# Patient Record
Sex: Female | Born: 1955
Health system: Southern US, Community
[De-identification: ages and names within clinical notes are randomized; demographics above are authoritative.]

## PROBLEM LIST (undated history)

## (undated) DIAGNOSIS — R200 Anesthesia of skin: Secondary | ICD-10-CM

## (undated) DIAGNOSIS — H269 Unspecified cataract: Secondary | ICD-10-CM

## (undated) DIAGNOSIS — I350 Nonrheumatic aortic (valve) stenosis: Secondary | ICD-10-CM

## (undated) DIAGNOSIS — I10 Essential (primary) hypertension: Secondary | ICD-10-CM

## (undated) DIAGNOSIS — E119 Type 2 diabetes mellitus without complications: Secondary | ICD-10-CM

## (undated) DIAGNOSIS — G2581 Restless legs syndrome: Secondary | ICD-10-CM

## (undated) DIAGNOSIS — R351 Nocturia: Secondary | ICD-10-CM

## (undated) DIAGNOSIS — G4700 Insomnia, unspecified: Secondary | ICD-10-CM

## (undated) DIAGNOSIS — R011 Cardiac murmur, unspecified: Secondary | ICD-10-CM

## (undated) DIAGNOSIS — E782 Mixed hyperlipidemia: Secondary | ICD-10-CM

## (undated) DIAGNOSIS — M519 Unspecified thoracic, thoracolumbar and lumbosacral intervertebral disc disorder: Secondary | ICD-10-CM

## (undated) DIAGNOSIS — R0600 Dyspnea, unspecified: Secondary | ICD-10-CM

## (undated) HISTORY — DX: Unspecified thoracic, thoracolumbar and lumbosacral intervertebral disc disorder: M51.9

## (undated) HISTORY — DX: Type 2 diabetes mellitus without complications: E11.9

## (undated) HISTORY — DX: Essential (primary) hypertension: I10

## (undated) HISTORY — DX: Unspecified cataract: H26.9

## (undated) HISTORY — DX: Restless legs syndrome: G25.81

## (undated) HISTORY — DX: Cardiac murmur, unspecified: R01.1

## (undated) HISTORY — DX: Nonrheumatic aortic (valve) stenosis: I35.0

## (undated) HISTORY — DX: Mixed hyperlipidemia: E78.2

## (undated) HISTORY — PX: CARDIAC VALVE REPLACEMENT: SHX585

## (undated) HISTORY — PX: TOE SURGERY: SHX1073

---

## 2001-04-04 ENCOUNTER — Encounter: Payer: Self-pay | Admitting: Obstetrics and Gynecology

## 2001-04-04 ENCOUNTER — Ambulatory Visit (HOSPITAL_COMMUNITY): Admission: RE | Admit: 2001-04-04 | Discharge: 2001-04-04 | Payer: Self-pay | Admitting: Obstetrics and Gynecology

## 2004-08-28 ENCOUNTER — Ambulatory Visit (HOSPITAL_COMMUNITY): Admission: RE | Admit: 2004-08-28 | Discharge: 2004-08-28 | Payer: Self-pay | Admitting: Obstetrics and Gynecology

## 2004-09-07 ENCOUNTER — Encounter: Admission: RE | Admit: 2004-09-07 | Discharge: 2004-09-07 | Payer: Self-pay | Admitting: Obstetrics and Gynecology

## 2005-03-05 HISTORY — PX: COLONOSCOPY: SHX174

## 2005-10-16 ENCOUNTER — Ambulatory Visit: Payer: Self-pay | Admitting: Internal Medicine

## 2005-10-16 ENCOUNTER — Ambulatory Visit (HOSPITAL_COMMUNITY): Admission: RE | Admit: 2005-10-16 | Discharge: 2005-10-16 | Payer: Self-pay | Admitting: Internal Medicine

## 2005-10-17 ENCOUNTER — Ambulatory Visit (HOSPITAL_COMMUNITY): Admission: RE | Admit: 2005-10-17 | Discharge: 2005-10-17 | Payer: Self-pay | Admitting: Obstetrics and Gynecology

## 2006-06-24 ENCOUNTER — Emergency Department (HOSPITAL_COMMUNITY): Admission: EM | Admit: 2006-06-24 | Discharge: 2006-06-24 | Payer: Self-pay | Admitting: Emergency Medicine

## 2006-10-21 ENCOUNTER — Encounter: Admission: RE | Admit: 2006-10-21 | Discharge: 2006-10-21 | Payer: Self-pay | Admitting: Obstetrics and Gynecology

## 2007-10-22 ENCOUNTER — Ambulatory Visit (HOSPITAL_COMMUNITY): Admission: RE | Admit: 2007-10-22 | Discharge: 2007-10-22 | Payer: Self-pay | Admitting: Family Medicine

## 2008-07-14 ENCOUNTER — Ambulatory Visit (HOSPITAL_COMMUNITY): Admission: RE | Admit: 2008-07-14 | Discharge: 2008-07-14 | Payer: Self-pay | Admitting: Family Medicine

## 2008-10-27 ENCOUNTER — Ambulatory Visit (HOSPITAL_COMMUNITY): Admission: RE | Admit: 2008-10-27 | Discharge: 2008-10-27 | Payer: Self-pay | Admitting: Family Medicine

## 2009-11-03 ENCOUNTER — Other Ambulatory Visit: Admission: RE | Admit: 2009-11-03 | Discharge: 2009-11-03 | Payer: Self-pay | Admitting: Obstetrics and Gynecology

## 2009-11-03 ENCOUNTER — Ambulatory Visit (HOSPITAL_COMMUNITY): Admission: RE | Admit: 2009-11-03 | Discharge: 2009-11-03 | Payer: Self-pay | Admitting: Obstetrics and Gynecology

## 2010-07-18 ENCOUNTER — Ambulatory Visit (HOSPITAL_COMMUNITY)
Admission: RE | Admit: 2010-07-18 | Discharge: 2010-07-18 | Disposition: A | Discharge status: Home / Self Care | Payer: BC Managed Care – PPO | Source: Ambulatory Visit | Attending: Family Medicine | Admitting: Family Medicine

## 2010-07-18 DIAGNOSIS — E119 Type 2 diabetes mellitus without complications: Secondary | ICD-10-CM | POA: Insufficient documentation

## 2010-07-18 DIAGNOSIS — I10 Essential (primary) hypertension: Secondary | ICD-10-CM | POA: Insufficient documentation

## 2010-07-18 DIAGNOSIS — R011 Cardiac murmur, unspecified: Secondary | ICD-10-CM | POA: Insufficient documentation

## 2010-07-18 DIAGNOSIS — I359 Nonrheumatic aortic valve disorder, unspecified: Secondary | ICD-10-CM

## 2010-07-21 NOTE — Op Note (Signed)
[name], [name]                ACCOUNT NO.:  [id number]   MEDICAL RECORD NO.:  [id number]          PATIENT TYPE:  AMB   LOCATION:  DAY                           FACILITY:  [facility]   PHYSICIAN:  R. Roetta Sessions, M.D. DATE OF BIRTH:  [birth year]   DATE OF PROCEDURE:  10/16/2005  DATE OF DISCHARGE:                                 OPERATIVE REPORT   PROCEDURE:  Screening colonoscopy.   INDICATIONS FOR PROCEDURE:  The patient is a 55 year old Caucasian female  with no lower GI tract symptoms, sent over courtesy of Dr. Lilyan Punt for  colorectal cancer screening.  She is devoid of any, as stated above, lower  GI tract symptoms.  There is no family history of colorectal neoplasia.  She  has never had her lower GI tract imaged.  Colonoscopy is now being done as a  screening maneuver.  This procedure has been discussed with the patient at  length.  The potential risks, benefits, and alternatives have been reviewed.  Questions answered.  She is agreeable.  Please see documentation in the  medical record.   PROCEDURE NOTE:  O2 saturation, blood pressure, pulse and respiratory rate  monitored throughout the entire procedure.   CONSCIOUS SEDATION:  1. Versed 4 mg IV.  2. Demerol 75 mg IV in divided doses.   INSTRUMENT:  Olympus video chip system.   FINDINGS:  Digital exam revealed no abnormalities.  Endoscopic findings:  Prep was good.  Rectum:  Examination of the rectum mucosa including  retroflexed view of the anal verge revealed only a single anal papilla.  Rectal mucosa appeared normal.   Colonic mucosa was surveyed from the rectosigmoid junction, through the  left, transverse, and right colon, to the appendiceal orifice, ileocecal  valve, and cecum.  These structures were well seen and photographed for the  record.  From this level, the scope was slowly withdrawn.  All previously  mentioned mucosal surfaces were again seen.  The colonic mucosa was well  seen and appeared normal.   The patient tolerated the procedure well and was  reactivated.   ENDOSCOPIC IMPRESSION:  1. Single anal papilla.  Otherwise normal rectum.  2. Normal colon.   RECOMMENDATIONS:  Repeat colonoscopy in 10 years.      Jonathon Bellows, M.D.  Electronically Signed     RMR/MEDQ  D:  10/16/2005  T:  10/16/2005  Job:  [id number]   cc:   Lorin Picket A. Gerda Diss, MD  Fax: [phone]

## 2010-08-31 ENCOUNTER — Ambulatory Visit: Payer: BC Managed Care – PPO | Admitting: Cardiology

## 2010-09-05 DIAGNOSIS — E119 Type 2 diabetes mellitus without complications: Secondary | ICD-10-CM | POA: Insufficient documentation

## 2010-09-05 DIAGNOSIS — M519 Unspecified thoracic, thoracolumbar and lumbosacral intervertebral disc disorder: Secondary | ICD-10-CM

## 2010-09-05 DIAGNOSIS — E785 Hyperlipidemia, unspecified: Secondary | ICD-10-CM | POA: Insufficient documentation

## 2010-09-05 DIAGNOSIS — I350 Nonrheumatic aortic (valve) stenosis: Secondary | ICD-10-CM | POA: Insufficient documentation

## 2010-09-05 DIAGNOSIS — I10 Essential (primary) hypertension: Secondary | ICD-10-CM | POA: Insufficient documentation

## 2010-09-05 DIAGNOSIS — G2581 Restless legs syndrome: Secondary | ICD-10-CM

## 2010-09-11 ENCOUNTER — Encounter: Payer: Self-pay | Admitting: Cardiology

## 2010-09-11 ENCOUNTER — Ambulatory Visit (INDEPENDENT_AMBULATORY_CARE_PROVIDER_SITE_OTHER): Payer: BC Managed Care – PPO | Admitting: Cardiology

## 2010-09-11 VITALS — BP 156/88 | HR 92 | Ht 67.0 in | Wt 242.0 lb

## 2010-09-11 DIAGNOSIS — I10 Essential (primary) hypertension: Secondary | ICD-10-CM

## 2010-09-11 DIAGNOSIS — E782 Mixed hyperlipidemia: Secondary | ICD-10-CM

## 2010-09-11 DIAGNOSIS — I359 Nonrheumatic aortic valve disorder, unspecified: Secondary | ICD-10-CM

## 2010-09-11 DIAGNOSIS — E119 Type 2 diabetes mellitus without complications: Secondary | ICD-10-CM

## 2010-09-11 NOTE — Progress Notes (Signed)
Clinical Summary [name] is a 55 y.o.female referred for cardiology consultation by Dr. Gerda Diss. She was seen for a routine visit with finding of cardiac murmur, subsequent echocardiogram was obtained. Study results reviewed showing evidence of a functionally bicuspid aortic valve with mild aortic stenosis, no aortic regurgitation, and mean gradient of 22 mm mercury. LVEF was normal at 55-60% with normal chamber size. I discussed this in detail with the patient today.  From a symptom perspective, [name] does not endorse any exertional chest pain or dyspnea beyond NYHA class II. She does not exercise regularly, but states that she stays active with a catering business, also 5 grandchildren. She has no history of palpitations or syncope.  Today we discussed the general pathophysiology and expected natural history of aortic stenosis. We also discussed her cardiac risk factor profile, and potential lifestyle modifications.  No Known Allergies  Current outpatient prescriptions:glyBURIDE (DIABETA) 5 MG tablet, Take 5 mg by mouth 2 (two) times daily with a meal.  , Disp: , Rfl: ;  lisinopril-hydrochlorothiazide (PRINZIDE,ZESTORETIC) 20-25 MG per tablet, Take 1 tablet by mouth daily. , Disp: , Rfl: ;  LORazepam (ATIVAN) 0.5 MG tablet, Take 0.5 mg by mouth every 8 (eight) hours.  , Disp: , Rfl: ;  pravastatin (PRAVACHOL) 20 MG tablet, Take 20 mg by mouth daily.  , Disp: , Rfl:  sitaGLIPtan-metformin (JANUMET) 50-1000 MG per tablet, Take 1 tablet by mouth 2 (two) times daily with a meal.  , Disp: , Rfl: ;  DISCONTD: lisinopril (PRINIVIL,ZESTRIL) 20 MG tablet, Take 20 mg by mouth daily.  , Disp: , Rfl: ;  DISCONTD: metFORMIN (GLUCOPHAGE) 1000 MG tablet, Take 1,000 mg by mouth 2 (two) times daily with a meal.  , Disp: , Rfl: ;  DISCONTD: pioglitazone (ACTOS) 30 MG tablet, Take 30 mg by mouth daily.  , Disp: , Rfl:   Past Medical History  Diagnosis Date  . Aortic stenosis     Mild 5/12  . Type 2 diabetes  mellitus   . Essential hypertension, benign   . Mixed hyperlipidemia   . Restless leg syndrome   . Ruptured disk     Past Surgical History  Procedure Date  . Colonoscopy     Family History  Problem Relation Age of Onset  . Coronary artery disease Father     Diagnosed in his 58s  . Diabetes type II Mother   . Diabetes type II Brother     Social History [name] reports that she has never smoked. She has never used smokeless tobacco. [name] reports that she does not drink alcohol.  Review of Systems Stable appetite. No orthopnea or PND. No reported melena or hematochezia. No lower extremity edema. No claudication. Otherwise negative.  Physical Examination Filed Vitals:   09/11/10 0846  BP: 156/88  Pulse: 92  Obese woman in no acute distress. HEENT: Conjunctiva and lids normal, oropharynx with moist mucosa. Neck: Supple, no elevated JVP or carotid bruits, no thyromegaly. Lungs: Clear to auscultation, nonlabored. Cardiac: Regular rate and rhythm with 2-3/6 systolic murmur heard best at the right base, preserved second heart sound, no diastolic murmur or ejection click. No S3. No rub. Abdomen: Soft, nontender, bowel sounds present. Skin: Warm and dry. Musculoskeletal: No kyphosis. Extremities: No pitting edema, distal pulses full. Neuropsychiatric: Alert and oriented x3, affect appropriate.   ECG Normal sinus rhythm at 86 beats per minute.  Studies Echocardiogram 07/18/2010: - Left ventricle: The cavity size was normal. There was mild  concentric hypertrophy. Systolic function was normal. The     estimated ejection fraction was in the range of 55% to 60%. Wall     motion was normal; there were no regional wall motion     abnormalities.   - Aortic valve: There was mild stenosis. Valve area: 1.28cm^2(VTI).     Valve area: 1.38cm^2 (Vmax).   - Left atrium: The atrium was mildly dilated.   - Atrial septum: No defect or patent foramen ovale was  identified.   Problem List and Plan

## 2010-09-11 NOTE — Assessment & Plan Note (Signed)
Followed by Dr. Luking 

## 2010-09-11 NOTE — Assessment & Plan Note (Signed)
Mild with functionally bicuspid valve by recent echocardiogram. No significant symptomatology at this point. We discussed the natural history, also risk factor modification strategies and exercise. Followup echocardiogram with clinical visit scheduled for one year.

## 2010-09-11 NOTE — Assessment & Plan Note (Signed)
Blood pressure elevated today. She is on medical therapy followed by Dr. Gerda Diss. Recommended general diet and exercise, sodium restriction.

## 2010-09-11 NOTE — Patient Instructions (Signed)
Your physician has requested that you have an echocardiogram. Echocardiography is a painless test that uses sound waves to create images of your heart. It provides your doctor with information about the size and shape of your heart and how well your heart's chambers and valves are working. This procedure takes approximately one hour. There are no restrictions for this procedure.  Your physician recommends that you continue on your current medications as directed. Please refer to the Current Medication list given to you today.  Your physician recommends that you schedule a follow-up appointment in: 1 year

## 2010-10-12 ENCOUNTER — Encounter: Payer: Self-pay | Admitting: Cardiology

## 2010-10-24 ENCOUNTER — Other Ambulatory Visit: Payer: Self-pay | Admitting: Obstetrics and Gynecology

## 2010-10-24 DIAGNOSIS — Z139 Encounter for screening, unspecified: Secondary | ICD-10-CM

## 2010-11-07 ENCOUNTER — Ambulatory Visit (HOSPITAL_COMMUNITY)
Admission: RE | Admit: 2010-11-07 | Discharge: 2010-11-07 | Disposition: A | Discharge status: Home / Self Care | Payer: BC Managed Care – PPO | Source: Ambulatory Visit | Attending: Obstetrics and Gynecology | Admitting: Obstetrics and Gynecology

## 2010-11-07 DIAGNOSIS — Z1231 Encounter for screening mammogram for malignant neoplasm of breast: Secondary | ICD-10-CM | POA: Insufficient documentation

## 2010-11-07 DIAGNOSIS — Z139 Encounter for screening, unspecified: Secondary | ICD-10-CM

## 2011-09-20 ENCOUNTER — Ambulatory Visit (INDEPENDENT_AMBULATORY_CARE_PROVIDER_SITE_OTHER): Payer: BC Managed Care – PPO | Admitting: Cardiology

## 2011-09-20 ENCOUNTER — Encounter: Payer: Self-pay | Admitting: Cardiology

## 2011-09-20 VITALS — BP 112/74 | HR 111 | Ht 66.0 in | Wt 234.0 lb

## 2011-09-20 DIAGNOSIS — I350 Nonrheumatic aortic (valve) stenosis: Secondary | ICD-10-CM

## 2011-09-20 DIAGNOSIS — I359 Nonrheumatic aortic valve disorder, unspecified: Secondary | ICD-10-CM

## 2011-09-20 NOTE — Patient Instructions (Addendum)
Your physician has requested that you have an echocardiogram 09/2012 dx aortic stenosis. Echocardiography is a painless test that uses sound waves to create images of your heart. It provides your doctor with information about the size and shape of your heart and how well your heart's chambers and valves are working. This procedure takes approximately one hour. There are no restrictions for this procedure.  Your physician wants you to follow-up in: 1 year with Dr. Diona Browner. You will receive a reminder letter in the mail two months in advance. If you don't receive a letter, please call our office to schedule the follow-up appointment.  NO CHANGES WERE MADE TODAY

## 2011-09-20 NOTE — Progress Notes (Signed)
   Clinical Summary [name] is a 56 y.o.female presenting for followup. She was seen in July of 2012. She reports no progressive dyspnea, palpitations, or chest pain. Last echocardiogram reviewed from 2012.  She is now on insulin in addition to her regular medications. Keeps followup with Dr. Gerda Diss.  We decided to hold off until next year to repeat an echocardiogram.   No Known Allergies  Current Outpatient Prescriptions  Medication Sig Dispense Refill  . ACCU-CHEK AVIVA PLUS test strip       . ACCU-CHEK FASTCLIX LANCETS MISC       . B-D UF III MINI PEN NEEDLES 31G X 5 MM MISC       . glyBURIDE (DIABETA) 5 MG tablet Take 5 mg by mouth 2 (two) times daily with a meal.        . LANTUS SOLOSTAR 100 UNIT/ML injection 12 Units at bedtime.      Marland Kitchen lisinopril (PRINIVIL,ZESTRIL) 20 MG tablet Take 1 tablet by mouth Daily.      Marland Kitchen LORazepam (ATIVAN) 0.5 MG tablet Take 0.5 mg by mouth every 8 (eight) hours.        . metFORMIN (GLUCOPHAGE) 1000 MG tablet Take 1 tablet by mouth Twice daily.      . pravastatin (PRAVACHOL) 20 MG tablet Take 20 mg by mouth daily.          Past Medical History  Diagnosis Date  . Aortic stenosis     Mild 5/12  . Type 2 diabetes mellitus   . Essential hypertension, benign   . Mixed hyperlipidemia   . Restless leg syndrome   . Ruptured disk     Social History [name] reports that she has never smoked. She has never used smokeless tobacco. [name] reports that she does not drink alcohol.  Review of Systems Negative except as outlined above.  Physical Examination Filed Vitals:   09/20/11 1445  BP: 112/74  Pulse: 111    Obese woman in no acute distress.  HEENT: Conjunctiva and lids normal, oropharynx with moist mucosa.  Neck: Supple, no elevated JVP or carotid bruits, no thyromegaly.  Lungs: Clear to auscultation, nonlabored.  Cardiac: Regular rate and rhythm with 2-3/6 systolic murmur heard best at the right base, preserved second heart sound,  no diastolic murmur or ejection click. No S3. No rub.  Abdomen: Soft, nontender, bowel sounds present.   Extremities: No pitting edema, distal pulses full.    ECG Sinus tachycardia, no significant ST changes.  Problem List and Plan   No problem-specific assessment & plan notes found for this encounter.   Jonelle Sidle, M.D., F.A.C.C.

## 2011-09-20 NOTE — Assessment & Plan Note (Signed)
Mild with functionally bicuspid aortic valve. No progressive symptoms since last visit and exam is stable. We defer repeat echocardiogram to next visit in a year.

## 2011-12-10 ENCOUNTER — Other Ambulatory Visit: Payer: Self-pay | Admitting: Family Medicine

## 2011-12-10 DIAGNOSIS — Z139 Encounter for screening, unspecified: Secondary | ICD-10-CM

## 2011-12-13 ENCOUNTER — Ambulatory Visit (HOSPITAL_COMMUNITY)
Admission: RE | Admit: 2011-12-13 | Discharge: 2011-12-13 | Disposition: A | Discharge status: Home / Self Care | Payer: BC Managed Care – PPO | Source: Ambulatory Visit | Attending: Family Medicine | Admitting: Family Medicine

## 2011-12-13 DIAGNOSIS — Z1231 Encounter for screening mammogram for malignant neoplasm of breast: Secondary | ICD-10-CM | POA: Insufficient documentation

## 2011-12-13 DIAGNOSIS — Z139 Encounter for screening, unspecified: Secondary | ICD-10-CM

## 2012-03-20 ENCOUNTER — Encounter (HOSPITAL_COMMUNITY): Payer: Self-pay | Admitting: *Deleted

## 2012-03-20 ENCOUNTER — Emergency Department (HOSPITAL_COMMUNITY)
Admission: EM | Admit: 2012-03-20 | Discharge: 2012-03-20 | Disposition: A | Discharge status: Home / Self Care | Payer: BC Managed Care – PPO | Attending: Emergency Medicine | Admitting: Emergency Medicine

## 2012-03-20 DIAGNOSIS — K122 Cellulitis and abscess of mouth: Secondary | ICD-10-CM

## 2012-03-20 DIAGNOSIS — R05 Cough: Secondary | ICD-10-CM | POA: Insufficient documentation

## 2012-03-20 DIAGNOSIS — Z8679 Personal history of other diseases of the circulatory system: Secondary | ICD-10-CM | POA: Insufficient documentation

## 2012-03-20 DIAGNOSIS — E785 Hyperlipidemia, unspecified: Secondary | ICD-10-CM | POA: Insufficient documentation

## 2012-03-20 DIAGNOSIS — J069 Acute upper respiratory infection, unspecified: Secondary | ICD-10-CM

## 2012-03-20 DIAGNOSIS — Z8739 Personal history of other diseases of the musculoskeletal system and connective tissue: Secondary | ICD-10-CM | POA: Insufficient documentation

## 2012-03-20 DIAGNOSIS — R6883 Chills (without fever): Secondary | ICD-10-CM | POA: Insufficient documentation

## 2012-03-20 DIAGNOSIS — I10 Essential (primary) hypertension: Secondary | ICD-10-CM | POA: Insufficient documentation

## 2012-03-20 DIAGNOSIS — E119 Type 2 diabetes mellitus without complications: Secondary | ICD-10-CM | POA: Insufficient documentation

## 2012-03-20 DIAGNOSIS — Z79899 Other long term (current) drug therapy: Secondary | ICD-10-CM | POA: Insufficient documentation

## 2012-03-20 DIAGNOSIS — R059 Cough, unspecified: Secondary | ICD-10-CM | POA: Insufficient documentation

## 2012-03-20 DIAGNOSIS — J029 Acute pharyngitis, unspecified: Secondary | ICD-10-CM | POA: Insufficient documentation

## 2012-03-20 DIAGNOSIS — R51 Headache: Secondary | ICD-10-CM | POA: Insufficient documentation

## 2012-03-20 MED ORDER — ONDANSETRON HCL 4 MG PO TABS
4.0000 mg | ORAL_TABLET | Freq: Once | ORAL | Status: AC
  Administered 2012-03-20: 4 mg via ORAL
  Filled 2012-03-20: qty 1

## 2012-03-20 MED ORDER — AMOXICILLIN 250 MG PO CAPS
500.0000 mg | ORAL_CAPSULE | Freq: Once | ORAL | Status: AC
  Administered 2012-03-20: 500 mg via ORAL
  Filled 2012-03-20: qty 2

## 2012-03-20 NOTE — ED Notes (Signed)
Pt with N/V, after vomiting pt feels as if there is a lump to back of her throat and makes pt gag every once in awhile

## 2012-03-20 NOTE — ED Provider Notes (Signed)
History     CSN: [id number]  Arrival date & time 03/20/12  2043   First MD Initiated Contact with Patient 03/20/12 2151      Chief Complaint  Patient presents with  . Nausea  . Emesis  . Sore Throat    (Consider location/radiation/quality/duration/timing/severity/associated sxs/prior treatment) Patient is a 57 y.o. female presenting with vomiting and pharyngitis. The history is provided by the patient.  Emesis  This is a new problem. The current episode started more than 2 days ago. The problem occurs 2 to 4 times per day. The problem has not changed since onset.The emesis has an appearance of stomach contents. Associated symptoms include chills, cough and headaches. Pertinent negatives include no abdominal pain, no arthralgias, no diarrhea, no fever and no myalgias. Risk factors include ill contacts.  Sore Throat Associated symptoms include chills, coughing, headaches and vomiting. Pertinent negatives include no abdominal pain, arthralgias, chest pain, fever, myalgias or neck pain.    Past Medical History  Diagnosis Date  . Aortic stenosis     Mild 5/12  . Type 2 diabetes mellitus   . Essential hypertension, benign   . Mixed hyperlipidemia   . Restless leg syndrome   . Ruptured disk     Past Surgical History  Procedure Date  . Colonoscopy     Family History  Problem Relation Age of Onset  . Coronary artery disease Father     Diagnosed in his 39s  . Diabetes type II Mother   . Diabetes type II Brother     History  Substance Use Topics  . Smoking status: Never Smoker   . Smokeless tobacco: Never Used  . Alcohol Use: No    OB History    Grav Para Term Preterm Abortions TAB SAB Ect Mult Living                  Review of Systems  Constitutional: Positive for chills. Negative for fever and activity change.       All ROS Neg except as noted in HPI  HENT: Negative for nosebleeds and neck pain.   Eyes: Negative for photophobia and discharge.  Respiratory:  Positive for cough. Negative for shortness of breath and wheezing.   Cardiovascular: Negative for chest pain and palpitations.  Gastrointestinal: Positive for vomiting. Negative for abdominal pain, diarrhea and blood in stool.  Genitourinary: Negative for dysuria, frequency and hematuria.  Musculoskeletal: Negative for myalgias, back pain and arthralgias.  Skin: Negative.   Neurological: Positive for headaches. Negative for dizziness, seizures and speech difficulty.  Psychiatric/Behavioral: Negative for hallucinations and confusion.    Allergies  Review of patient's allergies indicates no known allergies.  Home Medications   Current Outpatient Rx  Name  Route  Sig  Dispense  Refill  . ACCU-CHEK AVIVA PLUS VI STRP               . ACCU-CHEK FASTCLIX LANCETS MISC               . BD PEN NEEDLE MINI U/F 31G X 5 MM MISC               . GLYBURIDE 5 MG PO TABS   Oral   Take 5 mg by mouth 2 (two) times daily with a meal.           . LANTUS SOLOSTAR 100 UNIT/ML [place] SOLN      12 Units at bedtime.         Marland Kitchen LISINOPRIL 20  MG PO TABS   Oral   Take 1 tablet by mouth Daily.         Marland Kitchen LORAZEPAM 0.5 MG PO TABS   Oral   Take 0.5 mg by mouth every 8 (eight) hours.           Marland Kitchen METFORMIN HCL 1000 MG PO TABS   Oral   Take 1 tablet by mouth Twice daily.         Marland Kitchen PRAVASTATIN SODIUM 20 MG PO TABS   Oral   Take 20 mg by mouth daily.             BP 181/82  Pulse 106  Temp 98.2 F (36.8 C) (Oral)  Resp 18  Ht 5\' 6"  (1.676 m)  Wt 229 lb (103.874 kg)  BMI 36.96 kg/m2  SpO2 100%  Physical Exam  Nursing note and vitals reviewed. Constitutional: She is oriented to person, place, and time. She appears well-developed and well-nourished.  Non-toxic appearance.  HENT:  Head: Normocephalic.  Right Ear: Tympanic membrane and external ear normal.  Left Ear: Tympanic membrane and external ear normal.       The uvula is enlarged and extending at the back of the throat.  The airway is patent. Patient speaks in clear complete sentences. There is mild increased redness of the posterior pharynx. No exudate or pus pockets noted. No abscess appreciated. Nasal congestion present.  Eyes: EOM and lids are normal. Pupils are equal, round, and reactive to light.  Neck: Normal range of motion. Neck supple. Carotid bruit is not present.  Cardiovascular: Normal rate, regular rhythm, normal heart sounds, intact distal pulses and normal pulses.   Pulmonary/Chest: Breath sounds normal. No respiratory distress.  Abdominal: Soft. Bowel sounds are normal. There is no tenderness. There is no guarding.  Musculoskeletal: Normal range of motion.  Lymphadenopathy:       Head (right side): No submandibular adenopathy present.       Head (left side): No submandibular adenopathy present.    She has no cervical adenopathy.  Neurological: She is alert and oriented to person, place, and time. She has normal strength. No cranial nerve deficit or sensory deficit.  Skin: Skin is warm and dry.  Psychiatric: She has a normal mood and affect. Her speech is normal.    ED Course  Procedures (including critical care time)  Labs Reviewed - No data to display No results found.   No diagnosis found.    MDM  I have reviewed nursing notes, vital signs, and all appropriate lab and imaging results for this patient. Patient has been treated for upper respiratory infection. She has been having problems with nasal congestion cough and fever. Most recently she has been noticing a" lump in the back of her throat" this is been causing her to gag and at times even have vomiting episodes. The examination is consistent with uvulitis. The patient is advised to use salt water gargles and to finish her Levaquin in the event there is a bacterial component to this. Patient is advised to return to the emergency department if any changes or problems.       Kathie Dike, [place] 03/20/12 2237

## 2012-03-21 NOTE — ED Provider Notes (Signed)
Medical screening examination/treatment/procedure(s) were performed by non-physician practitioner and as supervising physician I was immediately available for consultation/collaboration.  Geoffery Lyons, MD 03/21/12 0010

## 2012-09-04 ENCOUNTER — Ambulatory Visit (HOSPITAL_COMMUNITY)
Admission: RE | Admit: 2012-09-04 | Discharge: 2012-09-04 | Disposition: A | Discharge status: Home / Self Care | Payer: BC Managed Care – PPO | Source: Ambulatory Visit | Attending: Cardiology | Admitting: Cardiology

## 2012-09-04 DIAGNOSIS — I10 Essential (primary) hypertension: Secondary | ICD-10-CM | POA: Insufficient documentation

## 2012-09-04 DIAGNOSIS — I517 Cardiomegaly: Secondary | ICD-10-CM

## 2012-09-04 DIAGNOSIS — I350 Nonrheumatic aortic (valve) stenosis: Secondary | ICD-10-CM

## 2012-09-04 DIAGNOSIS — I359 Nonrheumatic aortic valve disorder, unspecified: Secondary | ICD-10-CM | POA: Insufficient documentation

## 2012-09-04 DIAGNOSIS — E119 Type 2 diabetes mellitus without complications: Secondary | ICD-10-CM | POA: Insufficient documentation

## 2012-09-04 NOTE — Progress Notes (Signed)
*  PRELIMINARY RESULTS* Echocardiogram 2D Echocardiogram has been performed.  Conrad [place] 09/04/2012, 11:10 AM

## 2012-09-04 NOTE — Progress Notes (Deleted)
*  PRELIMINARY RESULTS* Echocardiogram 2D Echocardiogram has been performed.  Conrad [place] 09/04/2012, 8:54 AM

## 2012-09-10 ENCOUNTER — Ambulatory Visit (INDEPENDENT_AMBULATORY_CARE_PROVIDER_SITE_OTHER): Payer: BC Managed Care – PPO | Admitting: Cardiology

## 2012-09-10 ENCOUNTER — Encounter: Payer: Self-pay | Admitting: Cardiology

## 2012-09-10 VITALS — BP 150/78 | HR 88 | Ht 66.0 in | Wt 236.4 lb

## 2012-09-10 DIAGNOSIS — I10 Essential (primary) hypertension: Secondary | ICD-10-CM

## 2012-09-10 DIAGNOSIS — I350 Nonrheumatic aortic (valve) stenosis: Secondary | ICD-10-CM

## 2012-09-10 DIAGNOSIS — I719 Aortic aneurysm of unspecified site, without rupture: Secondary | ICD-10-CM

## 2012-09-10 DIAGNOSIS — I359 Nonrheumatic aortic valve disorder, unspecified: Secondary | ICD-10-CM

## 2012-09-10 NOTE — Progress Notes (Signed)
Clinical Summary Ms. Freeburg is a 57 y.o.female last seen in July 2013. She is here for followup of aortic stenosis, reports no progressive exertional symptoms, NYHA class II dyspnea, no chest pain.  Recent followup echocardiogram demonstrated moderate LVH with LVEF 65-70%, grade 1 diastolic dysfunction. Dr. Charlott Rakes report indicates the aortic valve was difficult to visualize, peak and mean gradients of 78 and 44 mm respectively suggests severe aortic stenosis which would be a dramatic change from her last study. I did review the images, aortic valve is consistent with bicuspid morphology with eccentric leaflet closure plain and calcification. The gradients are increased in range reported, however visual inspection of leaflet opening appears not in the severe range. Moderate stenosis suggested by planimetry. It could be that the increased gradients are a reflection of dilatation within the ascending aorta and also reduced pressure recovery within the aorta in the setting of eccentric flow across the valve. No obvious subaortic membrane was visualized or stenosis within the ascending aorta.  Today we discussed the results of her recent echocardiogram and also the implications. Her ECG shows normal sinus rhythm. She clarifies that she has had no significant change in overall functional capacity. We discussed proceeding with a TEE to further evaluate the aortic valve and exclude other associated findings. After discussion she opted for observation over the next 6 months. At that time we will follow up with a complete echocardiogram, and most likely a TEE.  No Known Allergies  Current Outpatient Prescriptions  Medication Sig Dispense Refill  . glyBURIDE (DIABETA) 5 MG tablet Take 10 mg by mouth 2 (two) times daily with a meal.       . LANTUS SOLOSTAR 100 UNIT/ML injection Inject 20 Units into the skin at bedtime.       Marland Kitchen lisinopril-hydrochlorothiazide (PRINZIDE,ZESTORETIC) 20-25 MG per tablet Take 1  tablet by mouth at bedtime.      Marland Kitchen LORazepam (ATIVAN) 0.5 MG tablet Take 0.5 mg by mouth daily as needed. For restless legs      . metFORMIN (GLUCOPHAGE) 1000 MG tablet Take 1 tablet by mouth Twice daily.      . pravastatin (PRAVACHOL) 20 MG tablet Take 20 mg by mouth at bedtime.       Marland Kitchen levofloxacin (LEVAQUIN) 500 MG tablet Take 500 mg by mouth daily. For 7 days       No current facility-administered medications for this visit.    Past Medical History  Diagnosis Date  . Aortic stenosis     Mild 5/12  . Type 2 diabetes mellitus   . Essential hypertension, benign   . Mixed hyperlipidemia   . Restless leg syndrome   . Ruptured disk     Past Surgical History  Procedure Laterality Date  . Colonoscopy      Family History  Problem Relation Age of Onset  . Coronary artery disease Father     Diagnosed in his 51s  . Diabetes type II Mother   . Diabetes type II Brother     Social History Ms. Burkhalter reports that she has never smoked. She has never used smokeless tobacco. Ms. Mini reports that she does not drink alcohol.  Review of Systems No palpitations or syncope. Stable appetite. No orthopnea or PND.  Physical Examination Filed Vitals:   09/10/12 1441  BP: 150/78  Pulse: 88   Filed Weights   09/10/12 1441  Weight: 236 lb 6.4 oz (107.23 kg)    Obese woman in no acute distress.  HEENT: Conjunctiva  and lids normal, oropharynx with moist mucosa.  Neck: Supple, no elevated JVP or carotid bruits, no thyromegaly.  Lungs: Clear to auscultation, nonlabored.  Cardiac: Regular rate and rhythm with 2-3/6 systolic murmur heard best at the right base, preserved second heart sound, no diastolic murmur or ejection click. No S3. No rub.  Abdomen: Soft, nontender, bowel sounds present.  Extremities: No pitting edema, distal pulses full.    Problem List and Plan   Aortic stenosis Probable bicuspid aortic valve as outlined associated with what looks to be mild dilatation of the  ascending aorta based on limited images. The recent echocardiogram does suggest progression in degree of aortic stenosis, although visual inspection of the valve and actual gradients are not clearly concordant. As noted above, the increased gradients may have other explanations, and valve planimetry looks to be perhaps more in the moderate range overall. She does not report any decline in functional capacity or obvious exertional symptoms on a regular basis. We did discuss proceeding with a TEE, however she prefers observation at this point, followup planned in 6 months with a repeat echocardiogram. Also would like to go ahead and get a CT angiogram of the chest to better size her aorta and exclude aneurysmal disease.  Hypertension Blood pressure elevated today. Repeat regular followup with Dr. Gerda Diss.    Jonelle Sidle, M.D., F.A.C.C.

## 2012-09-10 NOTE — Patient Instructions (Addendum)
Your physician recommends that you schedule a follow-up appointment in: 6 months  Your physician has requested that you have an echocardiogram. Echocardiography is a painless test that uses sound waves to create images of your heart. It provides your doctor with information about the size and shape of your heart and how well your heart's chambers and valves are working. This procedure takes approximately one hour. There are no restrictions for this procedure.PRIOR TO 6 MONTH FOLLOW UP A staff member from our office will alert you the with appointment date and time, once available  Non-Cardiac CT Angiography (CTA), is a special type of CT scan that uses a computer to produce multi-dimensional views of major blood vessels throughout the body. In CT angiography, a contrast material is injected through an IV to help visualize the blood vessels

## 2012-09-10 NOTE — Assessment & Plan Note (Signed)
Blood pressure elevated today. Repeat regular followup with Dr. Gerda Diss.

## 2012-09-10 NOTE — Addendum Note (Signed)
Addended by: Derry Lory A on: 09/10/2012 03:30 PM   Modules accepted: Orders

## 2012-09-10 NOTE — Assessment & Plan Note (Signed)
Probable bicuspid aortic valve as outlined associated with what looks to be mild dilatation of the ascending aorta based on limited images. The recent echocardiogram does suggest progression in degree of aortic stenosis, although visual inspection of the valve and actual gradients are not clearly concordant. As noted above, the increased gradients may have other explanations, and valve planimetry looks to be perhaps more in the moderate range overall. She does not report any decline in functional capacity or obvious exertional symptoms on a regular basis. We did discuss proceeding with a TEE, however she prefers observation at this point, followup planned in 6 months with a repeat echocardiogram. Also would like to go ahead and get a CT angiogram of the chest to better size her aorta and exclude aneurysmal disease.

## 2012-09-11 LAB — BASIC METABOLIC PANEL
BUN: 24 mg/dL — ABNORMAL HIGH (ref 6–23)
Chloride: 101 mEq/L (ref 96–112)
Glucose, Bld: 143 mg/dL — ABNORMAL HIGH (ref 70–99)
Potassium: 4.4 mEq/L (ref 3.5–5.3)

## 2012-09-12 ENCOUNTER — Ambulatory Visit (HOSPITAL_COMMUNITY)
Admission: RE | Admit: 2012-09-12 | Discharge: 2012-09-12 | Disposition: A | Discharge status: Home / Self Care | Payer: BC Managed Care – PPO | Source: Ambulatory Visit | Attending: Cardiology | Admitting: Cardiology

## 2012-09-12 ENCOUNTER — Encounter: Payer: Self-pay | Admitting: *Deleted

## 2012-09-12 DIAGNOSIS — Z1389 Encounter for screening for other disorder: Secondary | ICD-10-CM | POA: Insufficient documentation

## 2012-09-12 DIAGNOSIS — I719 Aortic aneurysm of unspecified site, without rupture: Secondary | ICD-10-CM

## 2012-09-12 DIAGNOSIS — I359 Nonrheumatic aortic valve disorder, unspecified: Secondary | ICD-10-CM | POA: Insufficient documentation

## 2012-09-12 DIAGNOSIS — I350 Nonrheumatic aortic (valve) stenosis: Secondary | ICD-10-CM

## 2012-09-12 DIAGNOSIS — E041 Nontoxic single thyroid nodule: Secondary | ICD-10-CM | POA: Insufficient documentation

## 2012-09-12 MED ORDER — IOHEXOL 350 MG/ML SOLN
100.0000 mL | Freq: Once | INTRAVENOUS | Status: AC | PRN
  Administered 2012-09-12: 100 mL via INTRAVENOUS

## 2012-09-12 NOTE — Progress Notes (Signed)
Her CT chest Test shows thyroid nodule, needs [place] with f/u OV, routine.

## 2012-09-15 ENCOUNTER — Ambulatory Visit (INDEPENDENT_AMBULATORY_CARE_PROVIDER_SITE_OTHER): Payer: BC Managed Care – PPO | Admitting: Family Medicine

## 2012-09-15 ENCOUNTER — Encounter: Payer: Self-pay | Admitting: Family Medicine

## 2012-09-15 VITALS — BP 118/84 | HR 80 | Wt 239.6 lb

## 2012-09-15 DIAGNOSIS — G2581 Restless legs syndrome: Secondary | ICD-10-CM

## 2012-09-15 DIAGNOSIS — N289 Disorder of kidney and ureter, unspecified: Secondary | ICD-10-CM | POA: Insufficient documentation

## 2012-09-15 DIAGNOSIS — E041 Nontoxic single thyroid nodule: Secondary | ICD-10-CM

## 2012-09-15 DIAGNOSIS — E785 Hyperlipidemia, unspecified: Secondary | ICD-10-CM | POA: Insufficient documentation

## 2012-09-15 DIAGNOSIS — E119 Type 2 diabetes mellitus without complications: Secondary | ICD-10-CM

## 2012-09-15 DIAGNOSIS — I10 Essential (primary) hypertension: Secondary | ICD-10-CM

## 2012-09-15 DIAGNOSIS — Z79899 Other long term (current) drug therapy: Secondary | ICD-10-CM

## 2012-09-15 MED ORDER — LISINOPRIL-HYDROCHLOROTHIAZIDE 20-25 MG PO TABS: 1.0000 | ORAL_TABLET | Freq: Every day | ORAL | Status: DC

## 2012-09-15 MED ORDER — LORAZEPAM 0.5 MG PO TABS: 0.5000 mg | ORAL_TABLET | Freq: Every day | ORAL | Status: DC | PRN

## 2012-09-15 MED ORDER — GLYBURIDE 5 MG PO TABS: 10.0000 mg | ORAL_TABLET | Freq: Two times a day (BID) | ORAL | Status: DC

## 2012-09-15 MED ORDER — METFORMIN HCL 1000 MG PO TABS: 1000.0000 mg | ORAL_TABLET | Freq: Two times a day (BID) | ORAL | Status: DC

## 2012-09-15 MED ORDER — PRAVASTATIN SODIUM 20 MG PO TABS: 20.0000 mg | ORAL_TABLET | Freq: Every day | ORAL | Status: DC

## 2012-09-15 NOTE — Progress Notes (Signed)
  Subjective:    Patient ID: [name], female    DOB: 1955/06/24, 57 y.o.   MRN: [id number]  Diabetes She presents for her follow-up diabetic visit. She has type 2 diabetes mellitus. Her disease course has been stable. There are no hypoglycemic associated symptoms. Pertinent negatives for hypoglycemia include no headaches. There are no diabetic associated symptoms. Pertinent negatives for diabetes include no chest pain, no fatigue and no weakness. There are no hypoglycemic complications. Symptoms are stable. There are no diabetic complications. There are no known risk factors for coronary artery disease. Current diabetic treatment includes insulin injections and oral agent (dual therapy). She is compliant with treatment all of the time. Her weight is stable. She is following a generally healthy diet. When asked about meal planning, she reported none.  Patient states that she had a CT scan done recently and a nodule was spotted on her thyroid. She states that she was advised to follow up with her PCP concerning this issue. Patient has aortic stenosis she also has diabetes hyperlipidemia renal insufficiency. We talked at length about proper diet activity talked at length about thyroid nodule on the proper testing for this Family history noncontributory social doesn't smoke She could be doing better with diet and activity   Review of Systems  Constitutional: Negative for activity change, appetite change and fatigue.  HENT: Negative for congestion, rhinorrhea, neck pain and ear discharge.   Eyes: Negative for discharge.  Respiratory: Negative for cough, chest tightness and wheezing.   Cardiovascular: Negative for chest pain.  Gastrointestinal: Negative for vomiting and abdominal pain.  Genitourinary: Negative for frequency and difficulty urinating.  Allergic/Immunologic: Negative for environmental allergies and food allergies.  Neurological: Negative for weakness and headaches.   Psychiatric/Behavioral: Negative for behavioral problems and agitation.       Objective:   Physical Exam Lungs are clear hearts regular with murmur pulse normal abdomen soft extremities no edema foot exam normal neurologic normal       Assessment & Plan:  Diabetes subpar control patient will get periodic readings she will send them to [place] and we will evaluate possibility of adding other medicines she would like to followup in 3 months check hemoglobin A1c at if it is not where it needs to be then she will agree to go forward with further medicines Aortic stenosis follow through with cardiology Thyroid nodule ultrasound may need fine-needle aspiration Renal insufficiency check metabolic 7

## 2012-09-15 NOTE — Patient Instructions (Addendum)
Diabetes Meal Planning Guide The diabetes meal planning guide is a tool to help you plan your meals and snacks. It is important for people with diabetes to manage their blood glucose (sugar) levels. Choosing the right foods and the right amounts throughout your day will help control your blood glucose. Eating right can even help you improve your blood pressure and reach or maintain a healthy weight. CARBOHYDRATE COUNTING MADE EASY When you eat carbohydrates, they turn to sugar. This raises your blood glucose level. Counting carbohydrates can help you control this level so you feel better. When you plan your meals by counting carbohydrates, you can have more flexibility in what you eat and balance your medicine with your food intake. Carbohydrate counting simply means adding up the total amount of carbohydrate grams in your meals and snacks. Try to eat about the same amount at each meal. Foods with carbohydrates are listed below. Each portion below is 1 carbohydrate serving or 15 grams of carbohydrates. Ask your dietician how many grams of carbohydrates you should eat at each meal or snack. Grains and Starches  1 slice bread.   English muffin or hotdog/hamburger bun.   cup cold cereal (unsweetened).   cup cooked pasta or rice.   cup starchy vegetables (corn, potatoes, peas, beans, winter squash).  1 tortilla (6 inches).   bagel.  1 waffle or pancake (size of a CD).   cup cooked cereal.  4 to 6 small crackers. *Whole grain is recommended. Fruit  1 cup fresh unsweetened berries, melon, papaya, pineapple.  1 small fresh fruit.   banana or mango.   cup fruit juice (4 oz unsweetened).   cup canned fruit in natural juice or water.  2 tbs dried fruit.  12 to 15 grapes or cherries. Milk and Yogurt  1 cup fat-free or 1% milk.  1 cup soy milk.  6 oz light yogurt with sugar-free sweetener.  6 oz low-fat soy yogurt.  6 oz plain yogurt. Vegetables  1 cup raw or  cup  cooked is counted as 0 carbohydrates or a "free" food.  If you eat 3 or more servings at 1 meal, count them as 1 carbohydrate serving. Other Carbohydrates   oz chips or pretzels.   cup ice cream or frozen yogurt.   cup sherbet or sorbet.  2 inch square cake, no frosting.  1 tbs honey, sugar, jam, jelly, or syrup.  2 small cookies.  3 squares of graham crackers.  3 cups popcorn.  6 crackers.  1 cup broth-based soup.  Count 1 cup casserole or other mixed foods as 2 carbohydrate servings.  Foods with less than 20 calories in a serving may be counted as 0 carbohydrates or a "free" food. You may want to purchase a book or computer software that lists the carbohydrate gram counts of different foods. In addition, the nutrition facts panel on the labels of the foods you eat are a good source of this information. The label will tell you how big the serving size is and the total number of carbohydrate grams you will be eating per serving. Divide this number by 15 to obtain the number of carbohydrate servings in a portion. Remember, 1 carbohydrate serving equals 15 grams of carbohydrate. SERVING SIZES Measuring foods and serving sizes helps you make sure you are getting the right amount of food. The list below tells how big or small some common serving sizes are.  1 oz.........4 stacked dice.  3 oz.........Deck of cards.  1 tsp........Tip   of little finger.  1 tbs........Thumb.  2 tbs........Golf ball.   cup.......Half of a fist.  1 cup........A fist. SAMPLE DIABETES MEAL PLAN Below is a sample meal plan that includes foods from the grain and starches, dairy, vegetable, fruit, and meat groups. A dietician can individualize a meal plan to fit your calorie needs and tell you the number of servings needed from each food group. However, controlling the total amount of carbohydrates in your meal or snack is more important than making sure you include all of the food groups at every  meal. You may interchange carbohydrate containing foods (dairy, starches, and fruits). The meal plan below is an example of a 2000 calorie diet using carbohydrate counting. This meal plan has 17 carbohydrate servings. Breakfast  1 cup oatmeal (2 carb servings).   cup light yogurt (1 carb serving).  1 cup blueberries (1 carb serving).   cup almonds. Snack  1 large apple (2 carb servings).  1 low-fat string cheese stick. Lunch  Chicken breast salad.  1 cup spinach.   cup chopped tomatoes.  2 oz chicken breast, sliced.  2 tbs low-fat Italian dressing.  12 whole-wheat crackers (2 carb servings).  12 to 15 grapes (1 carb serving).  1 cup low-fat milk (1 carb serving). Snack  1 cup carrots.   cup hummus (1 carb serving). Dinner  3 oz broiled salmon.  1 cup brown rice (3 carb servings). Snack  1  cups steamed broccoli (1 carb serving) drizzled with 1 tsp olive oil and lemon juice.  1 cup light pudding (2 carb servings). DIABETES MEAL PLANNING WORKSHEET Your dietician can use this worksheet to help you decide how many servings of foods and what types of foods are right for you.  BREAKFAST Food Group and Servings / Carb Servings Grain/Starches __________________________________ Dairy __________________________________________ Vegetable ______________________________________ Fruit ___________________________________________ Meat __________________________________________ Fat ____________________________________________ LUNCH Food Group and Servings / Carb Servings Grain/Starches ___________________________________ Dairy ___________________________________________ Fruit ____________________________________________ Meat ___________________________________________ Fat _____________________________________________ DINNER Food Group and Servings / Carb Servings Grain/Starches ___________________________________ Dairy  ___________________________________________ Fruit ____________________________________________ Meat ___________________________________________ Fat _____________________________________________ SNACKS Food Group and Servings / Carb Servings Grain/Starches ___________________________________ Dairy ___________________________________________ Vegetable _______________________________________ Fruit ____________________________________________ Meat ___________________________________________ Fat _____________________________________________ DAILY TOTALS Starches _________________________ Vegetable ________________________ Fruit ____________________________ Dairy ____________________________ Meat ____________________________ Fat ______________________________ Document Released: 11/16/2004 Document Revised: 05/14/2011 Document Reviewed: 09/27/2008 ExitCare Patient Information 2014 [employer] DASH Diet The DASH diet stands for "Dietary Approaches to Stop Hypertension." It is a healthy eating plan that has been shown to reduce high blood pressure (hypertension) in as little as 14 days, while also possibly providing other significant health benefits. These other health benefits include reducing the risk of breast cancer after menopause and reducing the risk of type 2 diabetes, heart disease, colon cancer, and stroke. Health benefits also include weight loss and slowing kidney failure in patients with chronic kidney disease.  DIET GUIDELINES  Limit salt (sodium). Your diet should contain less than 1500 mg of sodium daily.  Limit refined or processed carbohydrates. Your diet should include mostly whole grains. Desserts and added sugars should be used sparingly.  Include small amounts of heart-healthy fats. These types of fats include nuts, oils, and tub margarine. Limit saturated and trans fats. These fats have been shown to be harmful in the body. CHOOSING FOODS  The following food groups  are based on a 2000 calorie diet. See your Registered Dietitian for individual calorie needs. Grains and Grain Products (6 to 8 servings daily)  Eat More Often:   Whole-wheat bread, brown rice, whole-grain or wheat pasta, quinoa, popcorn without added fat or salt (air popped).  Eat Less Often: White bread, white pasta, white rice, cornbread. Vegetables (4 to 5 servings daily)  Eat More Often: Fresh, frozen, and canned vegetables. Vegetables may be raw, steamed, roasted, or grilled with a minimal amount of fat.  Eat Less Often/Avoid: Creamed or fried vegetables. Vegetables in a cheese sauce. Fruit (4 to 5 servings daily)  Eat More Often: All fresh, canned (in natural juice), or frozen fruits. Dried fruits without added sugar. One hundred percent fruit juice ( cup [237 mL] daily).  Eat Less Often: Dried fruits with added sugar. Canned fruit in light or heavy syrup. Lean Meats, Fish, and Poultry (2 servings or less daily. One serving is 3 to 4 oz [85-114 g]).  Eat More Often: Ninety percent or leaner ground beef, tenderloin, sirloin. Round cuts of beef, chicken breast, turkey breast. All fish. Grill, bake, or broil your meat. Nothing should be fried.  Eat Less Often/Avoid: Fatty cuts of meat, turkey, or chicken leg, thigh, or wing. Fried cuts of meat or fish. Dairy (2 to 3 servings)  Eat More Often: Low-fat or fat-free milk, low-fat plain or light yogurt, reduced-fat or part-skim cheese.  Eat Less Often/Avoid: Milk (whole, 2%).Whole milk yogurt. Full-fat cheeses. Nuts, Seeds, and Legumes (4 to 5 servings per week)  Eat More Often: All without added salt.  Eat Less Often/Avoid: Salted nuts and seeds, canned beans with added salt. Fats and Sweets (limited)  Eat More Often: Vegetable oils, tub margarines without trans fats, sugar-free gelatin. Mayonnaise and salad dressings.  Eat Less Often/Avoid: Coconut oils, palm oils, butter, stick margarine, cream, half and half, cookies, candy,  pie. FOR MORE INFORMATION The Dash Diet Eating Plan: www.dashdiet.org Document Released: 02/08/2011 Document Revised: 05/14/2011 Document Reviewed: 02/08/2011 ExitCare Patient Information 2014 [employer].  

## 2012-09-16 ENCOUNTER — Other Ambulatory Visit: Payer: Self-pay | Admitting: Family Medicine

## 2012-09-19 ENCOUNTER — Other Ambulatory Visit: Payer: Self-pay | Admitting: Family Medicine

## 2012-09-19 LAB — BASIC METABOLIC PANEL
Chloride: 102 mEq/L (ref 96–112)
Glucose, Bld: 196 mg/dL — ABNORMAL HIGH (ref 70–99)
Potassium: 5 mEq/L (ref 3.5–5.3)
Sodium: 136 mEq/L (ref 135–145)

## 2012-09-19 LAB — HEPATIC FUNCTION PANEL
ALT: 17 U/L (ref 0–35)
Bilirubin, Direct: 0.1 mg/dL (ref 0.0–0.3)
Total Protein: 7 g/dL (ref 6.0–8.3)

## 2012-09-19 LAB — LIPID PANEL
Cholesterol: 147 mg/dL (ref 0–200)
Total CHOL/HDL Ratio: 2.4 Ratio
VLDL: 30 mg/dL (ref 0–40)

## 2012-09-19 LAB — TSH: TSH: 1.711 u[IU]/mL (ref 0.350–4.500)

## 2012-09-20 LAB — MICROALBUMIN, URINE: Microalb, Ur: 0.5 mg/dL (ref 0.00–1.89)

## 2012-09-29 ENCOUNTER — Ambulatory Visit (HOSPITAL_COMMUNITY)
Admission: RE | Admit: 2012-09-29 | Discharge: 2012-09-29 | Disposition: A | Discharge status: Home / Self Care | Payer: BC Managed Care – PPO | Source: Ambulatory Visit | Attending: Family Medicine | Admitting: Family Medicine

## 2012-09-29 DIAGNOSIS — E042 Nontoxic multinodular goiter: Secondary | ICD-10-CM | POA: Insufficient documentation

## 2012-10-10 ENCOUNTER — Telehealth: Payer: Self-pay | Admitting: Family Medicine

## 2012-10-10 ENCOUNTER — Other Ambulatory Visit: Payer: Self-pay | Admitting: *Deleted

## 2012-10-10 DIAGNOSIS — E041 Nontoxic single thyroid nodule: Secondary | ICD-10-CM

## 2012-10-10 NOTE — Telephone Encounter (Signed)
Patient would like results of Lab Work.  Please call Patient. Thanks

## 2012-10-10 NOTE — Telephone Encounter (Signed)
Discussed results of ultrasound.

## 2012-10-17 ENCOUNTER — Other Ambulatory Visit: Payer: Self-pay | Admitting: Family Medicine

## 2012-10-17 ENCOUNTER — Encounter (HOSPITAL_COMMUNITY): Payer: Self-pay

## 2012-10-17 ENCOUNTER — Ambulatory Visit (HOSPITAL_COMMUNITY)
Admission: RE | Admit: 2012-10-17 | Discharge: 2012-10-17 | Disposition: A | Discharge status: Home / Self Care | Payer: BC Managed Care – PPO | Source: Ambulatory Visit | Attending: Family Medicine | Admitting: Family Medicine

## 2012-10-17 VITALS — BP 175/89 | HR 95 | Temp 98.1°F | Resp 16

## 2012-10-17 DIAGNOSIS — E041 Nontoxic single thyroid nodule: Secondary | ICD-10-CM | POA: Insufficient documentation

## 2012-10-17 DIAGNOSIS — E079 Disorder of thyroid, unspecified: Secondary | ICD-10-CM | POA: Insufficient documentation

## 2012-10-17 MED ORDER — LIDOCAINE HCL (PF) 2 % IJ SOLN
10.0000 mL | Freq: Once | INTRAMUSCULAR | Status: AC
  Filled 2012-10-17: qty 10

## 2012-10-17 MED ORDER — LIDOCAINE HCL (PF) 2 % IJ SOLN
INTRAMUSCULAR | Status: AC
  Administered 2012-10-17: 2 mL via INTRADERMAL
  Filled 2012-10-17: qty 10

## 2012-10-17 NOTE — Procedures (Signed)
Prior imaging studies reviewed.  Procedure and associated risks reviewed with patient (including but not limited to bleeding, infection, vessel damage, non diagnostic study requiring follow up FNA).  Question answered,  Time out performed.  Under sterile technique and with 2 cc 1% lidocaine local anesthetic , right lobe of thyroid nodule aspirated with 4 passes of 25g needle during ultrasound guidance (avoiding adjacent vessels).  Slides prepared and sent to lab.  No immediate complications.  Ice pack applied and post procedures instructions reviewed with patient.

## 2012-10-17 NOTE — Progress Notes (Signed)
Pt for right Thyroid Biopsy with Dr. Constance Goltz. Lidocaine 2% given 2 mls my MD. Biopsy complete. No signs of distress.

## 2012-11-05 ENCOUNTER — Encounter: Payer: Self-pay | Admitting: Adult Health

## 2012-11-05 ENCOUNTER — Other Ambulatory Visit (HOSPITAL_COMMUNITY)
Admission: RE | Admit: 2012-11-05 | Discharge: 2012-11-05 | Disposition: A | Discharge status: Home / Self Care | Payer: BC Managed Care – PPO | Source: Ambulatory Visit | Attending: Adult Health | Admitting: Adult Health

## 2012-11-05 ENCOUNTER — Ambulatory Visit (INDEPENDENT_AMBULATORY_CARE_PROVIDER_SITE_OTHER): Payer: BC Managed Care – PPO | Admitting: Adult Health

## 2012-11-05 VITALS — BP 142/70 | HR 78 | Ht 67.5 in | Wt 234.0 lb

## 2012-11-05 DIAGNOSIS — Z1212 Encounter for screening for malignant neoplasm of rectum: Secondary | ICD-10-CM

## 2012-11-05 DIAGNOSIS — Z01419 Encounter for gynecological examination (general) (routine) without abnormal findings: Secondary | ICD-10-CM

## 2012-11-05 DIAGNOSIS — Z1151 Encounter for screening for human papillomavirus (HPV): Secondary | ICD-10-CM | POA: Insufficient documentation

## 2012-11-05 DIAGNOSIS — E049 Nontoxic goiter, unspecified: Secondary | ICD-10-CM

## 2012-11-05 DIAGNOSIS — E119 Type 2 diabetes mellitus without complications: Secondary | ICD-10-CM

## 2012-11-05 DIAGNOSIS — I10 Essential (primary) hypertension: Secondary | ICD-10-CM

## 2012-11-05 LAB — HEMOCCULT GUIAC POC 1CARD (OFFICE)

## 2012-11-05 NOTE — Progress Notes (Signed)
Patient ID: CELISSE CIULLA, female   DOB: 01-14-1956, 57 y.o.   MRN: [id number] History of Present Illness: Sorina is a 57 year old white female married in for a pap and physical. Her last A1c was 9 and she is working on it and we discussed decreasing carbs and making choices with increase fiber.  Current Medications, Allergies, Past Medical History, Past Surgical History, Family History and Social History were reviewed in [employer] record.     Review of Systems: Patient denies any headaches, blurred vision, shortness of breath, chest pain, abdominal pain, problems with bowel movements, urination, or intercourse. No mood swings or joint swelling.   Physical Exam:BP 142/70  Pulse 78  Ht 5' 7.5" (1.715 m)  Wt 234 lb (106.142 kg)  BMI 36.09 kg/m2 General:  Well developed, well nourished, no acute distress Skin:  Warm and dry Neck:  Midline trachea, thyroid enlarged right > left has had negative biopsy. Lungs; Clear to auscultation bilaterally Breast:  No dominant palpable mass, retraction, or nipple discharge Cardiovascular: Regular rate and rhythm Abdomen:  Soft, non tender, no hepatosplenomegaly Pelvic:  External genitalia is normal in appearance.  The vagina is normal in appearance for age.               The cervix is bulbous. Pap performed with HPV. Uterus is felt to be normal size, shape, and contour.  No  adnexal masses or tenderness noted. Rectal: Good sphincter tone, no polyps, or hemorrhoids felt.  Hemoccult negative. Extremities:  No swelling or varicosities noted Psych:  Alert and cooperative seems happy, works at [employer] school   Impression: Yearly gyn exam History of hypertension,diabetes, hperlipemia Enlarged thyroid with - biopsy   Plan: Physical in 1 year Mammogram yearly Colonoscopy per GI Labs at PCP Get flu shot

## 2012-11-05 NOTE — Patient Instructions (Addendum)
Physical in 1 year Mammogram yearly Labs with PCP Colonoscopy as per GI Get flu shot

## 2012-11-07 ENCOUNTER — Other Ambulatory Visit: Payer: Self-pay | Admitting: Family Medicine

## 2012-11-07 DIAGNOSIS — Z139 Encounter for screening, unspecified: Secondary | ICD-10-CM

## 2012-12-15 ENCOUNTER — Ambulatory Visit (HOSPITAL_COMMUNITY)
Admission: RE | Admit: 2012-12-15 | Discharge: 2012-12-15 | Disposition: A | Discharge status: Home / Self Care | Payer: BC Managed Care – PPO | Source: Ambulatory Visit | Attending: Family Medicine | Admitting: Family Medicine

## 2012-12-15 DIAGNOSIS — Z1231 Encounter for screening mammogram for malignant neoplasm of breast: Secondary | ICD-10-CM | POA: Insufficient documentation

## 2012-12-15 DIAGNOSIS — Z139 Encounter for screening, unspecified: Secondary | ICD-10-CM

## 2012-12-18 ENCOUNTER — Encounter: Payer: Self-pay | Admitting: Family Medicine

## 2012-12-18 ENCOUNTER — Ambulatory Visit (INDEPENDENT_AMBULATORY_CARE_PROVIDER_SITE_OTHER): Payer: BC Managed Care – PPO | Admitting: Family Medicine

## 2012-12-18 VITALS — BP 124/74 | Ht 66.0 in | Wt 232.0 lb

## 2012-12-18 DIAGNOSIS — Z23 Encounter for immunization: Secondary | ICD-10-CM

## 2012-12-18 DIAGNOSIS — E119 Type 2 diabetes mellitus without complications: Secondary | ICD-10-CM

## 2012-12-18 NOTE — Progress Notes (Signed)
  Subjective:    Patient ID: [name], female    DOB: 1956-01-13, 57 y.o.   MRN: [id number]  HPIDiabetes. Checks blood sugar almost every day. Fasting blood sugar numbers range from 103-123. A1C today. The patient was seen today as part of a comprehensive diabetic check up. The patient had the following elements completed: -Review of medication compliance -Review of glucose monitoring results -Review of any complications do to high or low sugars -Diabetic foot exam was completed as part of today's visit. The following was also discussed: -Importance of yearly eye exams -Importance of following diabetic/low sugar-starch diet -Importance of exercise and regular activity -Importance of regular followup visits. -Most recent hemoglobin A1c were reviewed with the patient along with goals regarding diabetes.  Flu Vaccine.  Nov 4 Dr Velna Hatchet in [place] for eye Review of Systems     Objective:   Physical Exam  Neck no masses lungs are clear hearts regular murmur noted patient has history of aortic stenosis followed by cardiology abdomen soft extremities no edema skin warm dry      Assessment & Plan:  #1 diabetes much better control. Congratulated patient on her improvement. She is continue all of her medicines. Followup in approximately 5 months at that time she will need hemoglobin A1c. along with lab work looking at metabolic 7/liver/TSH/lipid/urine micro-protein  Continue healthy diet continue medication continue activity

## 2013-02-19 ENCOUNTER — Other Ambulatory Visit: Payer: Self-pay | Admitting: Family Medicine

## 2013-02-19 NOTE — Telephone Encounter (Signed)
May refill x1, also please tell the patient that we would like for her to be seen because her husband stated that she has been coughing for several weeks.

## 2013-04-29 ENCOUNTER — Other Ambulatory Visit: Payer: Self-pay | Admitting: Family Medicine

## 2013-07-06 ENCOUNTER — Other Ambulatory Visit: Payer: Self-pay | Admitting: Family Medicine

## 2013-08-10 ENCOUNTER — Other Ambulatory Visit: Payer: Self-pay | Admitting: Family Medicine

## 2013-08-26 ENCOUNTER — Ambulatory Visit: Payer: BC Managed Care – PPO | Admitting: Family Medicine

## 2013-09-02 LAB — HM DIABETES EYE EXAM

## 2013-09-09 ENCOUNTER — Encounter: Payer: Self-pay | Admitting: Family Medicine

## 2013-09-09 ENCOUNTER — Ambulatory Visit (INDEPENDENT_AMBULATORY_CARE_PROVIDER_SITE_OTHER): Payer: BC Managed Care – PPO | Admitting: Family Medicine

## 2013-09-09 VITALS — BP 130/82 | Ht 66.0 in | Wt 233.5 lb

## 2013-09-09 DIAGNOSIS — N289 Disorder of kidney and ureter, unspecified: Secondary | ICD-10-CM

## 2013-09-09 DIAGNOSIS — E119 Type 2 diabetes mellitus without complications: Secondary | ICD-10-CM

## 2013-09-09 DIAGNOSIS — I10 Essential (primary) hypertension: Secondary | ICD-10-CM

## 2013-09-09 DIAGNOSIS — E041 Nontoxic single thyroid nodule: Secondary | ICD-10-CM

## 2013-09-09 DIAGNOSIS — Z79899 Other long term (current) drug therapy: Secondary | ICD-10-CM

## 2013-09-09 DIAGNOSIS — E785 Hyperlipidemia, unspecified: Secondary | ICD-10-CM

## 2013-09-09 LAB — POCT GLYCOSYLATED HEMOGLOBIN (HGB A1C): Hemoglobin A1C: 6.7

## 2013-09-09 MED ORDER — LORAZEPAM 0.5 MG PO TABS: ORAL_TABLET | ORAL | Status: DC

## 2013-09-09 NOTE — Progress Notes (Signed)
   Subjective:    Patient ID: [name], female    DOB: 06/17/1955, 58 y.o.   MRN: [id number]  Diabetes She presents for her follow-up diabetic visit. She has type 2 diabetes mellitus. Her disease course has been stable. There are no hypoglycemic associated symptoms. Pertinent negatives for hypoglycemia include no confusion. There are no diabetic associated symptoms. Pertinent negatives for diabetes include no chest pain, no fatigue, no polydipsia, no polyphagia and no weakness. There are no hypoglycemic complications. Symptoms are stable. There are no diabetic complications. There are no known risk factors for coronary artery disease. Current diabetic treatment includes oral agent (dual therapy). She is compliant with treatment all of the time.   Patient has no concerns at this time. Needs refills on all medications.     Review of Systems  Constitutional: Negative for activity change, appetite change and fatigue.  HENT: Negative for congestion.   Respiratory: Negative for cough and shortness of breath.   Cardiovascular: Negative for chest pain.  Gastrointestinal: Negative for abdominal pain.  Endocrine: Negative for polydipsia and polyphagia.  Genitourinary: Negative for frequency.  Neurological: Negative for weakness.  Psychiatric/Behavioral: Negative for confusion.       Objective:   Physical Exam  Vitals reviewed. Constitutional: She appears well-nourished. No distress.  Cardiovascular: Normal rate, regular rhythm and normal heart sounds.   No murmur heard. Pulmonary/Chest: Effort normal and breath sounds normal. No respiratory distress.  Musculoskeletal: She exhibits no edema.  Lymphadenopathy:    She has no cervical adenopathy.  Neurological: She is alert. She exhibits normal muscle tone.  Psychiatric: Her behavior is normal.          Assessment & Plan:  1. Type 2 diabetes mellitus without complication She is actually doing well on her A1c continue good diet  exercise taking medications. - POCT glycosylated hemoglobin (Hb A1C) - Microalbumin, urine  2. Type 2 diabetes mellitus not at goal See above  3. Essential hypertension She is trying to follow a low-salt diet staying physically active to a degree. Blood pressure looks good. - Basic metabolic panel  4. Renal insufficiency She has history renal insufficiency recheck metabolic 7  5. Hyperlipidemia She has history hyperlipidemia continue medication - Lipid panel  6. Thyroid nodule She had a benign biopsy last year need a repeat ultrasound to see if the nodule is changed in size it has gotten bigger may need an ENT referral - TSH - US Soft Tissue Head/Neck  7. Encounter for long-term (current) use of other medications  Patient will followup in 6 months - Hepatic function panel

## 2013-09-10 ENCOUNTER — Other Ambulatory Visit: Payer: Self-pay | Admitting: Family Medicine

## 2013-09-11 ENCOUNTER — Ambulatory Visit (HOSPITAL_COMMUNITY)
Admission: RE | Admit: 2013-09-11 | Discharge: 2013-09-11 | Disposition: A | Discharge status: Home / Self Care | Payer: BC Managed Care – PPO | Source: Ambulatory Visit | Attending: Family Medicine | Admitting: Family Medicine

## 2013-09-11 DIAGNOSIS — E042 Nontoxic multinodular goiter: Secondary | ICD-10-CM | POA: Insufficient documentation

## 2013-09-15 ENCOUNTER — Encounter: Payer: Self-pay | Admitting: Family Medicine

## 2013-09-15 LAB — HEPATIC FUNCTION PANEL
ALT: 13 U/L (ref 0–35)
AST: 15 U/L (ref 0–37)
Albumin: 3.9 g/dL (ref 3.5–5.2)
Alkaline Phosphatase: 73 U/L (ref 39–117)
BILIRUBIN INDIRECT: 0.3 mg/dL (ref 0.2–1.2)
Bilirubin, Direct: 0.1 mg/dL (ref 0.0–0.3)
TOTAL PROTEIN: 6.4 g/dL (ref 6.0–8.3)
Total Bilirubin: 0.4 mg/dL (ref 0.2–1.2)

## 2013-09-15 LAB — BASIC METABOLIC PANEL
BUN: 20 mg/dL (ref 6–23)
CO2: 28 mEq/L (ref 19–32)
Calcium: 9 mg/dL (ref 8.4–10.5)
Chloride: 101 mEq/L (ref 96–112)
Creat: 1.09 mg/dL (ref 0.50–1.10)
Glucose, Bld: 155 mg/dL — ABNORMAL HIGH (ref 70–99)
Potassium: 5 mEq/L (ref 3.5–5.3)
Sodium: 136 mEq/L (ref 135–145)

## 2013-09-15 LAB — LIPID PANEL
CHOL/HDL RATIO: 2.5 ratio
CHOLESTEROL: 145 mg/dL (ref 0–200)
HDL: 57 mg/dL (ref >39)
LDL Cholesterol: 59 mg/dL (ref 0–99)
TRIGLYCERIDES: 147 mg/dL (ref <150)
VLDL: 29 mg/dL (ref 0–40)

## 2013-09-15 LAB — TSH: TSH: 1.958 u[IU]/mL (ref 0.350–4.500)

## 2013-09-15 LAB — T4, FREE: Free T4: 1.37 ng/dL (ref 0.80–1.80)

## 2013-09-15 LAB — MICROALBUMIN, URINE: Microalb, Ur: 0.5 mg/dL (ref 0.00–1.89)

## 2013-11-13 ENCOUNTER — Other Ambulatory Visit: Payer: Self-pay | Admitting: Family Medicine

## 2013-11-13 DIAGNOSIS — Z1231 Encounter for screening mammogram for malignant neoplasm of breast: Secondary | ICD-10-CM

## 2013-11-16 ENCOUNTER — Other Ambulatory Visit: Payer: Self-pay | Admitting: Family Medicine

## 2013-11-30 ENCOUNTER — Encounter: Payer: Self-pay | Admitting: Family Medicine

## 2013-11-30 ENCOUNTER — Ambulatory Visit (INDEPENDENT_AMBULATORY_CARE_PROVIDER_SITE_OTHER): Payer: BC Managed Care – PPO | Admitting: Family Medicine

## 2013-11-30 VITALS — BP 130/78 | Ht 66.0 in | Wt 232.2 lb

## 2013-11-30 DIAGNOSIS — Z23 Encounter for immunization: Secondary | ICD-10-CM

## 2013-11-30 DIAGNOSIS — S39012A Strain of muscle, fascia and tendon of lower back, initial encounter: Secondary | ICD-10-CM

## 2013-11-30 DIAGNOSIS — M62838 Other muscle spasm: Secondary | ICD-10-CM

## 2013-11-30 MED ORDER — CHLORZOXAZONE 500 MG PO TABS
500.0000 mg | ORAL_TABLET | Freq: Four times a day (QID) | ORAL | Status: DC | PRN
Start: 2013-11-30 — End: 2015-10-18

## 2013-11-30 MED ORDER — HYDROCODONE-ACETAMINOPHEN 5-325 MG PO TABS: 1.0000 | ORAL_TABLET | Freq: Four times a day (QID) | ORAL | Status: DC | PRN

## 2013-11-30 NOTE — Progress Notes (Signed)
   Subjective:    Patient ID: [name], female    DOB: March 25, 1955, 58 y.o.   MRN: [id number]  HPI  Back pain and spasms since Friday. She did lift some paint cans.  tried tylenol and pain pill  no numbness or tingling Certain movements such as deep breath or twist or standing Did not have problems with this before until recently. She did lift some paint cans that might have triggered it she thinks. She denies any other trouble. Review of Systems     Objective:   Physical Exam  Vitals reviewed. Constitutional: She appears well-nourished. No distress.  Cardiovascular: Normal rate, regular rhythm and normal heart sounds.   No murmur heard. Pulmonary/Chest: Effort normal and breath sounds normal. No respiratory distress.  Musculoskeletal: She exhibits tenderness. She exhibits no edema.  Lymphadenopathy:    She has no cervical adenopathy.  Neurological: She is alert. She exhibits normal muscle tone.  Psychiatric: Her behavior is normal.    Denies any numbness or tingling down the legs      Assessment & Plan:  Back strain-stretches were shown massages on regular basis should gradually get better warning signs discussed. Muscle relaxer if necessary for home use pain medication for home use may use anti-inflammatories during the day if not better in the next 7 days notify us

## 2013-12-18 ENCOUNTER — Other Ambulatory Visit: Payer: Self-pay | Admitting: Family Medicine

## 2013-12-28 ENCOUNTER — Ambulatory Visit (HOSPITAL_COMMUNITY)
Admission: RE | Admit: 2013-12-28 | Discharge: 2013-12-28 | Disposition: A | Discharge status: Home / Self Care | Payer: BC Managed Care – PPO | Source: Ambulatory Visit | Attending: Family Medicine | Admitting: Family Medicine

## 2013-12-28 DIAGNOSIS — Z1231 Encounter for screening mammogram for malignant neoplasm of breast: Secondary | ICD-10-CM | POA: Diagnosis present

## 2014-01-04 ENCOUNTER — Encounter: Payer: Self-pay | Admitting: Family Medicine

## 2014-01-18 ENCOUNTER — Other Ambulatory Visit: Payer: Self-pay | Admitting: Family Medicine

## 2014-01-26 ENCOUNTER — Other Ambulatory Visit: Payer: Self-pay | Admitting: *Deleted

## 2014-01-26 ENCOUNTER — Telehealth: Payer: Self-pay | Admitting: Family Medicine

## 2014-01-26 MED ORDER — HYDROCODONE-ACETAMINOPHEN 5-325 MG PO TABS: 1.0000 | ORAL_TABLET | Freq: Four times a day (QID) | ORAL | Status: DC | PRN

## 2014-01-26 NOTE — Telephone Encounter (Signed)
rx ready for pickup. Pt notified 

## 2014-01-26 NOTE — Telephone Encounter (Signed)
Refill med, if ongoing then NTBS

## 2014-01-26 NOTE — Telephone Encounter (Signed)
Pt hurt her back or reinjured it racking leaves over the last several days  She would like a refill on her HYDROcodone-acetaminophen (NORCO/VICODIN) 5-325 MG per tablet   Seen Sept for the first time on this

## 2014-03-10 ENCOUNTER — Ambulatory Visit: Payer: BC Managed Care – PPO | Admitting: Family Medicine

## 2014-03-18 ENCOUNTER — Other Ambulatory Visit: Payer: Self-pay | Admitting: Family Medicine

## 2014-03-29 ENCOUNTER — Other Ambulatory Visit: Payer: Self-pay | Admitting: Family Medicine

## 2014-03-30 ENCOUNTER — Ambulatory Visit (INDEPENDENT_AMBULATORY_CARE_PROVIDER_SITE_OTHER): Payer: BC Managed Care – PPO | Admitting: Family Medicine

## 2014-03-30 ENCOUNTER — Ambulatory Visit: Payer: BC Managed Care – PPO | Admitting: Family Medicine

## 2014-03-30 VITALS — BP 120/62 | Ht 66.0 in | Wt 234.0 lb

## 2014-03-30 DIAGNOSIS — Z79899 Other long term (current) drug therapy: Secondary | ICD-10-CM

## 2014-03-30 DIAGNOSIS — E119 Type 2 diabetes mellitus without complications: Secondary | ICD-10-CM

## 2014-03-30 DIAGNOSIS — I10 Essential (primary) hypertension: Secondary | ICD-10-CM

## 2014-03-30 DIAGNOSIS — G2581 Restless legs syndrome: Secondary | ICD-10-CM

## 2014-03-30 DIAGNOSIS — E785 Hyperlipidemia, unspecified: Secondary | ICD-10-CM

## 2014-03-30 LAB — POCT GLYCOSYLATED HEMOGLOBIN (HGB A1C): Hemoglobin A1C: 7.7

## 2014-03-30 MED ORDER — LORAZEPAM 1 MG PO TABS: 1.0000 mg | ORAL_TABLET | Freq: Every day | ORAL | Status: DC

## 2014-03-30 MED ORDER — INSULIN GLARGINE 100 UNIT/ML SOLOSTAR PEN: PEN_INJECTOR | SUBCUTANEOUS | Status: DC

## 2014-03-30 MED ORDER — METFORMIN HCL 1000 MG PO TABS: ORAL_TABLET | ORAL | Status: DC

## 2014-03-30 MED ORDER — LISINOPRIL-HYDROCHLOROTHIAZIDE 20-25 MG PO TABS: 1.0000 | ORAL_TABLET | Freq: Every day | ORAL | Status: DC

## 2014-03-30 MED ORDER — PRAVASTATIN SODIUM 20 MG PO TABS: ORAL_TABLET | ORAL | Status: DC

## 2014-03-30 NOTE — Progress Notes (Signed)
   Subjective:    Patient ID: [name], female    DOB: [birth year], 59 y.o.   MRN: [id number]  Diabetes She presents for her follow-up diabetic visit. She has type 2 diabetes mellitus. Pertinent negatives for hypoglycemia include no confusion. Pertinent negatives for diabetes include no fatigue, no polydipsia, no polyphagia and no weakness. Current diabetic treatment includes insulin injections and oral agent (dual therapy) (glyburide, metformin, and lantus). She is compliant with treatment all of the time. Her breakfast blood glucose range is generally 110-130 mg/dl. She does not see a podiatrist.Eye exam is current (last [place]).   Pt states no other concerns.    Review of Systems  Constitutional: Negative for activity change, appetite change and fatigue.  Endocrine: Negative for polydipsia and polyphagia.  Genitourinary: Negative for frequency.  Neurological: Negative for weakness.  Psychiatric/Behavioral: Negative for confusion.       Objective:   Physical Exam  Constitutional: She appears well-nourished. No distress.  Cardiovascular: Normal rate, regular rhythm and normal heart sounds.   No murmur heard. Pulmonary/Chest: Effort normal and breath sounds normal. No respiratory distress.  Musculoskeletal: She exhibits no edema.  Lymphadenopathy:    She has no cervical adenopathy.  Neurological: She is alert. She exhibits normal muscle tone.  Psychiatric: Her behavior is normal.  Vitals reviewed.         Assessment & Plan:  1. Type 2 diabetes mellitus without complication Patient's Q8G is not under great control we talked about diet physical activity she is to work hard on this in addition to this we will go ahead with considering add medication if her A1c is not significantly better within 3 months - POCT glycosylated hemoglobin (Hb A1C) - Hemoglobin A1c  2. Essential hypertension Blood pressure under decent control continue current measures  3.  Hyperlipidemia Patient will be do lab work to look at cholesterol before her next visit in 3 months - Lipid panel  4. Restless leg syndrome She states that the medication helps to a degree but 3 nights out of the week she feels like she needs twice as much therefore we did bump up the dose of the medicine.  5. High risk medication use Lab work was ordered.  25 minutes was spent with the patient going over multiple different issues - Hepatic function panel - Basic metabolic panel

## 2014-03-30 NOTE — Patient Instructions (Signed)
Am goal 100- n124  premeal < 160  prebedtime < 160

## 2014-06-29 ENCOUNTER — Ambulatory Visit: Payer: BC Managed Care – PPO | Admitting: Family Medicine

## 2014-07-03 LAB — LIPID PANEL
CHOL/HDL RATIO: 2.5 ratio
Cholesterol: 168 mg/dL (ref 0–200)
HDL: 66 mg/dL (ref >=46)
LDL CALC: 67 mg/dL (ref 0–99)
Triglycerides: 173 mg/dL — ABNORMAL HIGH (ref <150)
VLDL: 35 mg/dL (ref 0–40)

## 2014-07-03 LAB — HEPATIC FUNCTION PANEL
ALT: 16 U/L (ref 0–35)
AST: 18 U/L (ref 0–37)
Albumin: 4.2 g/dL (ref 3.5–5.2)
Alkaline Phosphatase: 67 U/L (ref 39–117)
BILIRUBIN DIRECT: 0.1 mg/dL (ref 0.0–0.3)
BILIRUBIN TOTAL: 0.5 mg/dL (ref 0.2–1.2)
Indirect Bilirubin: 0.4 mg/dL (ref 0.2–1.2)
Total Protein: 6.9 g/dL (ref 6.0–8.3)

## 2014-07-03 LAB — BASIC METABOLIC PANEL
BUN: 26 mg/dL — ABNORMAL HIGH (ref 6–23)
CO2: 25 mEq/L (ref 19–32)
CREATININE: 0.99 mg/dL (ref 0.50–1.10)
Calcium: 9.8 mg/dL (ref 8.4–10.5)
Chloride: 100 mEq/L (ref 96–112)
Glucose, Bld: 200 mg/dL — ABNORMAL HIGH (ref 70–99)
Potassium: 4.8 mEq/L (ref 3.5–5.3)
SODIUM: 135 meq/L (ref 135–145)

## 2014-07-03 LAB — HEMOGLOBIN A1C
Hgb A1c MFr Bld: 8.1 % — ABNORMAL HIGH (ref <5.7)
Mean Plasma Glucose: 186 mg/dL — ABNORMAL HIGH (ref <117)

## 2014-07-04 LAB — HM DIABETES EYE EXAM

## 2014-07-06 ENCOUNTER — Ambulatory Visit (INDEPENDENT_AMBULATORY_CARE_PROVIDER_SITE_OTHER): Payer: BC Managed Care – PPO | Admitting: Family Medicine

## 2014-07-06 ENCOUNTER — Encounter: Payer: Self-pay | Admitting: Family Medicine

## 2014-07-06 VITALS — BP 128/84 | Ht 66.0 in | Wt 230.2 lb

## 2014-07-06 DIAGNOSIS — E785 Hyperlipidemia, unspecified: Secondary | ICD-10-CM

## 2014-07-06 DIAGNOSIS — I10 Essential (primary) hypertension: Secondary | ICD-10-CM

## 2014-07-06 DIAGNOSIS — E119 Type 2 diabetes mellitus without complications: Secondary | ICD-10-CM | POA: Diagnosis not present

## 2014-07-06 MED ORDER — HYDROCODONE-ACETAMINOPHEN 5-325 MG PO TABS: 1.0000 | ORAL_TABLET | Freq: Four times a day (QID) | ORAL | Status: DC | PRN

## 2014-07-06 NOTE — Patient Instructions (Signed)
Diabetes Mellitus and Food It is important for you to manage your blood sugar (glucose) level. Your blood glucose level can be greatly affected by what you eat. Eating healthier foods in the appropriate amounts throughout the day at about the same time each day will help you control your blood glucose level. It can also help slow or prevent worsening of your diabetes mellitus. Healthy eating may even help you improve the level of your blood pressure and reach or maintain a healthy weight.  HOW CAN FOOD AFFECT ME? Carbohydrates Carbohydrates affect your blood glucose level more than any other type of food. Your dietitian will help you determine how many carbohydrates to eat at each meal and teach you how to count carbohydrates. Counting carbohydrates is important to keep your blood glucose at a healthy level, especially if you are using insulin or taking certain medicines for diabetes mellitus. Alcohol Alcohol can cause sudden decreases in blood glucose (hypoglycemia), especially if you use insulin or take certain medicines for diabetes mellitus. Hypoglycemia can be a life-threatening condition. Symptoms of hypoglycemia (sleepiness, dizziness, and disorientation) are similar to symptoms of having too much alcohol.  If your health care provider has given you approval to drink alcohol, do so in moderation and use the following guidelines:  Women should not have more than one drink per day, and men should not have more than two drinks per day. One drink is equal to:  12 oz of beer.  5 oz of wine.  1 oz of hard liquor.  Do not drink on an empty stomach.  Keep yourself hydrated. Have water, diet soda, or unsweetened iced tea.  Regular soda, juice, and other mixers might contain a lot of carbohydrates and should be counted. WHAT FOODS ARE NOT RECOMMENDED? As you make food choices, it is important to remember that all foods are not the same. Some foods have fewer nutrients per serving than other  foods, even though they might have the same number of calories or carbohydrates. It is difficult to get your body what it needs when you eat foods with fewer nutrients. Examples of foods that you should avoid that are high in calories and carbohydrates but low in nutrients include:  Trans fats (most processed foods list trans fats on the Nutrition Facts label).  Regular soda.  Juice.  Candy.  Sweets, such as cake, pie, doughnuts, and cookies.  Fried foods. WHAT FOODS CAN I EAT? Have nutrient-rich foods, which will nourish your body and keep you healthy. The food you should eat also will depend on several factors, including:  The calories you need.  The medicines you take.  Your weight.  Your blood glucose level.  Your blood pressure level.  Your cholesterol level. You also should eat a variety of foods, including:  Protein, such as meat, poultry, fish, tofu, nuts, and seeds (lean animal proteins are best).  Fruits.  Vegetables.  Dairy products, such as milk, cheese, and yogurt (low fat is best).  Breads, grains, pasta, cereal, rice, and beans.  Fats such as olive oil, trans fat-free margarine, canola oil, avocado, and olives. DOES EVERYONE WITH DIABETES MELLITUS HAVE THE SAME MEAL PLAN? Because every person with diabetes mellitus is different, there is not one meal plan that works for everyone. It is very important that you meet with a dietitian who will help you create a meal plan that is just right for you. Document Released: 11/16/2004 Document Revised: 02/24/2013 Document Reviewed: 01/16/2013 Exit[employer] Patient Information 2015 [employer]. This   information is not intended to replace advice given to you by your health care provider. Make sure you discuss any questions you have with your health care provider.  

## 2014-07-06 NOTE — Progress Notes (Signed)
   Subjective:    Patient ID: [name], female    DOB: Apr 11, 1955, 59 y.o.   MRN: [id number]  Diabetes She presents for her follow-up diabetic visit. She has type 2 diabetes mellitus. Pertinent negatives for hypoglycemia include no confusion. Pertinent negatives for diabetes include no chest pain, no fatigue, no polydipsia, no polyphagia and no weakness. Risk factors for coronary artery disease include dyslipidemia, diabetes mellitus and hypertension. Current diabetic treatment includes insulin injections and oral agent (dual therapy). She is compliant with treatment all of the time. Her weight is stable. She is following a diabetic diet. She has not had a previous visit with a dietitian. She does not see a podiatrist.Eye exam is not current.   This patient does have risk factors for other conditions including heart disease and stroke. The importance of keeping blood pressure cholesterol sugar under better control was discussed. She will follow-up again in approximately 3-4 months to check an A1c. 25 minutes patient discussing these issues greater than half in discussion   Review of Systems  Constitutional: Negative for activity change, appetite change and fatigue.  HENT: Negative for congestion.   Respiratory: Negative for cough.   Cardiovascular: Negative for chest pain.  Gastrointestinal: Negative for abdominal pain.  Endocrine: Negative for polydipsia and polyphagia.  Neurological: Negative for weakness.  Psychiatric/Behavioral: Negative for confusion.       Objective:   Physical Exam  Constitutional: She appears well-nourished. No distress.  Cardiovascular: Normal rate, regular rhythm and normal heart sounds.   No murmur heard. Pulmonary/Chest: Effort normal and breath sounds normal. No respiratory distress.  Musculoskeletal: She exhibits no edema.  Lymphadenopathy:    She has no cervical adenopathy.  Neurological: She is alert. She exhibits normal muscle tone.    Psychiatric: Her behavior is normal.  Vitals reviewed.         Assessment & Plan:  1. Essential hypertension Blood pressure under good control continue current measures. I encouraged patient doing much better job walking watching diet and trying to bring her weight down. She is been under some stress with her husband's health  2. Type 2 diabetes mellitus not at goal Diabetes is not were needs to be she will check her sugars a couple times a day of the next few weeks she will send [place] the readings we may well need to add additional medication to what she is doing. Or we may need to adjust the dosage. It seems that her a.m. sugars overall are doing well I would recommend continuing the current dose of Lantus 24 units.  3. Hyperlipemia Cholesterol is at goal she needs good job watching diet sticking with medication.  25 minutes spent with the patient discussing the importance of getting diabetes under control. Go ahead and start 81 mg aspirin 1 daily. Risk and benefits were discussed.

## 2014-07-20 ENCOUNTER — Telehealth: Payer: Self-pay | Admitting: Family Medicine

## 2014-07-20 NOTE — Telephone Encounter (Signed)
Pt dropped of a form to be filled out for the [employer]. Front desk portion has been filled out and form is in the message basket at the Check out window.

## 2014-07-26 NOTE — Telephone Encounter (Signed)
[name], have you seen?  I am unable to locate.

## 2014-07-26 NOTE — Telephone Encounter (Signed)
This form was filled out. To the best of my knowledge forwarded but I do not know where it went from there, please check with Danae Chen thank you

## 2014-07-26 NOTE — Telephone Encounter (Signed)
Patient calling to check on this form.  I was unable to locate it.  She is going to call in the morning to check on this.

## 2014-10-12 ENCOUNTER — Other Ambulatory Visit: Payer: Self-pay | Admitting: Family Medicine

## 2014-10-12 NOTE — Telephone Encounter (Signed)
The patient may have these refills plus one additional refill. The patient must, for office visit in September

## 2014-10-13 ENCOUNTER — Other Ambulatory Visit: Payer: Self-pay | Admitting: *Deleted

## 2014-10-13 ENCOUNTER — Telehealth: Payer: Self-pay | Admitting: *Deleted

## 2014-10-13 DIAGNOSIS — E041 Nontoxic single thyroid nodule: Secondary | ICD-10-CM

## 2014-10-13 NOTE — Telephone Encounter (Signed)
Pt in [place] file for repeat ultrasound soft tissue head/neck. Test scheduled aph 10/13/14 at 2 pm. Pt notified.

## 2014-10-14 ENCOUNTER — Ambulatory Visit (HOSPITAL_COMMUNITY)
Admission: RE | Admit: 2014-10-14 | Discharge: 2014-10-14 | Disposition: A | Discharge status: Home / Self Care | Payer: BC Managed Care – PPO | Source: Ambulatory Visit | Attending: Family Medicine | Admitting: Family Medicine

## 2014-10-14 DIAGNOSIS — E041 Nontoxic single thyroid nodule: Secondary | ICD-10-CM | POA: Diagnosis not present

## 2014-11-04 ENCOUNTER — Ambulatory Visit: Payer: BC Managed Care – PPO | Admitting: Family Medicine

## 2014-11-10 ENCOUNTER — Other Ambulatory Visit: Payer: Self-pay | Admitting: Family Medicine

## 2014-11-10 NOTE — Telephone Encounter (Signed)
May have this +2 refills, needs office visit this fall

## 2014-12-02 ENCOUNTER — Other Ambulatory Visit: Payer: Self-pay | Admitting: Family Medicine

## 2014-12-02 DIAGNOSIS — Z1231 Encounter for screening mammogram for malignant neoplasm of breast: Secondary | ICD-10-CM

## 2014-12-17 ENCOUNTER — Other Ambulatory Visit: Payer: Self-pay | Admitting: Family Medicine

## 2014-12-30 ENCOUNTER — Encounter: Payer: Self-pay | Admitting: Family Medicine

## 2014-12-30 ENCOUNTER — Ambulatory Visit (HOSPITAL_COMMUNITY)
Admission: RE | Admit: 2014-12-30 | Discharge: 2014-12-30 | Disposition: A | Discharge status: Home / Self Care | Payer: BC Managed Care – PPO | Source: Ambulatory Visit | Attending: Family Medicine | Admitting: Family Medicine

## 2014-12-30 ENCOUNTER — Ambulatory Visit (INDEPENDENT_AMBULATORY_CARE_PROVIDER_SITE_OTHER): Payer: BC Managed Care – PPO | Admitting: Family Medicine

## 2014-12-30 VITALS — BP 142/90 | Ht 66.0 in | Wt 235.1 lb

## 2014-12-30 DIAGNOSIS — E119 Type 2 diabetes mellitus without complications: Secondary | ICD-10-CM

## 2014-12-30 DIAGNOSIS — Z23 Encounter for immunization: Secondary | ICD-10-CM

## 2014-12-30 DIAGNOSIS — Z1231 Encounter for screening mammogram for malignant neoplasm of breast: Secondary | ICD-10-CM | POA: Diagnosis not present

## 2014-12-30 LAB — POCT GLYCOSYLATED HEMOGLOBIN (HGB A1C): HEMOGLOBIN A1C: 7.3

## 2014-12-30 MED ORDER — METFORMIN HCL 1000 MG PO TABS: ORAL_TABLET | ORAL | Status: DC

## 2014-12-30 MED ORDER — CEPHALEXIN 500 MG PO CAPS: 500.0000 mg | ORAL_CAPSULE | Freq: Four times a day (QID) | ORAL | Status: DC

## 2014-12-30 MED ORDER — AMLODIPINE BESYLATE 2.5 MG PO TABS: 2.5000 mg | ORAL_TABLET | Freq: Every day | ORAL | Status: DC

## 2014-12-30 MED ORDER — INSULIN GLARGINE 100 UNIT/ML SOLOSTAR PEN
PEN_INJECTOR | SUBCUTANEOUS | Status: DC
Start: 2014-12-30 — End: 2015-04-20

## 2014-12-30 MED ORDER — HYDROCODONE-ACETAMINOPHEN 5-325 MG PO TABS: 1.0000 | ORAL_TABLET | Freq: Four times a day (QID) | ORAL | Status: DC | PRN

## 2014-12-30 MED ORDER — LISINOPRIL-HYDROCHLOROTHIAZIDE 20-25 MG PO TABS: 1.0000 | ORAL_TABLET | Freq: Every day | ORAL | Status: DC

## 2014-12-30 MED ORDER — GLYBURIDE 5 MG PO TABS: ORAL_TABLET | ORAL | Status: DC

## 2014-12-30 NOTE — Progress Notes (Signed)
   Subjective:    Patient ID: [name], female    DOB: Nov 24, 1955, 59 y.o.   MRN: [id number]  Diabetes She presents for her follow-up diabetic visit. She has type 2 diabetes mellitus. No MedicAlert identification noted. Pertinent negatives for hypoglycemia include no confusion. Pertinent negatives for diabetes include no chest pain, no fatigue, no polydipsia, no polyphagia and no weakness. She has not had a previous visit with a dietitian. She does not see a podiatrist.Eye exam is current (May 2016).   Patient states no other concerns this visit.  Today's A1c is: 7.3    Review of Systems  Constitutional: Negative for activity change, appetite change and fatigue.  HENT: Negative for congestion.   Respiratory: Negative for cough.   Cardiovascular: Negative for chest pain.  Gastrointestinal: Negative for abdominal pain.  Endocrine: Negative for polydipsia and polyphagia.  Neurological: Negative for weakness.  Psychiatric/Behavioral: Negative for confusion.       Objective:   Physical Exam  Constitutional: She appears well-nourished. No distress.  Cardiovascular: Normal rate, regular rhythm and normal heart sounds.   No murmur heard. Pulmonary/Chest: Effort normal and breath sounds normal. No respiratory distress.  Musculoskeletal: She exhibits no edema.  Lymphadenopathy:    She has no cervical adenopathy.  Neurological: She is alert. She exhibits normal muscle tone.  Psychiatric: Her behavior is normal.  Vitals reviewed.         Assessment & Plan:   HTN subpar control add amlodipine 2.5 mg daily    hydrocodone for occasional back pain patient does not overuse it refill given    diabetes decent control she will watch diet closely continue current measures.    no need to repeat cholesterol profile currently

## 2015-01-17 ENCOUNTER — Other Ambulatory Visit: Payer: Self-pay | Admitting: Family Medicine

## 2015-02-11 ENCOUNTER — Encounter: Payer: Self-pay | Admitting: Family Medicine

## 2015-02-11 ENCOUNTER — Ambulatory Visit (INDEPENDENT_AMBULATORY_CARE_PROVIDER_SITE_OTHER): Payer: BC Managed Care – PPO | Admitting: Family Medicine

## 2015-02-11 VITALS — BP 122/80 | Temp 98.7°F | Ht 66.0 in | Wt 236.0 lb

## 2015-02-11 DIAGNOSIS — E1142 Type 2 diabetes mellitus with diabetic polyneuropathy: Secondary | ICD-10-CM | POA: Diagnosis not present

## 2015-02-11 DIAGNOSIS — T300 Burn of unspecified body region, unspecified degree: Secondary | ICD-10-CM | POA: Diagnosis not present

## 2015-02-11 DIAGNOSIS — IMO0002 Reserved for concepts with insufficient information to code with codable children: Secondary | ICD-10-CM

## 2015-02-11 MED ORDER — MUPIROCIN 2 % EX OINT: TOPICAL_OINTMENT | CUTANEOUS | Status: DC

## 2015-02-11 MED ORDER — CEFPROZIL 500 MG PO TABS: 500.0000 mg | ORAL_TABLET | Freq: Two times a day (BID) | ORAL | Status: DC

## 2015-02-11 MED ORDER — FLUCONAZOLE 150 MG PO TABS: 150.0000 mg | ORAL_TABLET | Freq: Once | ORAL | Status: DC
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
## 2015-03-01 ENCOUNTER — Ambulatory Visit (INDEPENDENT_AMBULATORY_CARE_PROVIDER_SITE_OTHER): Payer: BC Managed Care – PPO | Admitting: Family Medicine

## 2015-03-01 VITALS — BP 136/88 | Ht 66.0 in | Wt 232.6 lb

## 2015-03-01 DIAGNOSIS — T300 Burn of unspecified body region, unspecified degree: Secondary | ICD-10-CM

## 2015-03-01 DIAGNOSIS — IMO0002 Reserved for concepts with insufficient information to code with codable children: Secondary | ICD-10-CM
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
## 2015-03-04 ENCOUNTER — Other Ambulatory Visit: Payer: Self-pay | Admitting: Family Medicine

## 2015-04-01 ENCOUNTER — Other Ambulatory Visit: Payer: Self-pay | Admitting: Family Medicine

## 2015-04-01 NOTE — Telephone Encounter (Signed)
May have this and 3 refills 

## 2015-04-19 ENCOUNTER — Telehealth: Payer: Self-pay | Admitting: Family Medicine

## 2015-04-19 NOTE — Telephone Encounter (Signed)
Levemir works the same way. May use 24 units each evening. Please talk with patient informing her of this decision by her insurance company. Inform her that we feel Levemir will also do a good job for her. Please send in the appropriate prescription. May have 30 day with 5 refills

## 2015-04-19 NOTE — Telephone Encounter (Signed)
Fax rec'd from [facility] - see red folder  Pt's Insulin Glargine (LANTUS SOLOSTAR) 100 UNIT/ML Solostar Pen is non-preferred  Pt's insurance prefers [employer] or Levemir or [employer]  Change to a preferred or try to get prior auth?  Please advise

## 2015-04-20 MED ORDER — INSULIN DETEMIR 100 UNIT/ML ~~LOC~~ SOLN: 24.0000 [IU] | Freq: Every day | SUBCUTANEOUS | Status: DC

## 2015-04-20 NOTE — Telephone Encounter (Signed)
Patient notified and would like to proceed with the levemir.  Rx sent to pharmacy.

## 2015-04-20 NOTE — Telephone Encounter (Signed)
[facility] (med already sent in)

## 2015-06-01 ENCOUNTER — Other Ambulatory Visit: Payer: Self-pay | Admitting: Family Medicine

## 2015-06-13 ENCOUNTER — Ambulatory Visit: Payer: BC Managed Care – PPO | Admitting: Family Medicine

## 2015-06-15 ENCOUNTER — Encounter: Payer: Self-pay | Admitting: Family Medicine

## 2015-06-15 ENCOUNTER — Ambulatory Visit (INDEPENDENT_AMBULATORY_CARE_PROVIDER_SITE_OTHER): Payer: BC Managed Care – PPO | Admitting: Family Medicine

## 2015-06-15 VITALS — BP 126/76 | Ht 66.0 in | Wt 231.5 lb

## 2015-06-15 DIAGNOSIS — G4700 Insomnia, unspecified: Secondary | ICD-10-CM | POA: Diagnosis not present

## 2015-06-15 DIAGNOSIS — I10 Essential (primary) hypertension: Secondary | ICD-10-CM

## 2015-06-15 DIAGNOSIS — E119 Type 2 diabetes mellitus without complications: Secondary | ICD-10-CM

## 2015-06-15 DIAGNOSIS — E785 Hyperlipidemia, unspecified: Secondary | ICD-10-CM | POA: Diagnosis not present

## 2015-06-15 LAB — POCT GLYCOSYLATED HEMOGLOBIN (HGB A1C): HEMOGLOBIN A1C: 8.2

## 2015-06-15 MED ORDER — EMPAGLIFLOZIN 10 MG PO TABS: 10.0000 mg | ORAL_TABLET | Freq: Every day | ORAL | Status: DC

## 2015-06-15 MED ORDER — GLYBURIDE 5 MG PO TABS: ORAL_TABLET | ORAL | Status: DC

## 2015-06-15 MED ORDER — LISINOPRIL 20 MG PO TABS: 20.0000 mg | ORAL_TABLET | Freq: Every day | ORAL | Status: DC

## 2015-06-15 MED ORDER — TRAZODONE HCL 100 MG PO TABS: ORAL_TABLET | ORAL | Status: DC

## 2015-06-15 NOTE — Progress Notes (Signed)
   Subjective:    Patient ID: [name], female    DOB: 1955/09/10, 60 y.o.   MRN: [id number]  Diabetes She presents for her follow-up diabetic visit. She has type 2 diabetes mellitus. No MedicAlert identification noted. She has not had a previous visit with a dietitian. She does not see a podiatrist.Eye exam is current (May 2016).  Patient taken her diabetic medicine as directed not having any problems Taken cholesterol medicine as directed doing well with this watching diet Patient states she's not excising way she should Having difficult time sleeping we discussed multiple options she will try trazodone half tablet to a whole tablet as necessary cautioned drowsiness If this doesn't work out try Ambien 25 minutes was spent with the patient. Greater than half the time was spent in discussion and answering questions and counseling regarding the issues that the patient came in for today.  Patient states no other concerns this visit.  Results for orders placed or performed in visit on 06/15/15  POCT HgB A1C  Result Value Ref Range   Hemoglobin A1C 8.2     Review of Systems She denies any headaches sweats nausea vomiting diarrhea denies chest tightness pressure pain abdominal pain she does relate some orthopedic pain in her knees    Objective:   Physical Exam Neck no masses lungs clear no crackles heart regular no murmurs pulse normal diabetic foot exam normal except for some mild neuropathy       Assessment & Plan:  Diabetes subpar control we discussed multiple different options. Increase long-acting insulin to 28 units at night. 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

## 2015-06-27 ENCOUNTER — Telehealth: Payer: Self-pay | Admitting: Family Medicine

## 2015-06-27 NOTE — Telephone Encounter (Signed)
Pt has faxed in a letter from DOT, glucose readings and info for you to  Type up a letter to be sent in stating her readings are good and she is  Ok to drive [place]   Put this info in your yellow folder in your office

## 2015-06-29 ENCOUNTER — Telehealth: Payer: Self-pay | Admitting: Family Medicine

## 2015-06-29 ENCOUNTER — Encounter: Payer: Self-pay | Admitting: Family Medicine

## 2015-06-29 NOTE — Telephone Encounter (Signed)
Pt notified, letter sent out by Danae Chen

## 2015-06-29 NOTE — Telephone Encounter (Signed)
All of this information was addressed and forwarded to [facility]

## 2015-06-29 NOTE — Telephone Encounter (Signed)
Please send the letter that I did along with the glucose logs and other letters from Department of [employer]. Please submit them via the fax number listed on that sheet as well as mailing a copy out today. In addition to this please contact the patient to let her know that this has been sent she put a confirmation email address on her letter dated from the 24th if we could send that to her that would be greatly appreciated this is a time sensitive issue please handle today if possible thank you

## 2015-06-30 ENCOUNTER — Ambulatory Visit: Payer: BC Managed Care – PPO | Admitting: Family Medicine

## 2015-07-21 ENCOUNTER — Other Ambulatory Visit: Payer: Self-pay | Admitting: Family Medicine

## 2015-08-04 ENCOUNTER — Other Ambulatory Visit: Payer: Self-pay | Admitting: Family Medicine

## 2015-08-31 LAB — HEPATIC FUNCTION PANEL
ALK PHOS: 86 IU/L (ref 39–117)
ALT: 15 IU/L (ref 0–32)
AST: 14 IU/L (ref 0–40)
Albumin: 4.4 g/dL (ref 3.6–4.8)
BILIRUBIN TOTAL: 0.5 mg/dL (ref 0.0–1.2)
BILIRUBIN, DIRECT: 0.13 mg/dL (ref 0.00–0.40)
Total Protein: 7 g/dL (ref 6.0–8.5)

## 2015-08-31 LAB — MICROALBUMIN / CREATININE URINE RATIO
Creatinine, Urine: 41.3 mg/dL
MICROALB/CREAT RATIO: <7.3 mg/g creat (ref 0.0–30.0)
Microalbumin, Urine: <3 ug/mL

## 2015-08-31 LAB — LIPID PANEL
CHOL/HDL RATIO: 2.3 ratio (ref 0.0–4.4)
Cholesterol, Total: 141 mg/dL (ref 100–199)
HDL: 61 mg/dL (ref >39)
LDL Calculated: 47 mg/dL (ref 0–99)
TRIGLYCERIDES: 167 mg/dL — AB (ref 0–149)
VLDL Cholesterol Cal: 33 mg/dL (ref 5–40)

## 2015-08-31 LAB — BASIC METABOLIC PANEL
BUN / CREAT RATIO: 23 (ref 12–28)
BUN: 30 mg/dL — AB (ref 8–27)
CHLORIDE: 101 mmol/L (ref 96–106)
CO2: 21 mmol/L (ref 18–29)
CREATININE: 1.32 mg/dL — AB (ref 0.57–1.00)
Calcium: 9.9 mg/dL (ref 8.7–10.3)
GFR calc non Af Amer: 44 mL/min/{1.73_m2} — ABNORMAL LOW (ref >59)
GFR, EST AFRICAN AMERICAN: 50 mL/min/{1.73_m2} — AB (ref >59)
GLUCOSE: 156 mg/dL — AB (ref 65–99)
Potassium: 4.7 mmol/L (ref 3.5–5.2)
SODIUM: 138 mmol/L (ref 134–144)

## 2015-09-01 NOTE — Addendum Note (Signed)
Addended by: Ofilia Neas R on: 09/01/2015 09:30 AM   Modules accepted: Orders

## 2015-09-20 ENCOUNTER — Other Ambulatory Visit: Payer: Self-pay | Admitting: Family Medicine

## 2015-10-04 ENCOUNTER — Ambulatory Visit: Payer: BC Managed Care – PPO | Admitting: Family Medicine

## 2015-10-06 LAB — BASIC METABOLIC PANEL
BUN/Creatinine Ratio: 20 (ref 12–28)
BUN: 22 mg/dL (ref 8–27)
CALCIUM: 9.9 mg/dL (ref 8.7–10.3)
CO2: 23 mmol/L (ref 18–29)
Chloride: 98 mmol/L (ref 96–106)
Creatinine, Ser: 1.12 mg/dL — ABNORMAL HIGH (ref 0.57–1.00)
GFR, EST AFRICAN AMERICAN: 61 mL/min/{1.73_m2} (ref >59)
GFR, EST NON AFRICAN AMERICAN: 53 mL/min/{1.73_m2} — AB (ref >59)
Glucose: 308 mg/dL — ABNORMAL HIGH (ref 65–99)
Potassium: 5 mmol/L (ref 3.5–5.2)
Sodium: 138 mmol/L (ref 134–144)

## 2015-10-10 ENCOUNTER — Ambulatory Visit: Payer: BC Managed Care – PPO | Admitting: Family Medicine

## 2015-10-18 ENCOUNTER — Ambulatory Visit (INDEPENDENT_AMBULATORY_CARE_PROVIDER_SITE_OTHER): Payer: BC Managed Care – PPO | Admitting: Family Medicine

## 2015-10-18 ENCOUNTER — Encounter: Payer: Self-pay | Admitting: Family Medicine

## 2015-10-18 VITALS — BP 128/82 | Ht 66.0 in | Wt 232.8 lb

## 2015-10-18 DIAGNOSIS — Z1211 Encounter for screening for malignant neoplasm of colon: Secondary | ICD-10-CM | POA: Diagnosis not present

## 2015-10-18 DIAGNOSIS — E119 Type 2 diabetes mellitus without complications: Secondary | ICD-10-CM

## 2015-10-18 DIAGNOSIS — E785 Hyperlipidemia, unspecified: Secondary | ICD-10-CM

## 2015-10-18 DIAGNOSIS — I10 Essential (primary) hypertension: Secondary | ICD-10-CM | POA: Diagnosis not present

## 2015-10-18 DIAGNOSIS — I350 Nonrheumatic aortic (valve) stenosis: Secondary | ICD-10-CM

## 2015-10-18 DIAGNOSIS — N289 Disorder of kidney and ureter, unspecified: Secondary | ICD-10-CM | POA: Diagnosis not present

## 2015-10-18 DIAGNOSIS — E041 Nontoxic single thyroid nodule: Secondary | ICD-10-CM | POA: Diagnosis not present

## 2015-10-18 DIAGNOSIS — G4700 Insomnia, unspecified: Secondary | ICD-10-CM

## 2015-10-18 MED ORDER — LISINOPRIL 20 MG PO TABS: 20.0000 mg | ORAL_TABLET | Freq: Every day | ORAL | 5 refills | Status: DC

## 2015-10-18 MED ORDER — PRAVASTATIN SODIUM 20 MG PO TABS: ORAL_TABLET | ORAL | 5 refills | Status: DC

## 2015-10-18 MED ORDER — GLYBURIDE 5 MG PO TABS: ORAL_TABLET | ORAL | 5 refills | Status: DC

## 2015-10-18 MED ORDER — EMPAGLIFLOZIN 10 MG PO TABS: 10.0000 mg | ORAL_TABLET | Freq: Every day | ORAL | 5 refills | Status: DC

## 2015-10-18 MED ORDER — INSULIN DETEMIR 100 UNIT/ML ~~LOC~~ SOLN: 24.0000 [IU] | Freq: Every day | SUBCUTANEOUS | 5 refills | Status: DC

## 2015-10-18 MED ORDER — TRAZODONE HCL 100 MG PO TABS: ORAL_TABLET | ORAL | 5 refills | Status: DC

## 2015-10-18 MED ORDER — METFORMIN HCL 1000 MG PO TABS: ORAL_TABLET | ORAL | 5 refills | Status: DC

## 2015-10-18 MED ORDER — HYDROCODONE-ACETAMINOPHEN 5-325 MG PO TABS: 1.0000 | ORAL_TABLET | Freq: Four times a day (QID) | ORAL | 0 refills | Status: DC | PRN

## 2015-10-18 MED ORDER — AMLODIPINE BESYLATE 2.5 MG PO TABS: 2.5000 mg | ORAL_TABLET | Freq: Every day | ORAL | 5 refills | Status: DC

## 2015-10-18 NOTE — Progress Notes (Signed)
   Subjective:    Patient ID: [name], female    DOB: [birth year], 60 y.o.   MRN: [id number]  Diabetes  She presents for her follow-up diabetic visit. She has type 2 diabetes mellitus. Risk factors for coronary artery disease include diabetes mellitus, dyslipidemia, hypertension, obesity and post-menopausal. Current diabetic treatment includes insulin injections and oral agent (monotherapy). She is compliant with treatment all of the time. Her weight is stable. She is following a diabetic diet.   Patient able to do some walking denies any chest tightness pressure pain denies shortness of breath Patient does use trazodone at nighttime to help her sleep does well occasionally uses Benadryl when she forgets the outer She takes her cholesterol medicine recent lab work reviewed with patient tolerates medicine well watch his diet Denies any significant burning in her feet but does have some numbness Takes her blood pressure medicine on a regular basis Recent metabolic 7 showed improvement in kidney function Patient has a history of aortic stenosis stable  Review of Systems Currently denies chest tightness pressure pain shortness breath nausea vomiting diarrhea    Objective:   Physical Exam HEENT benign neck no masses lungs are clear no crackles heart regular pulses normal extremities no edema foot exam see diabetic foot exam       Assessment & Plan:  1. Diabetes mellitus without complication ([place]) Diabetes 7.7 the goal would be to get it below 7. Patient will monitor glucose readings send them to [place] we may need to adjust her medications. Follow-up 4 months to repeat A1c. - POCT glycosylated hemoglobin (Hb A1C)  2. Aortic stenosis Patient denies any shortness of breath she is starting to do more exercise. I read through cardiology notes they recommend a follow-up echo. Patient passed due for this. We will refer her back to cardiology for further evaluation and echo  3. Essential  hypertension Blood pressure good control continue current medicine  4. Renal insufficiency Recent metabolic 7 actually looks good continue current medicine  5. Hyperlipidemia Cholesterol profile from 6 weeks ago looks great continue medication doing well with this  6. Insomnia Uses trazodone this seems to help well.  7. Thyroid nodule Patient does need a 12 month follow-up of her thyroid nodule. This will be set up.  Patient consents to being set up for colonoscopy last one 10 years ago with Dr. Felipe Drone   25 minutes was spent with the patient. Greater than half the time was spent in discussion and answering questions and counseling regarding the issues that the patient came in for today.

## 2015-10-19 ENCOUNTER — Encounter: Payer: Self-pay | Admitting: Family Medicine

## 2015-10-21 ENCOUNTER — Ambulatory Visit (HOSPITAL_COMMUNITY)
Admission: RE | Admit: 2015-10-21 | Discharge: 2015-10-21 | Disposition: A | Discharge status: Home / Self Care | Payer: BC Managed Care – PPO | Source: Ambulatory Visit | Attending: Family Medicine | Admitting: Family Medicine

## 2015-10-21 DIAGNOSIS — E041 Nontoxic single thyroid nodule: Secondary | ICD-10-CM | POA: Diagnosis present

## 2015-10-21 DIAGNOSIS — E042 Nontoxic multinodular goiter: Secondary | ICD-10-CM | POA: Diagnosis not present

## 2015-10-21 LAB — POCT GLYCOSYLATED HEMOGLOBIN (HGB A1C): Hemoglobin A1C: 7.7

## 2015-10-25 NOTE — Progress Notes (Signed)
Cardiology Office Note  Date: 10/26/2015   ID: [name], [name] 1955-10-23, MRN [id number]  PCP: Tina Lange, MD  Primary Cardiologist: Tina Lesches, MD   Chief Complaint  Patient presents with  . Aortic Stenosis    History of Present Illness: [name] is a 60 y.o. female that I have not seen in the office since July 2014. She is referred back by [name] for follow-up assessment of aortic stenosis that had progressed by her prior transthoracic echocardiogram in 2014. When I saw her in the office, I recommended further evaluation and close follow-up. She has not returned for follow-up until today.  She works in [employer] for [employer]. She does not endorse any specific decline in her typical functional capacity. She has chronic dyspnea on exertion which is in the NYHA class II range based on her description of activities, worse if she had to walk up a long flight of stairs. She does not report any angina or palpitations, has not had syncope.  Chest CT angiogram in 2014 did not demonstrate any significant ascending aortic aneurysm in association with her aortic stenosis.  Today we discussed the natural history of aortic stenosis, typical progression pattern particularly in the setting of a bicuspid valve which is suspected based on her last echocardiogram. I reviewed her ECG today which shows normal sinus rhythm.  Past Medical History:  Diagnosis Date  . Aortic stenosis    Mild 5/12  . Essential hypertension, benign   . Mixed hyperlipidemia   . Restless leg syndrome   . Ruptured disk   . Type 2 diabetes mellitus ([place])     Past Surgical History:  Procedure Laterality Date  . COLONOSCOPY      Current Outpatient Prescriptions  Medication Sig Dispense Refill  . ACCU-CHEK AVIVA PLUS test strip TEST DAILY AS DIRECTED. 50 each 5  . ACCU-CHEK FASTCLIX LANCETS MISC USE DAILY AS DIRECTED. 102 each 5  . amLODipine (NORVASC) 2.5 MG tablet Take 1  tablet (2.5 mg total) by mouth daily. 30 tablet 5  . aspirin 81 MG tablet Take 81 mg by mouth daily.    . B-D UF III MINI PEN NEEDLES 31G X 5 MM MISC USE AS DIRECTED. 100 each 5  . empagliflozin (JARDIANCE) 10 MG TABS tablet Take 10 mg by mouth daily. 30 tablet 5  . glyBURIDE (DIABETA) 5 MG tablet TAKE 1 tablet by mouth every morning and 1 tablet with supper. 60 tablet 5  . insulin detemir (LEVEMIR) 100 UNIT/ML injection Inject 0.24 mLs (24 Units total) into the skin at bedtime. 10 mL 5  . lisinopril (PRINIVIL,ZESTRIL) 20 MG tablet Take 1 tablet (20 mg total) by mouth daily. 30 tablet 5  . metFORMIN (GLUCOPHAGE) 1000 MG tablet TAKE (1) TABLET BY MOUTH TWICE DAILY. 60 tablet 5  . pravastatin (PRAVACHOL) 20 MG tablet TAKE 1 TABLET BY MOUTH AT BEDTIME FOR CHOLESTEROL. 30 tablet 5  . traZODone (DESYREL) 100 MG tablet Take 1/2 to 1 tablet as needed for bedtime. 30 tablet 5   No current facility-administered medications for this visit.    Allergies:  Review of patient's allergies indicates no known allergies.   Social History: The patient  reports that she has never smoked. She has never used smokeless tobacco. She reports that she does not drink alcohol or use drugs.   Family History: The patient's family history includes Coronary artery disease in her father; Diabetes in her brother and mother; Diabetes type  II in her brother and mother; Hyperlipidemia in her sister; Hypertension in her father, mother, and sister.   ROS:  Please see the history of present illness. Otherwise, complete review of systems is positive for arthritic pains.  All other systems are reviewed and negative.   Physical Exam: VS:  BP 134/72 (BP Location: Right Arm, Cuff Size: Large)   Pulse 92   Ht 5\' 6"  (1.676 m)   Wt 228 lb (103.4 kg)   SpO2 99%   BMI 36.80 kg/m , BMI Body mass index is 36.8 kg/m.  Wt Readings from Last 3 Encounters:  10/26/15 228 lb (103.4 kg)  10/18/15 232 lb 12.8 oz (105.6 kg)  06/15/15 231 lb 8  oz (105 kg)    General: Overweight woman, appears comfortable at rest. HEENT: Conjunctiva and lids normal, oropharynx clear. Neck: Supple, no elevated JVP or carotid bruits, no thyromegaly. Lungs: Clear to auscultation, nonlabored breathing at rest. Cardiac: Regular rate and rhythm, no S3, 3/6 systolic murmur, decreased second heart sound at the right base, no obvious diastolic murmur, no pericardial rub. Abdomen: Soft, nontender, bowel sounds present, no guarding or rebound. Extremities: No pitting edema, distal pulses 2+. Skin: Warm and dry. Musculoskeletal: No kyphosis. Neuropsychiatric: Alert and oriented x3, affect grossly appropriate.  ECG: I personally reviewed the tracing from 09/10/2012 which showed normal sinus rhythm.   Recent Labwork: 08/30/2015: ALT 15; AST 14 10/05/2015: BUN 22; Creatinine, Ser 1.12; Potassium 5.0; Sodium 138     Component Value Date/Time   CHOL 141 08/30/2015 0840   TRIG 167 (H) 08/30/2015 0840   HDL 61 08/30/2015 0840   CHOLHDL 2.3 08/30/2015 0840   CHOLHDL 2.5 07/02/2014 0714   VLDL 35 07/02/2014 0714   LDLCALC 47 08/30/2015 0840    Other Studies Reviewed Today:  Echocardiogram 09/04/2012: Study Conclusions  - Left ventricle: The cavity size was normal. Wall thickness was increased in a pattern of moderate LVH. Systolic function was vigorous. The estimated ejection fraction was in the range of 65% to 70%. Doppler parameters are consistent with abnormal left ventricular relaxation (grade 1 diastolic dysfunction). - Aortic valve: AV is difficult to see well. Thickened, calcified. Appears dysplastic. Peak and mean gradients through the valve are 78 and 48 mm Hg respectively consistent with severe AS. LV function is vigorous but gradient does not appear to be due to outflow obstruction. Compared to echo from 2012 this is a significant increase in gradients across valve. Consider TEE to further evaluate  Chest CT  09/12/2012: IMPRESSION:  1.  No aortic aneurysm or acute arterial abnormality. 2.  Marked aortic valve thickening and calcification.  Recommend echocardiographic correlation. 3. LAD atherosclerotic calcification. 4. Eleven mm right thyroid nodule.  Assessment and Plan:  1. Suspected bicuspid aortic valve with moderate to severe stenosis by echocardiogram in 2014, overall limited images. We will proceed with a follow-up study for reevaluation. We did discuss the natural history of progression and management options including surgical AVR.  2. Essential hypertension, blood pressure is adequately controlled today. She is on Norvasc and lisinopril.  3. Hyperlipidemia, on Pravachol, recent LDL 47.  4. Type 2 diabetes mellitus, followed by [name].  Current medicines were reviewed with the patient today.   Orders Placed This Encounter  Procedures  . EKG 12-Lead  . ECHOCARDIOGRAM COMPLETE    Disposition: Review echocardiogram and determine follow-up plan.  Signed, Satira Sark, MD, [facility] 10/26/2015 9:30 AM    [place] [facility]  [address], [place], [place] [address] Phone: [phone]; Fax: [phone]

## 2015-10-26 ENCOUNTER — Encounter: Payer: Self-pay | Admitting: Cardiology

## 2015-10-26 ENCOUNTER — Encounter (INDEPENDENT_AMBULATORY_CARE_PROVIDER_SITE_OTHER): Payer: Self-pay

## 2015-10-26 ENCOUNTER — Ambulatory Visit (INDEPENDENT_AMBULATORY_CARE_PROVIDER_SITE_OTHER): Payer: BC Managed Care – PPO | Admitting: Cardiology

## 2015-10-26 VITALS — BP 134/72 | HR 92 | Ht 66.0 in | Wt 228.0 lb

## 2015-10-26 DIAGNOSIS — E785 Hyperlipidemia, unspecified: Secondary | ICD-10-CM | POA: Diagnosis not present

## 2015-10-26 DIAGNOSIS — E119 Type 2 diabetes mellitus without complications: Secondary | ICD-10-CM

## 2015-10-26 DIAGNOSIS — I10 Essential (primary) hypertension: Secondary | ICD-10-CM | POA: Diagnosis not present

## 2015-10-26 DIAGNOSIS — I350 Nonrheumatic aortic (valve) stenosis: Secondary | ICD-10-CM | POA: Diagnosis not present

## 2015-10-26 NOTE — Patient Instructions (Signed)
Medication Instructions:  Your physician recommends that you continue on your current medications as directed. Please refer to the Current Medication list given to you today.   Labwork: none  Testing/Procedures: Your physician has requested that you have an echocardiogram. Echocardiography is a painless test that uses sound waves to create images of your heart. It provides your doctor with information about the size and shape of your heart and how well your heart's chambers and valves are working. This procedure takes approximately one hour. There are no restrictions for this procedure.    Follow-Up: Your physician recommends that you schedule a follow-up appointment in: To be determined based on echo results    Any Other Special Instructions Will Be Listed Below (If Applicable).     If you need a refill on your cardiac medications before your next appointment, please call your pharmacy.

## 2015-11-09 ENCOUNTER — Telehealth: Payer: Self-pay

## 2015-11-09 ENCOUNTER — Ambulatory Visit (HOSPITAL_COMMUNITY)
Admission: RE | Admit: 2015-11-09 | Discharge: 2015-11-09 | Disposition: A | Discharge status: Home / Self Care | Payer: BC Managed Care – PPO | Source: Ambulatory Visit | Attending: Cardiology | Admitting: Cardiology

## 2015-11-09 DIAGNOSIS — E785 Hyperlipidemia, unspecified: Secondary | ICD-10-CM | POA: Diagnosis not present

## 2015-11-09 DIAGNOSIS — E119 Type 2 diabetes mellitus without complications: Secondary | ICD-10-CM | POA: Diagnosis not present

## 2015-11-09 DIAGNOSIS — I515 Myocardial degeneration: Secondary | ICD-10-CM | POA: Diagnosis not present

## 2015-11-09 DIAGNOSIS — Q231 Congenital insufficiency of aortic valve: Secondary | ICD-10-CM | POA: Insufficient documentation

## 2015-11-09 DIAGNOSIS — I350 Nonrheumatic aortic (valve) stenosis: Secondary | ICD-10-CM | POA: Diagnosis not present

## 2015-11-09 DIAGNOSIS — I358 Other nonrheumatic aortic valve disorders: Secondary | ICD-10-CM | POA: Diagnosis not present

## 2015-11-09 DIAGNOSIS — I119 Hypertensive heart disease without heart failure: Secondary | ICD-10-CM | POA: Insufficient documentation

## 2015-11-09 DIAGNOSIS — I071 Rheumatic tricuspid insufficiency: Secondary | ICD-10-CM | POA: Diagnosis not present

## 2015-11-09 NOTE — Telephone Encounter (Signed)
Spoke with pt,scheduled gxt with dr Domenic Polite on 9/28, at 930 am,pt to arrive at 69 am, will be NPO and take no diabetic meds.She states she has had a gxt before

## 2015-11-09 NOTE — Telephone Encounter (Signed)
-----   Message from Satira Sark, MD sent at 11/09/2015  3:39 PM EDT ----- Results reviewed. She has a bicuspid aortic valve with evidence of severe aortic stenosis. LVEF remains normal. She did not describe any obvious decline in functional capacity at her recent visit, but I would suggest that this gets more objectively evaluated so that we can determine how to proceed in terms of considering the possibility of valve replacement. Please schedule a GXT on a day when I can supervise the test at [facility]. A copy of this test should be forwarded to Sallee Lange, MD.

## 2015-11-09 NOTE — Progress Notes (Signed)
*  PRELIMINARY RESULTS* Echocardiogram 2D Echocardiogram has been performed.  [name] 11/09/2015, 1:58 PM

## 2015-12-01 ENCOUNTER — Ambulatory Visit (HOSPITAL_COMMUNITY)
Admission: RE | Admit: 2015-12-01 | Discharge: 2015-12-01 | Disposition: A | Discharge status: Home / Self Care | Payer: BC Managed Care – PPO | Source: Ambulatory Visit | Attending: Cardiology | Admitting: Cardiology

## 2015-12-01 ENCOUNTER — Telehealth: Payer: Self-pay

## 2015-12-01 DIAGNOSIS — I350 Nonrheumatic aortic (valve) stenosis: Secondary | ICD-10-CM | POA: Insufficient documentation

## 2015-12-01 LAB — EXERCISE TOLERANCE TEST
CHL CUP MPHR: 159 {beats}/min
CHL CUP RESTING HR STRESS: 80 {beats}/min
CSEPEDS: 11 s
CSEPEW: 7 METS
CSEPPHR: 150 {beats}/min
Exercise duration (min): 4 min
Percent HR: 94 %
RPE: 15

## 2015-12-01 NOTE — Telephone Encounter (Signed)
Have LM  For pt for 2 days,needs ref to [facility] for co for aortic valve surgery, consult placed,await call from pt

## 2015-12-01 NOTE — Telephone Encounter (Signed)
-----   Message from Satira Sark, MD sent at 12/01/2015  9:52 AM EDT ----- Results reviewed. Abnormal GXT with impaired exercise capacity as outlined. I discussed the results with the patient at the time of the test. She needs to be referred to [facility] to discuss surgical management of severe aortic stenosis. A copy of this test should be forwarded to Sallee Lange, MD.

## 2015-12-07 ENCOUNTER — Encounter: Payer: Self-pay | Admitting: Surgery

## 2015-12-07 ENCOUNTER — Institutional Professional Consult (permissible substitution) (INDEPENDENT_AMBULATORY_CARE_PROVIDER_SITE_OTHER): Payer: BC Managed Care – PPO | Admitting: Surgery

## 2015-12-07 VITALS — BP 170/84 | HR 94 | Resp 16 | Ht 66.0 in | Wt 232.0 lb

## 2015-12-07 DIAGNOSIS — I350 Nonrheumatic aortic (valve) stenosis: Secondary | ICD-10-CM

## 2015-12-09 ENCOUNTER — Telehealth: Payer: Self-pay

## 2015-12-09 ENCOUNTER — Encounter: Payer: Self-pay | Admitting: Surgery

## 2015-12-09 ENCOUNTER — Other Ambulatory Visit: Payer: Self-pay | Admitting: Cardiology

## 2015-12-09 ENCOUNTER — Encounter: Payer: Self-pay | Admitting: Cardiology

## 2015-12-09 DIAGNOSIS — I350 Nonrheumatic aortic (valve) stenosis: Secondary | ICD-10-CM

## 2015-12-09 DIAGNOSIS — Z01818 Encounter for other preprocedural examination: Secondary | ICD-10-CM

## 2015-12-09 NOTE — Telephone Encounter (Signed)
Cath Friday Oct 13 th at 7:30 am with Dr Theodosia Paling to arrive at 5:30 am Pt understands to get chest x-ray and lab work early next week.Instructions for cath are at front desk,orders placed for cxr and labs to be done at [facility]. I left vm on pt's cell phone per her request with date and time of cath

## 2015-12-09 NOTE — Telephone Encounter (Signed)
-----   Message from Satira Sark, MD sent at 12/09/2015  2:06 PM EDT ----- Ms. [name] needs to be scheduled for a left heart catheterization as part of a preoperative evaluation prior to aortic valve replacement by Dr. Cyndia Bent. If you could please check with her about a convenient day to get her on the schedule and then let me know the provider, I will take care of the orders.

## 2015-12-09 NOTE — Progress Notes (Signed)
Cardiothoracic Surgery Consultation  PCP is Sallee Lange, MD Referring Provider is Satira Sark, MD  Chief Complaint  Patient presents with  . Aortic Stenosis    ECHO 11/09/15. EXERCISE TOL. TEST 12/01/15    HPI:  The patient is a 60 year old woman with hypertension, hyperlipidemia, and type 2 DM who has a history of severe aortic stenosis. She had an echo in 09/2012 that showed a mean AV gradient of 48 mm Hg with a thickened and calcified aortic valve. By report this was a significant increase in gradient from her prior echo in 2012. Her most recent echo on 11/09/2015 was a better study and showed a bicuspid aortic valve with moderately calcified leaflets and reduced leaflet separation with an increase in the mean gradient to 52 mm Hg with a peak of 88 mm Hg. LVEF was 65-70% with grade 1 diastolic dysfunction and moderate LVH. There was no significant MR. The aortic root was normal in size. She had a CTA of the chest on 09/12/2012 that showed a mildly enlarged ascending aorta. The aortic valve was calcified and thickened. There was atherosclerotic calcification in the LAD.   She reports feeling well overall. She does admit to some shortness of breath with heavy exertion or going up steps. 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 She denies chest pain or dizziness.  Since she felt well she underwent an ETT on 12/01/2015 which was early positive with abnormal ST depression inferiorly and laterally in late stage 1 with impaired exercise capacity. The test was stopped due to fatigue and shortness of breath.  Past Medical History:  Diagnosis Date  . Aortic stenosis    Mild 5/12  . Essential hypertension, benign   . Mixed hyperlipidemia   . Restless leg syndrome   . Ruptured disk   . Type 2 diabetes mellitus ([place])     Past Surgical History:  Procedure Laterality Date  . COLONOSCOPY      Family History    Problem Relation Age of Onset  . Coronary artery disease Father     Diagnosed in his 23s  . Hypertension Father   . Diabetes type II Mother   . Hypertension Mother   . Diabetes Mother   . Diabetes type II Brother   . Diabetes Brother   . Hypertension Sister   . Hyperlipidemia Sister     Social History Social History  Substance Use Topics  . Smoking status: Never Smoker  . Smokeless tobacco: Never Used  . Alcohol use No    Current Outpatient Prescriptions  Medication Sig Dispense Refill  . ACCU-CHEK AVIVA PLUS test strip TEST DAILY AS DIRECTED. 50 each 5  . ACCU-CHEK FASTCLIX LANCETS MISC USE DAILY AS DIRECTED. 102 each 5  . amLODipine (NORVASC) 2.5 MG tablet Take 1 tablet (2.5 mg total) by mouth daily. 30 tablet 5  . aspirin 81 MG tablet Take 81 mg by mouth daily.    . B-D UF III MINI PEN NEEDLES 31G X 5 MM MISC USE AS DIRECTED. 100 each 5  . empagliflozin (JARDIANCE) 10 MG TABS tablet Take 10 mg by mouth daily. 30 tablet 5  . glyBURIDE (DIABETA) 5 MG tablet TAKE 1 tablet by mouth every morning and 1 tablet with supper. 60 tablet 5  . insulin detemir (LEVEMIR) 100 UNIT/ML injection Inject 0.24 mLs (24 Units total) into the skin  at bedtime. 10 mL 5  . lisinopril (PRINIVIL,ZESTRIL) 20 MG tablet Take 1 tablet (20 mg total) by mouth daily. 30 tablet 5  . metFORMIN (GLUCOPHAGE) 1000 MG tablet TAKE (1) TABLET BY MOUTH TWICE DAILY. 60 tablet 5  . pravastatin (PRAVACHOL) 20 MG tablet TAKE 1 TABLET BY MOUTH AT BEDTIME FOR CHOLESTEROL. 30 tablet 5  . traZODone (DESYREL) 100 MG tablet Take 1/2 to 1 tablet as needed for bedtime. 30 tablet 5   No current facility-administered medications for this visit.     No Known Allergies  Review of Systems  Constitutional: Positive for fatigue. Negative for activity change, appetite change and unexpected weight change.  HENT:       Has not seen her dentist in several years and thinks she may have a problem with a filling.  Eyes: Negative.    Respiratory: Positive for shortness of breath. Negative for chest tightness.        With exertion  Cardiovascular: Negative for chest pain, palpitations and leg swelling.  Gastrointestinal: Negative.   Endocrine: Negative.   Genitourinary: Negative.   Musculoskeletal: Negative.   Allergic/Immunologic: Negative.   Neurological: Negative for dizziness, syncope and light-headedness.  Hematological: Negative.   Psychiatric/Behavioral: Negative.     BP (!) 170/84   Pulse 94   Resp 16   Ht 5\' 6"  (1.676 m)   Wt 232 lb (105.2 kg)   SpO2 98% Comment: ON RA  BMI 37.45 kg/m  Physical Exam  Constitutional: She is oriented to person, place, and time.  Obese woman in no distress  BSA 2.24 m2  HENT:  Head: Normocephalic and atraumatic.  Mouth/Throat: Oropharynx is clear and moist.  Teeth look like they are in grossly fair condition  Eyes: EOM are normal. Pupils are equal, round, and reactive to light.  Neck: Normal range of motion. Neck supple. No JVD present. Thyromegaly present.  Cardiovascular: Normal rate, regular rhythm and intact distal pulses.   Murmur heard. 3/6 systolic murmur RSB  Pulmonary/Chest: Effort normal and breath sounds normal. No respiratory distress.  Abdominal: Soft. Bowel sounds are normal. She exhibits no distension and no mass. There is no tenderness.  Musculoskeletal: Normal range of motion. She exhibits no edema.  Lymphadenopathy:    She has no cervical adenopathy.  Neurological: She is alert and oriented to person, place, and time. She has normal strength. No cranial nerve deficit or sensory deficit.  Skin: Skin is warm and dry.  Psychiatric: She has a normal mood and affect.    Diagnostic Tests:     *[place], [place] [address]                            [id number]  ------------------------------------------------------------------- Transthoracic  Echocardiography  Patient:    Kelbi, Cutlip MR #:       [id number] Study Date: 11/09/2015 Gender:     F Age:        34 Height:     167.6 cm Weight:     103.4 kg BSA:        2.24 m^2 Pt. Status: Room:   ATTENDING    Rozann Lesches,  M.D.  ORDERING     Rozann Lesches, M.D.  REFERRING    Rozann Lesches, M.D.  PERFORMING   [facility], [facility]  SONOGRAPHER  Alvino Chapel, RCS  cc:  ------------------------------------------------------------------- LV EF: 65% -   70%  ------------------------------------------------------------------- Indications:      Aortic stenosis 424.1.  ------------------------------------------------------------------- History:   Risk factors:  Hypertension. Diabetes mellitus. Dyslipidemia.  ------------------------------------------------------------------- Study Conclusions  - Left ventricle: The cavity size was normal. Wall thickness was   increased in a pattern of moderate LVH. Systolic function was   vigorous. The estimated ejection fraction was in the range of 65%   to 70%. Wall motion was normal; there were no regional wall   motion abnormalities. Doppler parameters are consistent with   abnormal left ventricular relaxation (grade 1 diastolic   dysfunction). - Aortic valve: Bicuspid; moderately calcified leaflets. Cusp   separation was reduced. There was severe stenosis. Mean gradient   (S): 52 mm Hg. Peak gradient (S): 88 mm Hg. VTI ratio of LVOT to   aortic valve: 0.26. Valve area (VTI): 0.9 cm^2. - Left atrium: The atrium was at the upper limits of normal in   size. - Right atrium: Central venous pressure (est): 3 mm Hg. - Atrial septum: No defect or patent foramen ovale was identified. - Tricuspid valve: There was trivial regurgitation. - Pulmonary arteries: PA peak pressure: 27 mm Hg (S). - Pericardium, extracardiac: A prominent pericardial fat pad was   present.  Impressions:  - Moderate LVH with LVEF 65-70%. Grade 1  diastolic dysfunction.   Upper normal left atrial chamber size. Moderately calcified,   bicuspid aortic valve with evidence of severe stenosis as   outlined above. Trivial tricuspid regurgitation with PASP 27   mmHg.  ------------------------------------------------------------------- Study data:  Comparison was made to the study of 09/14/2012.  Study status:  Routine.  Procedure:  The patient reported no pain pre or post test. Transthoracic echocardiography. Image quality was adequate.  Study completion:  There were no complications. Transthoracic echocardiography.  M-mode, complete 2D, spectral Doppler, and color Doppler.  Birthdate:  Patient birthdate: Jul 10, 1955.  Age:  Patient is 60 yr old.  Sex:  Gender: female. BMI: 36.8 kg/m^2.  Blood pressure:     189/96  Patient status: Inpatient.  Study date:  Study date: 11/09/2015. Study time: 01:08 PM.  Location:  Echo laboratory.  -------------------------------------------------------------------  ------------------------------------------------------------------- Left ventricle:  The cavity size was normal. Wall thickness was increased in a pattern of moderate LVH. Systolic function was vigorous. The estimated ejection fraction was in the range of 65% to 70%. Wall motion was normal; there were no regional wall motion abnormalities. Doppler parameters are consistent with abnormal left ventricular relaxation (grade 1 diastolic dysfunction).  ------------------------------------------------------------------- Aortic valve:   Bicuspid; moderately calcified leaflets. Cusp separation was reduced.  Doppler:   There was severe stenosis. There was no significant regurgitation.    VTI ratio of LVOT to aortic valve: 0.26. Valve area (VTI): 0.9 cm^2. Indexed valve area (VTI): 0.4 cm^2/m^2. Peak velocity ratio of LVOT to aortic valve: 0.21. Valve area (Vmax): 0.74 cm^2. Indexed valve area (Vmax): 0.33 cm^2/m^2. Mean velocity ratio of LVOT  to aortic valve: 0.21. Valve area (Vmean): 0.74 cm^2. Indexed valve area (Vmean): 0.33 cm^2/m^2.    Mean gradient (S): 52 mm Hg. Peak gradient (S): 88 mm Hg.  ------------------------------------------------------------------- Aorta:  Aortic root: The aortic root was normal in size.  ------------------------------------------------------------------- Mitral valve:   The valve appears to be grossly normal.  Doppler:  There was no significant regurgitation.  ------------------------------------------------------------------- Left atrium:  The atrium was at the upper limits of normal in size.   ------------------------------------------------------------------- Atrial septum:  No defect or patent foramen ovale was identified.   ------------------------------------------------------------------- Right ventricle:  The cavity size was normal. Systolic function was normal.  ------------------------------------------------------------------- Pulmonic valve:    The valve appears to be grossly normal. Doppler:  There was no significant regurgitation.  ------------------------------------------------------------------- Tricuspid valve:   The valve appears to be grossly normal. Doppler:  There was trivial regurgitation.  ------------------------------------------------------------------- Right atrium:  The atrium was normal in size.  ------------------------------------------------------------------- Pericardium:  A prominent pericardial fat pad was present. There was no pericardial effusion.  ------------------------------------------------------------------- Systemic veins: Inferior vena cava: The vessel was normal in size. The respirophasic diameter changes were in the normal range (>= 50%), consistent with normal central venous pressure.  ------------------------------------------------------------------- Measurements   Left ventricle                            Value           Reference  LV ID, ED, PLAX chordal           (L)     40.2  mm       43 - 52  LV ID, ES, PLAX chordal                   23.5  mm       23 - 38  LV fx shortening, PLAX chordal            42    %        >=29  LV PW thickness, ED                       13.4  mm       ---------  IVS/LV PW ratio, ED                       1.2            <=1.3  Stroke volume, 2D                         97    ml       ---------  Stroke volume/bsa, 2D                     43    ml/m^2   ---------  LV ejection fraction, 1-p A4C             59    %        ---------  LV end-diastolic volume, 2-p              65    ml       ---------  LV end-systolic volume, 2-p               26    ml       ---------  LV ejection fraction, 2-p                 60    %        ---------  Stroke volume, 2-p                        39  ml       ---------  LV end-diastolic volume/bsa, 2-p          29    ml/m^2   ---------  LV end-systolic volume/bsa, 2-p           11    ml/m^2   ---------  Stroke volume/bsa, 2-p                    17.3  ml/m^2   ---------  LV e&', lateral                            8.27  cm/s     ---------  LV E/e&', lateral                          8.04           ---------  LV e&', medial                             5.66  cm/s     ---------  LV E/e&', medial                           11.75          ---------  LV e&', average                            6.97  cm/s     ---------  LV E/e&', average                          9.55           ---------    Ventricular septum                        Value          Reference  IVS thickness, ED                         16.1  mm       ---------    LVOT                                      Value          Reference  LVOT ID, S                                21    mm       ---------  LVOT area                                 3.46  cm^2     ---------  LVOT peak velocity, S                     101   cm/s     ---------  LVOT mean velocity, S  69.9  cm/s      ---------  LVOT VTI, S                               28    cm       ---------  LVOT peak gradient, S                     4     mm Hg    ---------    Aortic valve                              Value          Reference  Aortic valve peak velocity, S             470   cm/s     ---------  Aortic valve mean velocity, S             327   cm/s     ---------  Aortic valve VTI, S                       108   cm       ---------  Aortic mean gradient, S                   52    mm Hg    ---------  Aortic peak gradient, S                   88    mm Hg    ---------  VTI ratio, LVOT/AV                        0.26           ---------  Aortic valve area, VTI                    0.9   cm^2     ---------  Aortic valve area/bsa, VTI                0.4   cm^2/m^2 ---------  Velocity ratio, peak, LVOT/AV             0.21           ---------  Aortic valve area, peak velocity          0.74  cm^2     ---------  Aortic valve area/bsa, peak               0.33  cm^2/m^2 ---------  velocity  Velocity ratio, mean, LVOT/AV             0.21           ---------  Aortic valve area, mean velocity          0.74  cm^2     ---------  Aortic valve area/bsa, mean               0.33  cm^2/m^2 ---------  velocity    Aorta                                     Value          Reference  Aortic root ID,  ED                        31    mm       ---------    Left atrium                               Value          Reference  LA ID, A-P, ES                            39    mm       ---------  LA ID/bsa, A-P                            1.74  cm/m^2   <=2.2  LA volume, S                              60.9  ml       ---------  LA volume/bsa, S                          27.2  ml/m^2   ---------  LA volume, ES, 1-p A4C                    57.6  ml       ---------  LA volume/bsa, ES, 1-p A4C                25.7  ml/m^2   ---------  LA volume, ES, 1-p A2C                    57.2  ml       ---------  LA volume/bsa, ES, 1-p A2C                25.5   ml/m^2   ---------    Mitral valve                              Value          Reference  Mitral E-wave peak velocity               66.5  cm/s     ---------  Mitral A-wave peak velocity               111   cm/s     ---------  Mitral deceleration time          (H)     563   ms       150 - 230  Mitral E/A ratio, peak                    0.6            ---------    Pulmonary arteries                        Value          Reference  PA pressure, S, DP  27    mm Hg    <=30    Tricuspid valve                           Value          Reference  Tricuspid regurg peak velocity            244   cm/s     ---------  Tricuspid peak RV-RA gradient             24    mm Hg    ---------    Systemic veins                            Value          Reference  Estimated CVP                             3     mm Hg    ---------    Right ventricle                           Value          Reference  RV ID, ED, PLAX                           27.8  mm       19 - 38  TAPSE                                     27.7  mm       ---------  RV pressure, S, DP                        27    mm Hg    <=30  RV s&', lateral, S                         13.1  cm/s     ---------  Legend: (L)  and  (H)  mark values outside specified reference range.  ------------------------------------------------------------------- Prepared and Electronically Authenticated by  Rozann Lesches, M.D. 2017-09-06T15:18:28   *RADIOLOGY REPORT*  Clinical Data: Rule out aortic aneurysm.  CT ANGIOGRAPHY CHEST  Technique:  Multidetector CT imaging of the chest using the standard protocol during bolus administration of intravenous contrast. Multiplanar reconstructed images including MIPs were obtained and reviewed to evaluate the vascular anatomy.  Contrast: 128mL OMNIPAQUE IOHEXOL 350 MG/ML SOLN  Comparison: None.  Findings:  THORACIC INLET/BODY WALL:  11 mm nodule right lobe thyroid gland, without  neighboring adenopathy infiltrative margin.  MEDIASTINUM:  Normal heart size.  No pericardial effusion. Despite non gated technique, there is  aortic valve thickening with coarse dystrophic calcifications.  The number of cusps is indeterminate. 3.7 cm diameter ascending aorta.  No acute vascular abnormality. Proximal LAD coronary artery atherosclerotic calcification. The left vertebral artery is seen.  There is calcification along the expected origin, possibly representing previous occlusion.  The visible right vertebral artery is generous in size and shows no evidence of obstruction.  Mildly prominent lymph nodes in the upper right paratracheal station, nonspecific.  No cause identified.  LUNG WINDOWS:  No consolidation.  No effusion.  No suspicious pulmonary nodule. Borderline bronchial wall thickening diffusely.  UPPER ABDOMEN:  No acute findings.  OSSEOUS:  No acute fracture.  No suspicious lytic or blastic lesions. Spondylosis throughout the thoracic spine. Nodal sternoclavicular joint osteoarthritis.  IMPRESSION:  1.  No aortic aneurysm or acute arterial abnormality. 2.  Marked aortic valve thickening and calcification.  Recommend echocardiographic correlation.  3. LAD atherosclerotic calcification. 4. Eleven mm right thyroid nodule.   Original Report Authenticated By: Jorje Guild    Impression:  This 60 year old woman has stage D severe, symptomatic aortic stenosis with an early positive exercise treadmill test. I have personally reviewed and interpreted her recent echocardiogram and her CTA from 2014. She has a bicuspid aortic valve with calcified leaflets with decreased mobility and separation and a mean gradient that has increased to 52 mm Hg. I think aortic valve replacement is indicated for this patient.  Her CTA in 2014 showed mild enlargement of the ascending aorta to 3.8 x 3.7 cm with a descending aorta of 2.2 cm. Since she has a  bicuspid aortic valve I think this study should be repeat preop to see if there has been any further enlargement of her aorta over the past 3 years. She will require a cardiac cath preop.  The patient and her husband were counseled at length regarding treatment alternatives for management of severe symptomatic aortic stenosis. Alternative approaches such as conventional aortic valve replacement, transcatheter aortic valve replacement, and palliative medical therapy were compared and contrasted at length. I don't think she is a candidate for TAVR at 60 years old with low STS risk. The risks associated with conventional surgical aortic valve replacement were been discussed in detail, as were expectations for post-operative convalescence. Long-term prognosis with medical therapy was discussed.  We discussed the alternatives for valve replacement including mechanical and tissue valves, the need for life-long anticoagulation with a mechanical valve but excellent long-term durability, and the risk of structural valve deterioration with a tissue valve in her age group. I think a mechanical valve is probably the best option for her with no contraindication to anticoagulation. I answered all of their questions and she is going to think about it.    Plan:  She will have a cardiac cath scheduled through Dr. Myles Gip office. We will arrange for a CTA of the chest to assess the ascending aorta. I will see her back after that to discuss the results and make surgical plans.   I spent 60 minutes performing this consultation and > 50% of this time was spent face to face counseling and coordinating the care of this patient's severe aortic stenosis. Gaye Pollack, MD [facility] [phone]

## 2015-12-09 NOTE — Progress Notes (Signed)
This note serves to update patient's evaluation following office visit on August 23. She underwent an echocardiogram revealing LVEF 65-70% with mild diastolic dysfunction and bicuspid aortic valve with severe aortic stenosis. Mean gradient was 52 mmHg. Since she was not reporting any clear decline in functional capacity, we elected to pursue a standard GXT to better objectively evaluate her functional capacity. This test was performed on September 28 and was early positive with abnormal ST segment changes in late stage I, hypertensive blood pressure response, and exertional fatigue. Overall moderate risk Duke treadmill score of -6. I then referred her for TCTS evaluation, she saw Dr. Cyndia Bent just recently on 10/4 and is being further evaluated for conventional AVR. Left heart catheterization and coronary angiography are being scheduled now as an outpatient as part of this workup.  Satira Sark, M.D., F.A.C.C.

## 2015-12-11 NOTE — Progress Notes (Signed)
Update noted.

## 2015-12-14 ENCOUNTER — Other Ambulatory Visit (HOSPITAL_COMMUNITY)
Admission: RE | Admit: 2015-12-14 | Discharge: 2015-12-14 | Disposition: A | Discharge status: Home / Self Care | Payer: BC Managed Care – PPO | Source: Ambulatory Visit | Attending: Cardiology | Admitting: Cardiology

## 2015-12-14 ENCOUNTER — Ambulatory Visit (HOSPITAL_COMMUNITY)
Admission: RE | Admit: 2015-12-14 | Discharge: 2015-12-14 | Disposition: A | Discharge status: Home / Self Care | Payer: BC Managed Care – PPO | Source: Ambulatory Visit | Attending: Cardiology | Admitting: Cardiology

## 2015-12-14 DIAGNOSIS — I350 Nonrheumatic aortic (valve) stenosis: Secondary | ICD-10-CM | POA: Diagnosis not present

## 2015-12-14 DIAGNOSIS — Z01818 Encounter for other preprocedural examination: Secondary | ICD-10-CM | POA: Diagnosis present

## 2015-12-14 DIAGNOSIS — I5030 Unspecified diastolic (congestive) heart failure: Secondary | ICD-10-CM | POA: Insufficient documentation

## 2015-12-14 LAB — CBC WITH DIFFERENTIAL/PLATELET
BASOS ABS: 0 10*3/uL (ref 0.0–0.1)
BASOS PCT: 1 %
EOS ABS: 0.5 10*3/uL (ref 0.0–0.7)
Eosinophils Relative: 5 %
HCT: 37.7 % (ref 36.0–46.0)
HEMOGLOBIN: 12.7 g/dL (ref 12.0–15.0)
Lymphocytes Relative: 30 %
Lymphs Abs: 2.6 10*3/uL (ref 0.7–4.0)
MCH: 28.6 pg (ref 26.0–34.0)
MCHC: 33.7 g/dL (ref 30.0–36.0)
MCV: 84.9 fL (ref 78.0–100.0)
MONO ABS: 0.9 10*3/uL (ref 0.1–1.0)
MONOS PCT: 11 %
NEUTROS PCT: 53 %
Neutro Abs: 4.5 10*3/uL (ref 1.7–7.7)
Platelets: 281 10*3/uL (ref 150–400)
RBC: 4.44 MIL/uL (ref 3.87–5.11)
RDW: 13 % (ref 11.5–15.5)
WBC: 8.6 10*3/uL (ref 4.0–10.5)

## 2015-12-16 ENCOUNTER — Ambulatory Visit (HOSPITAL_COMMUNITY)
Admission: RE | Admit: 2015-12-16 | Discharge: 2015-12-16 | Disposition: A | Discharge status: Home / Self Care | Payer: BC Managed Care – PPO | Source: Ambulatory Visit | Attending: Interventional Cardiology | Admitting: Interventional Cardiology

## 2015-12-16 ENCOUNTER — Encounter (HOSPITAL_COMMUNITY)
Admission: RE | Disposition: A | Discharge status: Home / Self Care | Payer: Self-pay | Source: Ambulatory Visit | Attending: Interventional Cardiology

## 2015-12-16 ENCOUNTER — Encounter (HOSPITAL_COMMUNITY): Payer: Self-pay | Admitting: Interventional Cardiology

## 2015-12-16 ENCOUNTER — Other Ambulatory Visit: Payer: Self-pay

## 2015-12-16 DIAGNOSIS — E119 Type 2 diabetes mellitus without complications: Secondary | ICD-10-CM

## 2015-12-16 DIAGNOSIS — G2581 Restless legs syndrome: Secondary | ICD-10-CM | POA: Diagnosis not present

## 2015-12-16 DIAGNOSIS — I272 Pulmonary hypertension, unspecified: Secondary | ICD-10-CM | POA: Diagnosis not present

## 2015-12-16 DIAGNOSIS — I2510 Atherosclerotic heart disease of native coronary artery without angina pectoris: Secondary | ICD-10-CM | POA: Diagnosis not present

## 2015-12-16 DIAGNOSIS — N189 Chronic kidney disease, unspecified: Secondary | ICD-10-CM | POA: Insufficient documentation

## 2015-12-16 DIAGNOSIS — Z8249 Family history of ischemic heart disease and other diseases of the circulatory system: Secondary | ICD-10-CM | POA: Insufficient documentation

## 2015-12-16 DIAGNOSIS — E041 Nontoxic single thyroid nodule: Secondary | ICD-10-CM | POA: Insufficient documentation

## 2015-12-16 DIAGNOSIS — Z833 Family history of diabetes mellitus: Secondary | ICD-10-CM | POA: Insufficient documentation

## 2015-12-16 DIAGNOSIS — I350 Nonrheumatic aortic (valve) stenosis: Secondary | ICD-10-CM

## 2015-12-16 DIAGNOSIS — Z7982 Long term (current) use of aspirin: Secondary | ICD-10-CM | POA: Diagnosis not present

## 2015-12-16 DIAGNOSIS — E785 Hyperlipidemia, unspecified: Secondary | ICD-10-CM | POA: Diagnosis present

## 2015-12-16 DIAGNOSIS — Z794 Long term (current) use of insulin: Secondary | ICD-10-CM | POA: Insufficient documentation

## 2015-12-16 DIAGNOSIS — R9439 Abnormal result of other cardiovascular function study: Secondary | ICD-10-CM | POA: Diagnosis present

## 2015-12-16 DIAGNOSIS — I082 Rheumatic disorders of both aortic and tricuspid valves: Secondary | ICD-10-CM | POA: Diagnosis not present

## 2015-12-16 DIAGNOSIS — E782 Mixed hyperlipidemia: Secondary | ICD-10-CM | POA: Insufficient documentation

## 2015-12-16 DIAGNOSIS — R9431 Abnormal electrocardiogram [ECG] [EKG]: Secondary | ICD-10-CM

## 2015-12-16 DIAGNOSIS — I129 Hypertensive chronic kidney disease with stage 1 through stage 4 chronic kidney disease, or unspecified chronic kidney disease: Secondary | ICD-10-CM | POA: Diagnosis not present

## 2015-12-16 DIAGNOSIS — E1122 Type 2 diabetes mellitus with diabetic chronic kidney disease: Secondary | ICD-10-CM | POA: Insufficient documentation

## 2015-12-16 DIAGNOSIS — I10 Essential (primary) hypertension: Secondary | ICD-10-CM | POA: Diagnosis present

## 2015-12-16 HISTORY — PX: CARDIAC CATHETERIZATION: SHX172

## 2015-12-16 LAB — BASIC METABOLIC PANEL
Anion gap: 9 (ref 5–15)
BUN: 23 mg/dL — ABNORMAL HIGH (ref 6–20)
CHLORIDE: 105 mmol/L (ref 101–111)
CO2: 24 mmol/L (ref 22–32)
Calcium: 9.4 mg/dL (ref 8.9–10.3)
Creatinine, Ser: 1.44 mg/dL — ABNORMAL HIGH (ref 0.44–1.00)
GFR calc non Af Amer: 38 mL/min — ABNORMAL LOW (ref >60)
GFR, EST AFRICAN AMERICAN: 44 mL/min — AB (ref >60)
Glucose, Bld: 184 mg/dL — ABNORMAL HIGH (ref 65–99)
POTASSIUM: 4.2 mmol/L (ref 3.5–5.1)
SODIUM: 138 mmol/L (ref 135–145)

## 2015-12-16 LAB — POCT I-STAT 3, VENOUS BLOOD GAS (G3P V)
Acid-base deficit: 4 mmol/L — ABNORMAL HIGH (ref 0.0–2.0)
Bicarbonate: 21.8 mmol/L (ref 20.0–28.0)
O2 Saturation: 69 %
PCO2 VEN: 43.4 mmHg — AB (ref 44.0–60.0)
PH VEN: 7.31 (ref 7.250–7.430)
TCO2: 23 mmol/L (ref 0–100)
pO2, Ven: 39 mmHg (ref 32.0–45.0)

## 2015-12-16 LAB — POCT I-STAT 3, ART BLOOD GAS (G3+)
Acid-base deficit: 4 mmol/L — ABNORMAL HIGH (ref 0.0–2.0)
BICARBONATE: 21.7 mmol/L (ref 20.0–28.0)
O2 Saturation: 99 %
PCO2 ART: 40.5 mmHg (ref 32.0–48.0)
PH ART: 7.337 — AB (ref 7.350–7.450)
PO2 ART: 130 mmHg — AB (ref 83.0–108.0)
TCO2: 23 mmol/L (ref 0–100)

## 2015-12-16 LAB — GLUCOSE, CAPILLARY
GLUCOSE-CAPILLARY: 166 mg/dL — AB (ref 65–99)
GLUCOSE-CAPILLARY: 215 mg/dL — AB (ref 65–99)

## 2015-12-16 LAB — PROTIME-INR
INR: 0.86
PROTHROMBIN TIME: 11.7 s (ref 11.4–15.2)

## 2015-12-16 LAB — POCT ACTIVATED CLOTTING TIME: ACTIVATED CLOTTING TIME: 131 s

## 2015-12-16 SURGERY — RIGHT/LEFT HEART CATH AND CORONARY ANGIOGRAPHY

## 2015-12-16 MED ORDER — HEPARIN (PORCINE) IN NACL 2-0.9 UNIT/ML-% IJ SOLN
INTRAMUSCULAR | Status: DC | PRN
  Administered 2015-12-16: 500 mL

## 2015-12-16 MED ORDER — SODIUM CHLORIDE 0.9 % IV SOLN: 250.0000 mL | INTRAVENOUS | Status: DC | PRN

## 2015-12-16 MED ORDER — HEPARIN SODIUM (PORCINE) 1000 UNIT/ML IJ SOLN
INTRAMUSCULAR | Status: AC
  Filled 2015-12-16: qty 1

## 2015-12-16 MED ORDER — IOPAMIDOL (ISOVUE-370) INJECTION 76%
INTRAVENOUS | Status: DC | PRN
  Administered 2015-12-16: 50 mL via INTRA_ARTERIAL

## 2015-12-16 MED ORDER — HEPARIN (PORCINE) IN NACL 2-0.9 UNIT/ML-% IJ SOLN
INTRAMUSCULAR | Status: AC
  Filled 2015-12-16: qty 500

## 2015-12-16 MED ORDER — LIDOCAINE HCL (PF) 1 % IJ SOLN
INTRAMUSCULAR | Status: AC
  Filled 2015-12-16: qty 30

## 2015-12-16 MED ORDER — SODIUM CHLORIDE 0.9 % WEIGHT BASED INFUSION: 3.0000 mL/kg/h | INTRAVENOUS | Status: AC

## 2015-12-16 MED ORDER — SODIUM CHLORIDE 0.9% FLUSH: 3.0000 mL | Freq: Two times a day (BID) | INTRAVENOUS | Status: DC

## 2015-12-16 MED ORDER — FENTANYL CITRATE (PF) 100 MCG/2ML IJ SOLN
INTRAMUSCULAR | Status: AC
  Filled 2015-12-16: qty 2

## 2015-12-16 MED ORDER — ACETAMINOPHEN 325 MG PO TABS: 650.0000 mg | ORAL_TABLET | ORAL | Status: DC | PRN

## 2015-12-16 MED ORDER — IOPAMIDOL (ISOVUE-370) INJECTION 76%
INTRAVENOUS | Status: AC
Start: 2015-12-16 — End: 2015-12-16
  Filled 2015-12-16: qty 100

## 2015-12-16 MED ORDER — MIDAZOLAM HCL 2 MG/2ML IJ SOLN
INTRAMUSCULAR | Status: DC | PRN
  Administered 2015-12-16 (×2): 1 mg via INTRAVENOUS

## 2015-12-16 MED ORDER — SODIUM CHLORIDE 0.9% FLUSH: 3.0000 mL | INTRAVENOUS | Status: DC | PRN

## 2015-12-16 MED ORDER — AMLODIPINE BESYLATE 2.5 MG PO TABS
2.5000 mg | ORAL_TABLET | ORAL | Status: AC
  Administered 2015-12-16: 2.5 mg via ORAL
  Filled 2015-12-16 (×2): qty 1

## 2015-12-16 MED ORDER — SODIUM CHLORIDE 0.9 % IV SOLN
INTRAVENOUS | Status: DC
  Administered 2015-12-16: 07:00:00 via INTRAVENOUS

## 2015-12-16 MED ORDER — OXYCODONE-ACETAMINOPHEN 5-325 MG PO TABS: 1.0000 | ORAL_TABLET | ORAL | Status: DC | PRN

## 2015-12-16 MED ORDER — SODIUM CHLORIDE 0.9 % IV SOLN: INTRAVENOUS | Status: DC

## 2015-12-16 MED ORDER — FENTANYL CITRATE (PF) 100 MCG/2ML IJ SOLN
INTRAMUSCULAR | Status: DC | PRN
  Administered 2015-12-16: 50 ug via INTRAVENOUS

## 2015-12-16 MED ORDER — HEPARIN SODIUM (PORCINE) 1000 UNIT/ML IJ SOLN
INTRAMUSCULAR | Status: DC | PRN
  Administered 2015-12-16: 2000 [IU] via INTRAVENOUS

## 2015-12-16 MED ORDER — MIDAZOLAM HCL 2 MG/2ML IJ SOLN
INTRAMUSCULAR | Status: AC
  Filled 2015-12-16: qty 2

## 2015-12-16 MED ORDER — ONDANSETRON HCL 4 MG/2ML IJ SOLN: 4.0000 mg | Freq: Four times a day (QID) | INTRAMUSCULAR | Status: DC | PRN

## 2015-12-16 MED ORDER — ASPIRIN 81 MG PO CHEW: 81.0000 mg | CHEWABLE_TABLET | ORAL | Status: DC

## 2015-12-16 SURGICAL SUPPLY — 11 items
CATH INFINITI JR4 5F (CATHETERS) ×2 IMPLANT
CATH SITESEER 5F MULTI A 2 (CATHETERS) ×2 IMPLANT
CATH SWAN GANZ 7F STRAIGHT (CATHETERS) ×2 IMPLANT
KIT HEART LEFT (KITS) ×3 IMPLANT
KIT HEART RIGHT NAMIC (KITS) ×3 IMPLANT
PACK CARDIAC CATHETERIZATION (CUSTOM PROCEDURE TRAY) ×3 IMPLANT
SHEATH PINNACLE 5F 10CM (SHEATH) ×2 IMPLANT
SHEATH PINNACLE 7F 10CM (SHEATH) ×2 IMPLANT
TRANSDUCER W/STOPCOCK (MISCELLANEOUS) ×4 IMPLANT
WIRE EMERALD 3MM-J .035X150CM (WIRE) ×2 IMPLANT
WIRE EMERALD ST .035X150CM (WIRE) ×2 IMPLANT

## 2015-12-16 NOTE — H&P (View-Only) (Signed)
Cardiothoracic Surgery Consultation  PCP is Sallee Lange, MD Referring Provider is Satira Sark, MD  Chief Complaint  Patient presents with  . Aortic Stenosis    ECHO 11/09/15. EXERCISE TOL. TEST 12/01/15    HPI:  The patient is a 60 year old woman with hypertension, hyperlipidemia, and type 2 DM who has a history of severe aortic stenosis. She had an echo in 09/2012 that showed a mean AV gradient of 48 mm Hg with a thickened and calcified aortic valve. By report this was a significant increase in gradient from her prior echo in 2012. Her most recent echo on 11/09/2015 was a better study and showed a bicuspid aortic valve with moderately calcified leaflets and reduced leaflet separation with an increase in the mean gradient to 52 mm Hg with a peak of 88 mm Hg. LVEF was 65-70% with grade 1 diastolic dysfunction and moderate LVH. There was no significant MR. The aortic root was normal in size. She had a CTA of the chest on 09/12/2012 that showed a mildly enlarged ascending aorta. The aortic valve was calcified and thickened. There was atherosclerotic calcification in the LAD.   She reports feeling well overall. She does admit to some shortness of breath with heavy exertion or going up steps. 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 She denies chest pain or dizziness.  Since she felt well she underwent an ETT on 12/01/2015 which was early positive with abnormal ST depression inferiorly and laterally in late stage 1 with impaired exercise capacity. The test was stopped due to fatigue and shortness of breath.  Past Medical History:  Diagnosis Date  . Aortic stenosis    Mild 5/12  . Essential hypertension, benign   . Mixed hyperlipidemia   . Restless leg syndrome   . Ruptured disk   . Type 2 diabetes mellitus ([place])     Past Surgical History:  Procedure Laterality Date  . COLONOSCOPY      Family History    Problem Relation Age of Onset  . Coronary artery disease Father     Diagnosed in his 39s  . Hypertension Father   . Diabetes type II Mother   . Hypertension Mother   . Diabetes Mother   . Diabetes type II Brother   . Diabetes Brother   . Hypertension Sister   . Hyperlipidemia Sister     Social History Social History  Substance Use Topics  . Smoking status: Never Smoker  . Smokeless tobacco: Never Used  . Alcohol use No    Current Outpatient Prescriptions  Medication Sig Dispense Refill  . ACCU-CHEK AVIVA PLUS test strip TEST DAILY AS DIRECTED. 50 each 5  . ACCU-CHEK FASTCLIX LANCETS MISC USE DAILY AS DIRECTED. 102 each 5  . amLODipine (NORVASC) 2.5 MG tablet Take 1 tablet (2.5 mg total) by mouth daily. 30 tablet 5  . aspirin 81 MG tablet Take 81 mg by mouth daily.    . B-D UF III MINI PEN NEEDLES 31G X 5 MM MISC USE AS DIRECTED. 100 each 5  . empagliflozin (JARDIANCE) 10 MG TABS tablet Take 10 mg by mouth daily. 30 tablet 5  . glyBURIDE (DIABETA) 5 MG tablet TAKE 1 tablet by mouth every morning and 1 tablet with supper. 60 tablet 5  . insulin detemir (LEVEMIR) 100 UNIT/ML injection Inject 0.24 mLs (24 Units total) into the skin  at bedtime. 10 mL 5  . lisinopril (PRINIVIL,ZESTRIL) 20 MG tablet Take 1 tablet (20 mg total) by mouth daily. 30 tablet 5  . metFORMIN (GLUCOPHAGE) 1000 MG tablet TAKE (1) TABLET BY MOUTH TWICE DAILY. 60 tablet 5  . pravastatin (PRAVACHOL) 20 MG tablet TAKE 1 TABLET BY MOUTH AT BEDTIME FOR CHOLESTEROL. 30 tablet 5  . traZODone (DESYREL) 100 MG tablet Take 1/2 to 1 tablet as needed for bedtime. 30 tablet 5   No current facility-administered medications for this visit.     No Known Allergies  Review of Systems  Constitutional: Positive for fatigue. Negative for activity change, appetite change and unexpected weight change.  HENT:       Has not seen her dentist in several years and thinks she may have a problem with a filling.  Eyes: Negative.    Respiratory: Positive for shortness of breath. Negative for chest tightness.        With exertion  Cardiovascular: Negative for chest pain, palpitations and leg swelling.  Gastrointestinal: Negative.   Endocrine: Negative.   Genitourinary: Negative.   Musculoskeletal: Negative.   Allergic/Immunologic: Negative.   Neurological: Negative for dizziness, syncope and light-headedness.  Hematological: Negative.   Psychiatric/Behavioral: Negative.     BP (!) 170/84   Pulse 94   Resp 16   Ht 5\' 6"  (1.676 m)   Wt 232 lb (105.2 kg)   SpO2 98% Comment: ON RA  BMI 37.45 kg/m  Physical Exam  Constitutional: She is oriented to person, place, and time.  Obese woman in no distress  BSA 2.24 m2  HENT:  Head: Normocephalic and atraumatic.  Mouth/Throat: Oropharynx is clear and moist.  Teeth look like they are in grossly fair condition  Eyes: EOM are normal. Pupils are equal, round, and reactive to light.  Neck: Normal range of motion. Neck supple. No JVD present. Thyromegaly present.  Cardiovascular: Normal rate, regular rhythm and intact distal pulses.   Murmur heard. 3/6 systolic murmur RSB  Pulmonary/Chest: Effort normal and breath sounds normal. No respiratory distress.  Abdominal: Soft. Bowel sounds are normal. She exhibits no distension and no mass. There is no tenderness.  Musculoskeletal: Normal range of motion. She exhibits no edema.  Lymphadenopathy:    She has no cervical adenopathy.  Neurological: She is alert and oriented to person, place, and time. She has normal strength. No cranial nerve deficit or sensory deficit.  Skin: Skin is warm and dry.  Psychiatric: She has a normal mood and affect.    Diagnostic Tests:     *[place], [place] [address]                            [id number]  ------------------------------------------------------------------- Transthoracic  Echocardiography  Patient:    [name], [name] MR #:       [id number] Study Date: 11/09/2015 Gender:     F Age:        34 Height:     167.6 cm Weight:     103.4 kg BSA:        2.24 m^2 Pt. Status: Room:   ATTENDING    Rozann Lesches,  M.D.  ORDERING     Rozann Lesches, M.D.  REFERRING    Rozann Lesches, M.D.  PERFORMING   [facility], [facility]  SONOGRAPHER  Alvino Chapel, RCS  cc:  ------------------------------------------------------------------- LV EF: 65% -   70%  ------------------------------------------------------------------- Indications:      Aortic stenosis 424.1.  ------------------------------------------------------------------- History:   Risk factors:  Hypertension. Diabetes mellitus. Dyslipidemia.  ------------------------------------------------------------------- Study Conclusions  - Left ventricle: The cavity size was normal. Wall thickness was   increased in a pattern of moderate LVH. Systolic function was   vigorous. The estimated ejection fraction was in the range of 65%   to 70%. Wall motion was normal; there were no regional wall   motion abnormalities. Doppler parameters are consistent with   abnormal left ventricular relaxation (grade 1 diastolic   dysfunction). - Aortic valve: Bicuspid; moderately calcified leaflets. Cusp   separation was reduced. There was severe stenosis. Mean gradient   (S): 52 mm Hg. Peak gradient (S): 88 mm Hg. VTI ratio of LVOT to   aortic valve: 0.26. Valve area (VTI): 0.9 cm^2. - Left atrium: The atrium was at the upper limits of normal in   size. - Right atrium: Central venous pressure (est): 3 mm Hg. - Atrial septum: No defect or patent foramen ovale was identified. - Tricuspid valve: There was trivial regurgitation. - Pulmonary arteries: PA peak pressure: 27 mm Hg (S). - Pericardium, extracardiac: A prominent pericardial fat pad was   present.  Impressions:  - Moderate LVH with LVEF 65-70%. Grade 1  diastolic dysfunction.   Upper normal left atrial chamber size. Moderately calcified,   bicuspid aortic valve with evidence of severe stenosis as   outlined above. Trivial tricuspid regurgitation with PASP 27   mmHg.  ------------------------------------------------------------------- Study data:  Comparison was made to the study of 09/14/2012.  Study status:  Routine.  Procedure:  The patient reported no pain pre or post test. Transthoracic echocardiography. Image quality was adequate.  Study completion:  There were no complications. Transthoracic echocardiography.  M-mode, complete 2D, spectral Doppler, and color Doppler.  Birthdate:  Patient birthdate: December 27, 1955.  Age:  Patient is 60 yr old.  Sex:  Gender: female. BMI: 36.8 kg/m^2.  Blood pressure:     189/96  Patient status: Inpatient.  Study date:  Study date: 11/09/2015. Study time: 01:08 PM.  Location:  Echo laboratory.  -------------------------------------------------------------------  ------------------------------------------------------------------- Left ventricle:  The cavity size was normal. Wall thickness was increased in a pattern of moderate LVH. Systolic function was vigorous. The estimated ejection fraction was in the range of 65% to 70%. Wall motion was normal; there were no regional wall motion abnormalities. Doppler parameters are consistent with abnormal left ventricular relaxation (grade 1 diastolic dysfunction).  ------------------------------------------------------------------- Aortic valve:   Bicuspid; moderately calcified leaflets. Cusp separation was reduced.  Doppler:   There was severe stenosis. There was no significant regurgitation.    VTI ratio of LVOT to aortic valve: 0.26. Valve area (VTI): 0.9 cm^2. Indexed valve area (VTI): 0.4 cm^2/m^2. Peak velocity ratio of LVOT to aortic valve: 0.21. Valve area (Vmax): 0.74 cm^2. Indexed valve area (Vmax): 0.33 cm^2/m^2. Mean velocity ratio of LVOT  to aortic valve: 0.21. Valve area (Vmean): 0.74 cm^2. Indexed valve area (Vmean): 0.33 cm^2/m^2.    Mean gradient (S): 52 mm Hg. Peak gradient (S): 88 mm Hg.  ------------------------------------------------------------------- Aorta:  Aortic root: The aortic root was normal in size.  ------------------------------------------------------------------- Mitral valve:   The valve appears to be grossly normal.  Doppler:  There was no significant regurgitation.  ------------------------------------------------------------------- Left atrium:  The atrium was at the upper limits of normal in size.   ------------------------------------------------------------------- Atrial septum:  No defect or patent foramen ovale was identified.   ------------------------------------------------------------------- Right ventricle:  The cavity size was normal. Systolic function was normal.  ------------------------------------------------------------------- Pulmonic valve:    The valve appears to be grossly normal. Doppler:  There was no significant regurgitation.  ------------------------------------------------------------------- Tricuspid valve:   The valve appears to be grossly normal. Doppler:  There was trivial regurgitation.  ------------------------------------------------------------------- Right atrium:  The atrium was normal in size.  ------------------------------------------------------------------- Pericardium:  A prominent pericardial fat pad was present. There was no pericardial effusion.  ------------------------------------------------------------------- Systemic veins: Inferior vena cava: The vessel was normal in size. The respirophasic diameter changes were in the normal range (>= 50%), consistent with normal central venous pressure.  ------------------------------------------------------------------- Measurements   Left ventricle                            Value           Reference  LV ID, ED, PLAX chordal           (L)     40.2  mm       43 - 52  LV ID, ES, PLAX chordal                   23.5  mm       23 - 38  LV fx shortening, PLAX chordal            42    %        >=29  LV PW thickness, ED                       13.4  mm       ---------  IVS/LV PW ratio, ED                       1.2            <=1.3  Stroke volume, 2D                         97    ml       ---------  Stroke volume/bsa, 2D                     43    ml/m^2   ---------  LV ejection fraction, 1-p A4C             59    %        ---------  LV end-diastolic volume, 2-p              65    ml       ---------  LV end-systolic volume, 2-p               26    ml       ---------  LV ejection fraction, 2-p                 60    %        ---------  Stroke volume, 2-p                        39  ml       ---------  LV end-diastolic volume/bsa, 2-p          29    ml/m^2   ---------  LV end-systolic volume/bsa, 2-p           11    ml/m^2   ---------  Stroke volume/bsa, 2-p                    17.3  ml/m^2   ---------  LV e&', lateral                            8.27  cm/s     ---------  LV E/e&', lateral                          8.04           ---------  LV e&', medial                             5.66  cm/s     ---------  LV E/e&', medial                           11.75          ---------  LV e&', average                            6.97  cm/s     ---------  LV E/e&', average                          9.55           ---------    Ventricular septum                        Value          Reference  IVS thickness, ED                         16.1  mm       ---------    LVOT                                      Value          Reference  LVOT ID, S                                21    mm       ---------  LVOT area                                 3.46  cm^2     ---------  LVOT peak velocity, S                     101   cm/s     ---------  LVOT mean velocity, S  69.9  cm/s      ---------  LVOT VTI, S                               28    cm       ---------  LVOT peak gradient, S                     4     mm Hg    ---------    Aortic valve                              Value          Reference  Aortic valve peak velocity, S             470   cm/s     ---------  Aortic valve mean velocity, S             327   cm/s     ---------  Aortic valve VTI, S                       108   cm       ---------  Aortic mean gradient, S                   52    mm Hg    ---------  Aortic peak gradient, S                   88    mm Hg    ---------  VTI ratio, LVOT/AV                        0.26           ---------  Aortic valve area, VTI                    0.9   cm^2     ---------  Aortic valve area/bsa, VTI                0.4   cm^2/m^2 ---------  Velocity ratio, peak, LVOT/AV             0.21           ---------  Aortic valve area, peak velocity          0.74  cm^2     ---------  Aortic valve area/bsa, peak               0.33  cm^2/m^2 ---------  velocity  Velocity ratio, mean, LVOT/AV             0.21           ---------  Aortic valve area, mean velocity          0.74  cm^2     ---------  Aortic valve area/bsa, mean               0.33  cm^2/m^2 ---------  velocity    Aorta                                     Value          Reference  Aortic root ID,  ED                        31    mm       ---------    Left atrium                               Value          Reference  LA ID, A-P, ES                            39    mm       ---------  LA ID/bsa, A-P                            1.74  cm/m^2   <=2.2  LA volume, S                              60.9  ml       ---------  LA volume/bsa, S                          27.2  ml/m^2   ---------  LA volume, ES, 1-p A4C                    57.6  ml       ---------  LA volume/bsa, ES, 1-p A4C                25.7  ml/m^2   ---------  LA volume, ES, 1-p A2C                    57.2  ml       ---------  LA volume/bsa, ES, 1-p A2C                25.5   ml/m^2   ---------    Mitral valve                              Value          Reference  Mitral E-wave peak velocity               66.5  cm/s     ---------  Mitral A-wave peak velocity               111   cm/s     ---------  Mitral deceleration time          (H)     563   ms       150 - 230  Mitral E/A ratio, peak                    0.6            ---------    Pulmonary arteries                        Value          Reference  PA pressure, S, DP  27    mm Hg    <=30    Tricuspid valve                           Value          Reference  Tricuspid regurg peak velocity            244   cm/s     ---------  Tricuspid peak RV-RA gradient             24    mm Hg    ---------    Systemic veins                            Value          Reference  Estimated CVP                             3     mm Hg    ---------    Right ventricle                           Value          Reference  RV ID, ED, PLAX                           27.8  mm       19 - 38  TAPSE                                     27.7  mm       ---------  RV pressure, S, DP                        27    mm Hg    <=30  RV s&', lateral, S                         13.1  cm/s     ---------  Legend: (L)  and  (H)  mark values outside specified reference range.  ------------------------------------------------------------------- Prepared and Electronically Authenticated by  Rozann Lesches, M.D. 2017-09-06T15:18:28   *RADIOLOGY REPORT*  Clinical Data: Rule out aortic aneurysm.  CT ANGIOGRAPHY CHEST  Technique:  Multidetector CT imaging of the chest using the standard protocol during bolus administration of intravenous contrast. Multiplanar reconstructed images including MIPs were obtained and reviewed to evaluate the vascular anatomy.  Contrast: 163mL OMNIPAQUE IOHEXOL 350 MG/ML SOLN  Comparison: None.  Findings:  THORACIC INLET/BODY WALL:  11 mm nodule right lobe thyroid gland, without  neighboring adenopathy infiltrative margin.  MEDIASTINUM:  Normal heart size.  No pericardial effusion. Despite non gated technique, there is  aortic valve thickening with coarse dystrophic calcifications.  The number of cusps is indeterminate. 3.7 cm diameter ascending aorta.  No acute vascular abnormality. Proximal LAD coronary artery atherosclerotic calcification. The left vertebral artery is seen.  There is calcification along the expected origin, possibly representing previous occlusion.  The visible right vertebral artery is generous in size and shows no evidence of obstruction.  Mildly prominent lymph nodes in the upper right paratracheal station, nonspecific.  No cause identified.  LUNG WINDOWS:  No consolidation.  No effusion.  No suspicious pulmonary nodule. Borderline bronchial wall thickening diffusely.  UPPER ABDOMEN:  No acute findings.  OSSEOUS:  No acute fracture.  No suspicious lytic or blastic lesions. Spondylosis throughout the thoracic spine. Nodal sternoclavicular joint osteoarthritis.  IMPRESSION:  1.  No aortic aneurysm or acute arterial abnormality. 2.  Marked aortic valve thickening and calcification.  Recommend echocardiographic correlation.  3. LAD atherosclerotic calcification. 4. Eleven mm right thyroid nodule.   Original Report Authenticated By: Jorje Guild    Impression:  This 60 year old woman has stage D severe, symptomatic aortic stenosis with an early positive exercise treadmill test. I have personally reviewed and interpreted her recent echocardiogram and her CTA from 2014. She has a bicuspid aortic valve with calcified leaflets with decreased mobility and separation and a mean gradient that has increased to 52 mm Hg. I think aortic valve replacement is indicated for this patient.  Her CTA in 2014 showed mild enlargement of the ascending aorta to 3.8 x 3.7 cm with a descending aorta of 2.2 cm. Since she has a  bicuspid aortic valve I think this study should be repeat preop to see if there has been any further enlargement of her aorta over the past 3 years. She will require a cardiac cath preop.  The patient and her husband were counseled at length regarding treatment alternatives for management of severe symptomatic aortic stenosis. Alternative approaches such as conventional aortic valve replacement, transcatheter aortic valve replacement, and palliative medical therapy were compared and contrasted at length. I don't think she is a candidate for TAVR at 60 years old with low STS risk. The risks associated with conventional surgical aortic valve replacement were been discussed in detail, as were expectations for post-operative convalescence. Long-term prognosis with medical therapy was discussed.  We discussed the alternatives for valve replacement including mechanical and tissue valves, the need for life-long anticoagulation with a mechanical valve but excellent long-term durability, and the risk of structural valve deterioration with a tissue valve in her age group. I think a mechanical valve is probably the best option for her with no contraindication to anticoagulation. I answered all of their questions and she is going to think about it.    Plan:  She will have a cardiac cath scheduled through Dr. Myles Gip office. We will arrange for a CTA of the chest to assess the ascending aorta. I will see her back after that to discuss the results and make surgical plans.   I spent 60 minutes performing this consultation and > 50% of this time was spent face to face counseling and coordinating the care of this patient's severe aortic stenosis. Gaye Pollack, MD [facility] [phone]

## 2015-12-16 NOTE — Progress Notes (Addendum)
Site area: RFA/RFV Site Prior to Removal:  Level 0 Pressure Applied For: 20 min Manual:  yes  Patient Status During Pull: stable  Post Pull Site:  Level 0 Post Pull Instructions Given: yes  Post Pull Pulses Present: palpable Dressing Applied: tegaderm  Bedrest begins @ 0920 till 1320 Comments:

## 2015-12-16 NOTE — Interval H&P Note (Signed)
History and Physical Interval Note:  12/16/2015 7:14 AM   The patient has bicuspid aortic valve with severe aortic stenosis documented by echocardiography. Mean gradient greater than 50 mmHg. She has been seen in consultation by Dr. Gilford Raid which was recommended by Dr. Rozann Lesches. She is to undergo right and left heart catheterization pre-SAVR. The major issue is whether there is significant coronary disease that will need to be managed at the time of aortic valve replacement.  The exam is consistent with aortic stenosis. Her blood pressure this morning is quite high.  Pretest, earlier this fall she underwent an exercise treadmill test that was early positive for ischemic EKG changes and significant blood pressure elevation.  [name]  has presented today for surgery, with the diagnosis of aortic valve surgical clearance  The various methods of treatment have been discussed with the patient and family. After consideration of risks, benefits and other options for treatment, the patient has consented to  Procedure(s): Left Heart Cath and Coronary Angiography (N/A) as a surgical intervention .  The patient's history has been reviewed, patient examined, no change in status, stable for surgery.  I have reviewed the patient's chart and labs.  Questions were answered to the patient's satisfaction.     [name]

## 2015-12-16 NOTE — Discharge Instructions (Signed)
°  NO METFORMIN/GLUCOPHAGE FOR 2 DAYS ° ° °Groin Surgical Site Care °Refer to this sheet in the next few weeks. These instructions provide you with information about caring for yourself after your procedure. Your health care provider may also give you more specific instructions. Your treatment has been planned according to current medical practices, but problems sometimes occur. Call your health care provider if you have any problems or questions after your procedure. °WHAT TO EXPECT AFTER THE PROCEDURE °After your procedure, it is typical to have the following: °· Bruising at the groin site that usually fades within 1-2 weeks. °· Blood collecting in the tissue (hematoma) that may be painful to the touch. It should usually decrease in size and tenderness within 1-2 weeks. °HOME CARE INSTRUCTIONS °· Take medicines only as directed by your health care provider. °· You may shower 24-48 hours after the procedure or as directed by your health care provider. Remove the bandage (dressing) and gently wash the site with plain soap and water. Pat the area dry with a clean towel. Do not rub the site, because this may cause bleeding. °· Do not take baths, swim, or use a hot tub until your health care provider approves. °· Check your insertion site every day for redness, swelling, or drainage. °· Do not apply powder or lotion to the site. °· Limit use of stairs to twice a day for the first 2-3 days or as directed by your health care provider. °· Do not squat for the first 2-3 days or as directed by your health care provider. °· Do not lift over 10 lb (4.5 kg) for 5 days after your procedure or as directed by your health care provider. °· Ask your health care provider when it is okay to: °¨ Return to work or school. °¨ Resume usual physical activities or sports. °¨ Resume sexual activity. °· Do not drive home if you are discharged the same day as the procedure. Have someone else drive you. °· You may drive 24 hours after the  procedure unless otherwise instructed by your health care provider. °· Do not operate machinery or power tools for 24 hours after the procedure or as directed by your health care provider. °· If your procedure was done as an outpatient procedure, which means that you went home the same day as your procedure, a responsible adult should be with you for the first 24 hours after you arrive home. °· Keep all follow-up visits as directed by your health care provider. This is important. °SEEK MEDICAL CARE IF: °· You have a fever. °· You have chills. °· You have increased bleeding from the groin site. Hold pressure on the site. °SEEK IMMEDIATE MEDICAL CARE IF: °· You have unusual pain at the groin site. °· You have redness, warmth, or swelling at the groin site. °· You have drainage (other than a small amount of blood on the dressing) from the groin site. °· The groin site is bleeding, and the bleeding does not stop after 30 minutes of holding steady pressure on the site. °· Your leg or foot becomes pale, cool, tingly, or numb. °  °This information is not intended to replace advice given to you by your health care provider. Make sure you discuss any questions you have with your health care provider. °  °Document Released: 10/23/2013 Document Reviewed: 10/23/2013 °Elsevier Interactive Patient Education ©2016 [employer] ° °

## 2015-12-21 ENCOUNTER — Other Ambulatory Visit: Payer: Self-pay | Admitting: Family Medicine

## 2015-12-21 DIAGNOSIS — Z1231 Encounter for screening mammogram for malignant neoplasm of breast: Secondary | ICD-10-CM

## 2015-12-22 ENCOUNTER — Other Ambulatory Visit: Payer: Self-pay | Admitting: *Deleted

## 2015-12-22 ENCOUNTER — Ambulatory Visit (INDEPENDENT_AMBULATORY_CARE_PROVIDER_SITE_OTHER): Payer: BC Managed Care – PPO | Admitting: Surgery

## 2015-12-22 ENCOUNTER — Encounter: Payer: Self-pay | Admitting: Surgery

## 2015-12-22 VITALS — BP 150/88 | HR 96 | Resp 20 | Ht 66.0 in | Wt 230.0 lb

## 2015-12-22 DIAGNOSIS — I350 Nonrheumatic aortic (valve) stenosis: Secondary | ICD-10-CM | POA: Diagnosis not present

## 2015-12-22 DIAGNOSIS — I712 Thoracic aortic aneurysm, without rupture: Secondary | ICD-10-CM

## 2015-12-22 DIAGNOSIS — I7121 Aneurysm of the ascending aorta, without rupture: Secondary | ICD-10-CM

## 2015-12-22 NOTE — Progress Notes (Signed)
HPI:  The patient returns today for review of her recent cardiac cath done in preparation for planned AVR for severe aortic stenosis. The cath shows mild irregularity in the proximal to mid LAD without significant obstruction. Right heart pressures were normal with a CI of 2.57. The mean AV gradient was 53.8 mm Hg. She has had no change in her symptoms since I saw her last.  Current Outpatient Prescriptions  Medication Sig Dispense Refill  . ACCU-CHEK AVIVA PLUS test strip TEST DAILY AS DIRECTED. 50 each 5  . ACCU-CHEK FASTCLIX LANCETS MISC USE DAILY AS DIRECTED. 102 each 5  . amLODipine (NORVASC) 2.5 MG tablet Take 1 tablet (2.5 mg total) by mouth daily. 30 tablet 5  . aspirin 81 MG tablet Take 81 mg by mouth daily.    . B-D UF III MINI PEN NEEDLES 31G X 5 MM MISC USE AS DIRECTED. 100 each 5  . empagliflozin (JARDIANCE) 10 MG TABS tablet Take 10 mg by mouth daily. 30 tablet 5  . glyBURIDE (DIABETA) 5 MG tablet TAKE 1 tablet by mouth every morning and 1 tablet with supper. 60 tablet 5  . insulin detemir (LEVEMIR) 100 UNIT/ML injection Inject 0.24 mLs (24 Units total) into the skin at bedtime. 10 mL 5  . lisinopril (PRINIVIL,ZESTRIL) 20 MG tablet Take 1 tablet (20 mg total) by mouth daily. 30 tablet 5  . metFORMIN (GLUCOPHAGE) 1000 MG tablet TAKE (1) TABLET BY MOUTH TWICE DAILY. 60 tablet 5  . pravastatin (PRAVACHOL) 20 MG tablet TAKE 1 TABLET BY MOUTH AT BEDTIME FOR CHOLESTEROL. 30 tablet 5  . traZODone (DESYREL) 100 MG tablet Take 1/2 to 1 tablet as needed for bedtime. 30 tablet 5   No current facility-administered medications for this visit.      Physical Exam: BP (!) 150/88 (BP Location: Right Arm, Patient Position: Sitting, Cuff Size: Large)   Pulse 96   Resp 20   Ht 5\' 6"  (1.676 m)   Wt 230 lb (104.3 kg)   SpO2 98% Comment: RA  BMI 37.12 kg/m  She looks well Cardiac exam shows a regular rate and rhythm with a 3/6 systolic murmur along the RSB. Lungs are clear There is  no peripheral edema  Diagnostic Tests:  CHRISTEAN REZEK  Cardiac catheterization  Order# [id number]  Reading physician: Belva Crome, MD Ordering physician: Belva Crome, MD Study date: 12/16/15  Physicians   Panel Physicians Referring Physician Case Authorizing Physician  Belva Crome, MD (Primary)    Procedures   Right/Left Heart Cath and Coronary Angiography  Conclusion     Prox LAD to Mid LAD lesion, 40 %stenosed.  Ost 1st Diag to 1st Diag lesion, 25 %stenosed.  There is severe aortic valve stenosis.  Hemodynamic findings consistent with pulmonary hypertension.    Severe aortic stenosis documented by hemodynamics. Mean gradient 53.8 mmHg and AVA .68 cm2.  Widely patent coronary arteries. 20-40% irregularities in the proximal to mid LAD.  Normal right heart pressures.  LV angiogram not performed due to CKD. Recent LVEF 60% by echocardiogram.  RECOMMENDATIONS:   SAVR per Dr. Cyndia Bent.   Indications   Abnormal ECG during exercise stress test [R94.31 (ICD-10-CM)]  Aortic stenosis, severe [I35.0 (ICD-10-CM)]  Procedural Details/Technique   Technical Details The right femoral area was sterilely prepped and draped. 1% Xylocaine local infiltration was then given for analgesia. Sedation was administered. A Seldinger needle was then used to perform an anterior wall femoral artery and a 5 [place]  arterial sheath was inserted using the Seldinger technique. The Seldinger needle was then used to perform an anterior wall stick on the right femoral vein. A 7 French sheath was inserted using the modified Seldinger technique. Right heart catheterization was then performed with a balloontipped catheter. A pulmonary artery saturation was obtained. Coronary angiography was then performed using a 5 French A2 MP. 2000 units of heparin was administered and we attempted to cross the aortic valve using the multipurpose catheter but were unsuccessful. An aortic arterial saturation was  obtained. This catheter was then removed and a JR4 5 [place] diagnostic catheter was used. We were able to successfully cross the valve and documented by pullback recording a significant gradient across the aortic valve. Left ventriculography was not performed.   Contrast exposure 50 cc.  During this procedure the patient is administered a total of Versed 2 mg and Fentanyl 50 mcg to achieve and maintain moderate conscious sedation. The patient's heart rate, blood pressure, and oxygen saturation are monitored continuously during the procedure. The period of conscious sedation is 34 minutes, of which I was present face-to-face 100% of this time.   Estimated blood loss <50 mL.  During this procedure the patient was administered the following to achieve and maintain moderate conscious sedation: Versed 3 mg, Fentanyl 50 mcg, while the patient's heart rate, blood pressure, and oxygen saturation were continuously monitored. The period of conscious sedation was 34 minutes, of which I was present face-to-face 100% of this time.    Coronary Findings   Dominance: Right  Left Anterior Descending  Prox LAD to Mid LAD lesion, 40% stenosed.  First Diagonal Branch  Ost 1st Diag to 1st Diag lesion, 25% stenosed.  Right Heart   Right Heart Pressures Hemodynamic findings consistent with pulmonary hypertension.    Wall Motion   Not done        Left Heart   Left Ventricle Not performed.    Aortic Valve There is severe aortic valve stenosis. The aortic valve is calcified. The aortic valve is congenitally bicuspid.    Coronary Diagrams   Diagnostic Diagram     Implants     No implant documentation for this case.  PACS Images   Show images for Cardiac catheterization   Link to Procedure Log   Procedure Log    Hemo Data   Flowsheet Row Most Recent Value  Fick Cardiac Output 5.44 L/min  Fick Cardiac Output Index 2.57 (L/min)/BSA  Aortic Mean Gradient 53.8 mmHg  Aortic Peak Gradient 79  mmHg  Aortic Valve Area 0.68  Aortic Value Area Index 0.32 cm2/BSA  RA A Wave 9 mmHg  RA V Wave 8 mmHg  RA Mean 7 mmHg  RV Systolic Pressure 29 mmHg  RV Diastolic Pressure 6 mmHg  RV EDP 9 mmHg  PA Systolic Pressure 32 mmHg  PA Diastolic Pressure 9 mmHg  PA Mean 21 mmHg  PW A Wave 14 mmHg  PW V Wave 12 mmHg  PW Mean 12 mmHg  AO Systolic Pressure [id number] mmHg  AO Diastolic Pressure 60 mmHg  AO Mean 89 mmHg  LV Systolic Pressure 123456 mmHg  LV Diastolic Pressure 5 mmHg  LV EDP 17 mmHg  Arterial Occlusion Pressure Extended Systolic Pressure [id number] mmHg  Arterial Occlusion Pressure Extended Diastolic Pressure 59 mmHg  Arterial Occlusion Pressure Extended Mean Pressure 84 mmHg  Left Ventricular Apex Extended Systolic Pressure 123456 mmHg  Left Ventricular Apex Extended Diastolic Pressure 11 mmHg  Left Ventricular Apex Extended EDP Pressure 22  mmHg  QP/QS 1  TPVR Index 8.18 HRUI  TSVR Index 34.67 HRUI  PVR SVR Ratio 0.11  TPVR/TSVR Ratio 0.24     Impression:  I have personally reviewed and interpreted the recent cardiac cath and CTA of the chest from 2014. She has no significant coronary obstruction and hemodynamics consistent with severe AS with a mean gradient of 53.8 mm Hg. Her CTA in 2014 showed the ascending aorta to be mildly enlarged with a maximum diameter at the right PA of 38 x 37 mm. The aortic root appeared normal sized. I think the CT should be repeated to get an accurate size of the ascending aorta now since it has been 3 years since the last study. She has mild renal dysfunction so this can be done without contrast. If the aorta is greater than 45 mm then I would replace it. I discussed the options for valve prosthesis with her and her husband including mechanical and tissue valves. We discussed the need for lifelong anticoagulation using coumadin for a mechanical valve, the needed follow up of the INR and the 1-2% per year risk of thromboembolism and bleeding. We also discussed the  risk of structural valve deterioration using a bovine pericardial valve in her age group. I think the risk is still relatively low and if it occurs the valve can be replaced with a transcatheter valve as long as the initial pericardial valve is of adequate size, usually 23 mm or greater. She would rather avoid Coumadin if possible and would like to use a pericardial valve if possible. I told her that I would try to do that as long as I can insert a large enough valve. Otherwise a mechanical valve is a better choice. She does not have any contraindication to long-term anticoagulation. I discussed the operative procedure with the patient and her husband including alternatives, benefits and risks; including but not limited to bleeding, blood transfusion, infection, stroke, myocardial infarction, heart block requiring a permanent pacemaker, organ dysfunction, and death.  Lodema Pilot understands and agrees to proceed.    Plan:  She will have a CT scan of the chest without contrast to evaluate her ascending aorta and I will call her with the results. She has been scheduled for AVR using a pericardial valve if possible on 01/03/2016. We will decide about the need for ascending aortic replacement after her CT scan is done.   Gaye Pollack, MD [facility] [phone]

## 2015-12-23 ENCOUNTER — Other Ambulatory Visit: Payer: Self-pay | Admitting: *Deleted

## 2015-12-23 DIAGNOSIS — I350 Nonrheumatic aortic (valve) stenosis: Secondary | ICD-10-CM

## 2015-12-29 ENCOUNTER — Ambulatory Visit
Admission: RE | Admit: 2015-12-29 | Discharge: 2015-12-29 | Disposition: A | Discharge status: Home / Self Care | Payer: BC Managed Care – PPO | Source: Ambulatory Visit | Attending: Surgery | Admitting: Surgery

## 2015-12-29 DIAGNOSIS — I712 Thoracic aortic aneurysm, without rupture: Secondary | ICD-10-CM

## 2015-12-29 DIAGNOSIS — I7121 Aneurysm of the ascending aorta, without rupture: Secondary | ICD-10-CM

## 2015-12-30 ENCOUNTER — Ambulatory Visit (HOSPITAL_COMMUNITY)
Admission: RE | Admit: 2015-12-30 | Discharge: 2015-12-30 | Disposition: A | Discharge status: Home / Self Care | Payer: BC Managed Care – PPO | Source: Ambulatory Visit | Attending: Surgery | Admitting: Surgery

## 2015-12-30 ENCOUNTER — Encounter (HOSPITAL_COMMUNITY): Payer: Self-pay

## 2015-12-30 ENCOUNTER — Ambulatory Visit (HOSPITAL_BASED_OUTPATIENT_CLINIC_OR_DEPARTMENT_OTHER)
Admission: RE | Admit: 2015-12-30 | Discharge: 2015-12-30 | Disposition: A | Discharge status: Home / Self Care | Payer: BC Managed Care – PPO | Source: Ambulatory Visit | Attending: Surgery | Admitting: Surgery

## 2015-12-30 ENCOUNTER — Encounter (HOSPITAL_COMMUNITY)
Admission: RE | Admit: 2015-12-30 | Discharge: 2015-12-30 | Disposition: A | Discharge status: Home / Self Care | Payer: BC Managed Care – PPO | Source: Ambulatory Visit | Attending: Surgery | Admitting: Surgery

## 2015-12-30 DIAGNOSIS — I350 Nonrheumatic aortic (valve) stenosis: Secondary | ICD-10-CM | POA: Insufficient documentation

## 2015-12-30 DIAGNOSIS — I6523 Occlusion and stenosis of bilateral carotid arteries: Secondary | ICD-10-CM | POA: Insufficient documentation

## 2015-12-30 DIAGNOSIS — Z01812 Encounter for preprocedural laboratory examination: Secondary | ICD-10-CM | POA: Diagnosis not present

## 2015-12-30 DIAGNOSIS — Z01810 Encounter for preprocedural cardiovascular examination: Secondary | ICD-10-CM | POA: Insufficient documentation

## 2015-12-30 HISTORY — DX: Dyspnea, unspecified: R06.00

## 2015-12-30 HISTORY — DX: Nocturia: R35.1

## 2015-12-30 HISTORY — DX: Anesthesia of skin: R20.0

## 2015-12-30 HISTORY — DX: Insomnia, unspecified: G47.00

## 2015-12-30 LAB — TYPE AND SCREEN
ABO/RH(D): B POS
ANTIBODY SCREEN: NEGATIVE

## 2015-12-30 LAB — PULMONARY FUNCTION TEST
DL/VA % pred: 63 %
DL/VA: 3.29 ml/min/mmHg/L
DLCO unc % pred: 52 %
DLCO unc: 14.91 ml/min/mmHg
FEF 25-75 Post: 2.77 L/sec
FEF 25-75 Pre: 2.06 L/sec
FEF2575-%CHANGE-POST: 34 %
FEF2575-%PRED-POST: 113 %
FEF2575-%Pred-Pre: 84 %
FEV1-%CHANGE-POST: 5 %
FEV1-%PRED-PRE: 80 %
FEV1-%Pred-Post: 85 %
FEV1-POST: 2.38 L
FEV1-PRE: 2.26 L
FEV1FVC-%CHANGE-POST: 1 %
FEV1FVC-%Pred-Pre: 102 %
FEV6-%Change-Post: 2 %
FEV6-%PRED-POST: 82 %
FEV6-%PRED-PRE: 80 %
FEV6-PRE: 2.82 L
FEV6-Post: 2.9 L
FEV6FVC-%Change-Post: 0 %
FEV6FVC-%PRED-PRE: 104 %
FEV6FVC-%Pred-Post: 103 %
FVC-%CHANGE-POST: 3 %
FVC-%PRED-POST: 80 %
FVC-%Pred-Pre: 77 %
FVC-Post: 2.92 L
FVC-Pre: 2.82 L
POST FEV1/FVC RATIO: 81 %
PRE FEV6/FVC RATIO: 100 %
Post FEV6/FVC ratio: 99 %
Pre FEV1/FVC ratio: 80 %
RV % PRED: 115 %
RV: 2.52 L
TLC % pred: 101 %
TLC: 5.6 L

## 2015-12-30 LAB — COMPREHENSIVE METABOLIC PANEL
ALT: 14 U/L (ref 14–54)
ANION GAP: 8 (ref 5–15)
AST: 18 U/L (ref 15–41)
Albumin: 3.9 g/dL (ref 3.5–5.0)
Alkaline Phosphatase: 74 U/L (ref 38–126)
BILIRUBIN TOTAL: 0.4 mg/dL (ref 0.3–1.2)
BUN: 22 mg/dL — ABNORMAL HIGH (ref 6–20)
CHLORIDE: 106 mmol/L (ref 101–111)
CO2: 22 mmol/L (ref 22–32)
Calcium: 9.6 mg/dL (ref 8.9–10.3)
Creatinine, Ser: 1.13 mg/dL — ABNORMAL HIGH (ref 0.44–1.00)
GFR calc Af Amer: 60 mL/min — ABNORMAL LOW (ref >60)
GFR, EST NON AFRICAN AMERICAN: 51 mL/min — AB (ref >60)
Glucose, Bld: 183 mg/dL — ABNORMAL HIGH (ref 65–99)
POTASSIUM: 4.2 mmol/L (ref 3.5–5.1)
Sodium: 136 mmol/L (ref 135–145)
TOTAL PROTEIN: 6.9 g/dL (ref 6.5–8.1)

## 2015-12-30 LAB — BLOOD GAS, ARTERIAL
ACID-BASE DEFICIT: 4.8 mmol/L — AB (ref 0.0–2.0)
BICARBONATE: 19.4 mmol/L — AB (ref 20.0–28.0)
Drawn by: 244851
O2 SAT: 97.4 %
PCO2 ART: 33.7 mmHg (ref 32.0–48.0)
PH ART: 7.379 (ref 7.350–7.450)
Patient temperature: 98.6
pO2, Arterial: 98.2 mmHg (ref 83.0–108.0)

## 2015-12-30 LAB — URINALYSIS, ROUTINE W REFLEX MICROSCOPIC
BILIRUBIN URINE: NEGATIVE
HGB URINE DIPSTICK: NEGATIVE
Ketones, ur: NEGATIVE mg/dL
NITRITE: NEGATIVE
PH: 5.5 (ref 5.0–8.0)
Protein, ur: NEGATIVE mg/dL
SPECIFIC GRAVITY, URINE: 1.02 (ref 1.005–1.030)

## 2015-12-30 LAB — CBC
HEMATOCRIT: 36.2 % (ref 36.0–46.0)
Hemoglobin: 12.2 g/dL (ref 12.0–15.0)
MCH: 28.4 pg (ref 26.0–34.0)
MCHC: 33.7 g/dL (ref 30.0–36.0)
MCV: 84.4 fL (ref 78.0–100.0)
PLATELETS: 374 10*3/uL (ref 150–400)
RBC: 4.29 MIL/uL (ref 3.87–5.11)
RDW: 13.1 % (ref 11.5–15.5)
WBC: 10.4 10*3/uL (ref 4.0–10.5)

## 2015-12-30 LAB — GLUCOSE, CAPILLARY: GLUCOSE-CAPILLARY: 153 mg/dL — AB (ref 65–99)

## 2015-12-30 LAB — ABO/RH: ABO/RH(D): B POS

## 2015-12-30 LAB — URINE MICROSCOPIC-ADD ON

## 2015-12-30 LAB — PROTIME-INR
INR: 0.95
PROTHROMBIN TIME: 12.7 s (ref 11.4–15.2)

## 2015-12-30 LAB — SURGICAL PCR SCREEN
MRSA, PCR: NEGATIVE
Staphylococcus aureus: NEGATIVE

## 2015-12-30 LAB — APTT: APTT: 28 s (ref 24–36)

## 2015-12-30 MED ORDER — CHLORHEXIDINE GLUCONATE 4 % EX LIQD: 30.0000 mL | CUTANEOUS | Status: DC

## 2015-12-30 MED ORDER — ALBUTEROL SULFATE (2.5 MG/3ML) 0.083% IN NEBU
2.5000 mg | INHALATION_SOLUTION | Freq: Once | RESPIRATORY_TRACT | Status: AC
  Administered 2015-12-30: 2.5 mg via RESPIRATORY_TRACT

## 2015-12-30 NOTE — Progress Notes (Signed)
Pre-op Cardiac Surgery  Carotid Findings:  Findings consistent with 1-39 percent stenosis involving the right internal carotid artery and the left internal carotid artery.  Vertebral arteries demonstrate antegrade flow bilaterally.  Upper Extremity Right Left  Brachial Pressures 163 152  Radial Waveforms Triphasic Triphasic  Ulnar Waveforms Triphasic Biphasic  Palmar Arch (Allen's Test) Arch remained unchanged with radial compression, decreased greater than 50% with ulnar compression. Arch reversed with radial compression and remained unchanged with ulnar compression.   [place], RVT 10:51 AM  12/30/2015

## 2015-12-30 NOTE — Pre-Procedure Instructions (Signed)
TAMMARA FARIS  12/30/2015      [address], [place] [address] Phone: [phone] Fax: [phone]    Your procedure is scheduled on Tues, Oct 31 @ 7:30 AM  Report to [facility] Admitting at 5:30 AM  Call this number if you have problems the morning of surgery:  [phone]   Remember:  Do not eat food or drink liquids after midnight.  Take these medicines the morning of surgery with A SIP OF WATER Amlodipine(Norvasc) and Eye Drops              Stop taking your Ibuprofen now. No Goody's,BC's,Aleve,Advil,Motrin,Fish Oil,or any Herbal Medications.      How to Manage Your Diabetes Before and After Surgery  Why is it important to control my blood sugar before and after surgery? . Improving blood sugar levels before and after surgery helps healing and can limit problems. . A way of improving blood sugar control is eating a healthy diet by: o  Eating less sugar and carbohydrates o  Increasing activity/exercise o  Talking with your doctor about reaching your blood sugar goals . High blood sugars (greater than 180 mg/dL) can raise your risk of infections and slow your recovery, so you will need to focus on controlling your diabetes during the weeks before surgery. . Make sure that the doctor who takes care of your diabetes knows about your planned surgery including the date and location.  How do I manage my blood sugar before surgery? . Check your blood sugar at least 4 times a day, starting 2 days before surgery, to make sure that the level is not too high or low. o Check your blood sugar the morning of your surgery when you wake up and every 2 hours until you get to the Short Stay unit. . If your blood sugar is less than 70 mg/dL, you will need to treat for low blood sugar: o Do not take insulin. o Treat a low blood sugar (less than 70 mg/dL) with  cup of clear juice (cranberry or apple), 4 glucose  tablets, OR glucose gel. o Recheck blood sugar in 15 minutes after treatment (to make sure it is greater than 70 mg/dL). If your blood sugar is not greater than 70 mg/dL on recheck, call [phone] for further instructions. . Report your blood sugar to the short stay nurse when you get to Short Stay.  . If you are admitted to the hospital after surgery: o Your blood sugar will be checked by the staff and you will probably be given insulin after surgery (instead of oral diabetes medicines) to make sure you have good blood sugar levels. o The goal for blood sugar control after surgery is 80-180 mg/dL.              WHAT DO I DO ABOUT MY DIABETES MEDICATION?   Marland Kitchen Do not take oral diabetes medicines (pills) the morning of surgery.  . THE NIGHT BEFORE SURGERY, take_12_ units of _____Levemir______insulin.       . The day of surgery, do not take other diabetes injectables, including Byetta (exenatide), Bydureon (exenatide ER), Victoza (liraglutide), or Trulicity (dulaglutide).  . If your CBG is greater than 220 mg/dL, you may take  of your sliding scale (correction) dose of insulin.  Other Instructions:          Patient Signature:  Date:   Nurse Signature:  Date:  Reviewed and Endorsed by [facility] Patient Education Committee, August 2015   Do not wear jewelry, make-up or nail polish.  Do not wear lotions, powders, perfumes, or deoderant.  Do not shave 48 hours prior to surgery.    Do not bring valuables to the hospital.  [facility] is not responsible for any belongings or valuables.  Contacts, dentures or bridgework may not be worn into surgery.  Leave your suitcase in the car.  After surgery it may be brought to your room.  For patients admitted to the hospital, discharge time will be determined by your treatment team.  Patients discharged the day of surgery will not be allowed to drive home.    [facility]  Before  surgery, you can play an important role.  Because skin is not sterile, your skin needs to be as free of germs as possible.  You can reduce the number of germs on you skin by washing with CHG (chlorahexidine gluconate) soap before surgery.  CHG is an antiseptic cleaner which kills germs and bonds with the skin to continue killing germs even after washing.  Please DO NOT use if you have an allergy to CHG or antibacterial soaps.  If your skin becomes reddened/irritated stop using the CHG and inform your nurse when you arrive at Short Stay.  Do not shave (including legs and underarms) for at least 48 hours prior to the first CHG shower.  You may shave your face.  Please follow these instructions carefully:   1.  Shower with CHG Soap the night before surgery and the                                morning of Surgery.  2.  If you choose to wash your hair, wash your hair first as usual with your       normal shampoo.  3.  After you shampoo, rinse your hair and body thoroughly to remove the                      Shampoo.  4.  Use CHG as you would any other liquid soap.  You can apply chg directly       to the skin and wash gently with scrungie or a clean washcloth.  5.  Apply the CHG Soap to your body ONLY FROM THE NECK DOWN.        Do not use on open wounds or open sores.  Avoid contact with your eyes,       ears, mouth and genitals (private parts).  Wash genitals (private parts)       with your normal soap.  6.  Wash thoroughly, paying special attention to the area where your surgery        will be performed.  7.  Thoroughly rinse your body with warm water from the neck down.  8.  DO NOT shower/wash with your normal soap after using and rinsing off       the CHG Soap.  9.  Pat yourself dry with a clean towel.            10.  Wear clean pajamas.            11.  Place clean sheets on your bed the night of your first shower and do not        sleep with pets.  Day of Surgery  Do not apply any  lotions/deoderants the morning of surgery.  Please wear clean clothes to the hospital/surgery center.    Please read over the following fact sheets that you were given. Pain Booklet, Coughing and Deep Breathing, MRSA Information and Surgical Site Infection Prevention

## 2015-12-30 NOTE — Progress Notes (Addendum)
Cardiologist is Dr.McDowell with last visit in epic from 12-09-15  Medical Md is Dr.Scott Luray  Echo reports in epic from 2012/2014/2017  Stress test report in epic from 12-01-15  Heart cath report in epic from 12-16-15  EKG in epic from 12-16-15

## 2015-12-31 LAB — VAS US DOPPLER PRE CABG
LCCAPSYS: 109 cm/s
LEFT ECA DIAS: 21 cm/s
LEFT VERTEBRAL DIAS: -13 cm/s
LICADDIAS: -35 cm/s
LICAPDIAS: -25 cm/s
LICAPSYS: -97 cm/s
Left CCA dist dias: 26 cm/s
Left CCA dist sys: 105 cm/s
Left CCA prox dias: 22 cm/s
Left ICA dist sys: -105 cm/s
RCCAPDIAS: 28 cm/s
RIGHT ECA DIAS: -20 cm/s
RIGHT VERTEBRAL DIAS: 27 cm/s
Right CCA prox sys: 106 cm/s
Right cca dist sys: -106 cm/s

## 2015-12-31 LAB — HEMOGLOBIN A1C
HEMOGLOBIN A1C: 7.8 % — AB (ref 4.8–5.6)
MEAN PLASMA GLUCOSE: 177 mg/dL

## 2016-01-02 ENCOUNTER — Encounter (HOSPITAL_COMMUNITY): Payer: Self-pay | Admitting: Anesthesiology

## 2016-01-02 ENCOUNTER — Ambulatory Visit (HOSPITAL_COMMUNITY)
Admission: RE | Admit: 2016-01-02 | Discharge: 2016-01-02 | Disposition: A | Discharge status: Home / Self Care | Payer: BC Managed Care – PPO | Source: Ambulatory Visit | Attending: Family Medicine | Admitting: Family Medicine

## 2016-01-02 DIAGNOSIS — Z1231 Encounter for screening mammogram for malignant neoplasm of breast: Secondary | ICD-10-CM

## 2016-01-02 MED ORDER — DOPAMINE-DEXTROSE 3.2-5 MG/ML-% IV SOLN
0.0000 ug/kg/min | INTRAVENOUS | Status: DC
Start: 2016-01-03 — End: 2016-01-03
  Filled 2016-01-02: qty 250

## 2016-01-02 MED ORDER — PHENYLEPHRINE HCL 10 MG/ML IJ SOLN
30.0000 ug/min | INTRAMUSCULAR | Status: AC
  Administered 2016-01-03 (×2): 50 ug/min via INTRAVENOUS
  Filled 2016-01-02: qty 2

## 2016-01-02 MED ORDER — DEXTROSE 5 % IV SOLN
750.0000 mg | INTRAVENOUS | Status: DC
  Filled 2016-01-02: qty 750

## 2016-01-02 MED ORDER — DEXMEDETOMIDINE HCL IN NACL 400 MCG/100ML IV SOLN
0.1000 ug/kg/h | INTRAVENOUS | Status: AC
  Administered 2016-01-03: .2 ug/kg/h via INTRAVENOUS
  Filled 2016-01-02: qty 100

## 2016-01-02 MED ORDER — METOPROLOL TARTRATE 12.5 MG HALF TABLET
12.5000 mg | ORAL_TABLET | Freq: Once | ORAL | Status: AC
  Administered 2016-01-03: 12.5 mg via ORAL
  Filled 2016-01-02: qty 1

## 2016-01-02 MED ORDER — CEFUROXIME SODIUM 1.5 G IJ SOLR
1.5000 g | INTRAMUSCULAR | Status: AC
  Administered 2016-01-03: .75 g via INTRAVENOUS
  Administered 2016-01-03: 1.5 g via INTRAVENOUS
  Filled 2016-01-02 (×2): qty 1.5

## 2016-01-02 MED ORDER — MAGNESIUM SULFATE 50 % IJ SOLN
40.0000 meq | INTRAMUSCULAR | Status: DC
  Filled 2016-01-02: qty 10

## 2016-01-02 MED ORDER — VANCOMYCIN HCL 10 G IV SOLR
1500.0000 mg | INTRAVENOUS | Status: AC
  Administered 2016-01-03: 1500 mg via INTRAVENOUS
  Filled 2016-01-02: qty 1500

## 2016-01-02 MED ORDER — TRANEXAMIC ACID 1000 MG/10ML IV SOLN
1.5000 mg/kg/h | INTRAVENOUS | Status: AC
  Administered 2016-01-03: 1.5 mg/kg/h via INTRAVENOUS
  Filled 2016-01-02: qty 25

## 2016-01-02 MED ORDER — HEPARIN SODIUM (PORCINE) 1000 UNIT/ML IJ SOLN
INTRAMUSCULAR | Status: DC
  Filled 2016-01-02: qty 30

## 2016-01-02 MED ORDER — CHLORHEXIDINE GLUCONATE 0.12 % MT SOLN
15.0000 mL | OROMUCOSAL | Status: AC
  Administered 2016-01-03: 15 mL via OROMUCOSAL
  Filled 2016-01-02: qty 15

## 2016-01-02 MED ORDER — TRANEXAMIC ACID (OHS) BOLUS VIA INFUSION
15.0000 mg/kg | INTRAVENOUS | Status: AC
  Administered 2016-01-03: 1539 mg via INTRAVENOUS
  Filled 2016-01-02: qty 1539

## 2016-01-02 MED ORDER — SODIUM CHLORIDE 0.9 % IV SOLN
INTRAVENOUS | Status: AC
  Administered 2016-01-03: 2.4 [IU]/h via INTRAVENOUS
  Filled 2016-01-02: qty 2.5

## 2016-01-02 MED ORDER — DEXTROSE 5 % IV SOLN
0.0000 ug/min | INTRAVENOUS | Status: DC
  Filled 2016-01-02: qty 4

## 2016-01-02 MED ORDER — TRANEXAMIC ACID (OHS) PUMP PRIME SOLUTION
2.0000 mg/kg | INTRAVENOUS | Status: DC
  Filled 2016-01-02: qty 2.05

## 2016-01-02 MED ORDER — NITROGLYCERIN IN D5W 200-5 MCG/ML-% IV SOLN
2.0000 ug/min | INTRAVENOUS | Status: DC
  Filled 2016-01-02: qty 250

## 2016-01-02 MED ORDER — PLASMA-LYTE 148 IV SOLN
INTRAVENOUS | Status: DC
  Filled 2016-01-02: qty 2.5

## 2016-01-02 MED ORDER — POTASSIUM CHLORIDE 2 MEQ/ML IV SOLN
80.0000 meq | INTRAVENOUS | Status: DC
  Filled 2016-01-02: qty 40

## 2016-01-03 ENCOUNTER — Inpatient Hospital Stay (HOSPITAL_COMMUNITY): Payer: BC Managed Care – PPO

## 2016-01-03 ENCOUNTER — Encounter (HOSPITAL_COMMUNITY): Payer: Self-pay | Admitting: *Deleted

## 2016-01-03 ENCOUNTER — Inpatient Hospital Stay (HOSPITAL_COMMUNITY): Payer: BC Managed Care – PPO | Admitting: Anesthesiology

## 2016-01-03 ENCOUNTER — Encounter (HOSPITAL_COMMUNITY)
Admission: RE | Disposition: A | Discharge status: Home / Self Care | Payer: Self-pay | Source: Ambulatory Visit | Attending: Surgery

## 2016-01-03 ENCOUNTER — Inpatient Hospital Stay (HOSPITAL_COMMUNITY)
Admission: RE | Admit: 2016-01-03 | Discharge: 2016-01-11 | DRG: 220 | Disposition: A | Discharge status: Home / Self Care | Payer: BC Managed Care – PPO | Source: Ambulatory Visit | Attending: Surgery | Admitting: Surgery

## 2016-01-03 DIAGNOSIS — I350 Nonrheumatic aortic (valve) stenosis: Secondary | ICD-10-CM

## 2016-01-03 DIAGNOSIS — E1122 Type 2 diabetes mellitus with diabetic chronic kidney disease: Secondary | ICD-10-CM | POA: Diagnosis present

## 2016-01-03 DIAGNOSIS — I471 Supraventricular tachycardia: Secondary | ICD-10-CM | POA: Diagnosis not present

## 2016-01-03 DIAGNOSIS — Z8249 Family history of ischemic heart disease and other diseases of the circulatory system: Secondary | ICD-10-CM | POA: Diagnosis not present

## 2016-01-03 DIAGNOSIS — Q230 Congenital stenosis of aortic valve: Secondary | ICD-10-CM | POA: Diagnosis present

## 2016-01-03 DIAGNOSIS — I2510 Atherosclerotic heart disease of native coronary artery without angina pectoris: Secondary | ICD-10-CM | POA: Diagnosis present

## 2016-01-03 DIAGNOSIS — R197 Diarrhea, unspecified: Secondary | ICD-10-CM | POA: Diagnosis not present

## 2016-01-03 DIAGNOSIS — I129 Hypertensive chronic kidney disease with stage 1 through stage 4 chronic kidney disease, or unspecified chronic kidney disease: Secondary | ICD-10-CM | POA: Diagnosis present

## 2016-01-03 DIAGNOSIS — E782 Mixed hyperlipidemia: Secondary | ICD-10-CM | POA: Diagnosis present

## 2016-01-03 DIAGNOSIS — Z23 Encounter for immunization: Secondary | ICD-10-CM | POA: Diagnosis not present

## 2016-01-03 DIAGNOSIS — N182 Chronic kidney disease, stage 2 (mild): Secondary | ICD-10-CM | POA: Diagnosis present

## 2016-01-03 DIAGNOSIS — Z833 Family history of diabetes mellitus: Secondary | ICD-10-CM | POA: Diagnosis not present

## 2016-01-03 DIAGNOSIS — Q231 Congenital insufficiency of aortic valve: Secondary | ICD-10-CM | POA: Diagnosis not present

## 2016-01-03 DIAGNOSIS — I493 Ventricular premature depolarization: Secondary | ICD-10-CM | POA: Diagnosis not present

## 2016-01-03 DIAGNOSIS — D72829 Elevated white blood cell count, unspecified: Secondary | ICD-10-CM | POA: Diagnosis present

## 2016-01-03 DIAGNOSIS — G2581 Restless legs syndrome: Secondary | ICD-10-CM | POA: Diagnosis present

## 2016-01-03 DIAGNOSIS — E8770 Fluid overload, unspecified: Secondary | ICD-10-CM | POA: Diagnosis not present

## 2016-01-03 DIAGNOSIS — Z952 Presence of prosthetic heart valve: Secondary | ICD-10-CM

## 2016-01-03 HISTORY — PX: TEE WITHOUT CARDIOVERSION: SHX5443

## 2016-01-03 HISTORY — PX: AORTIC VALVE REPLACEMENT: SHX41

## 2016-01-03 LAB — POCT I-STAT, CHEM 8
BUN: 18 mg/dL (ref 6–20)
BUN: 15 mg/dL (ref 6–20)
BUN: 17 mg/dL (ref 6–20)
BUN: 16 mg/dL (ref 6–20)
BUN: 16 mg/dL (ref 6–20)
BUN: 14 mg/dL (ref 6–20)
CALCIUM ION: 1.3 mmol/L (ref 1.15–1.40)
CALCIUM ION: 1.07 mmol/L — AB (ref 1.15–1.40)
CALCIUM ION: 1.22 mmol/L (ref 1.15–1.40)
CALCIUM ION: 1.14 mmol/L — AB (ref 1.15–1.40)
CHLORIDE: 104 mmol/L (ref 101–111)
CHLORIDE: 106 mmol/L (ref 101–111)
CHLORIDE: 108 mmol/L (ref 101–111)
CREATININE: 0.7 mg/dL (ref 0.44–1.00)
CREATININE: 0.6 mg/dL (ref 0.44–1.00)
CREATININE: 0.7 mg/dL (ref 0.44–1.00)
CREATININE: 0.7 mg/dL (ref 0.44–1.00)
CREATININE: 0.8 mg/dL (ref 0.44–1.00)
Calcium, Ion: 1.16 mmol/L (ref 1.15–1.40)
Calcium, Ion: 1.25 mmol/L (ref 1.15–1.40)
Chloride: 102 mmol/L (ref 101–111)
Chloride: 100 mmol/L — ABNORMAL LOW (ref 101–111)
Chloride: 104 mmol/L (ref 101–111)
Creatinine, Ser: 0.6 mg/dL (ref 0.44–1.00)
GLUCOSE: 182 mg/dL — AB (ref 65–99)
GLUCOSE: 191 mg/dL — AB (ref 65–99)
GLUCOSE: 182 mg/dL — AB (ref 65–99)
GLUCOSE: 177 mg/dL — AB (ref 65–99)
GLUCOSE: 127 mg/dL — AB (ref 65–99)
Glucose, Bld: 160 mg/dL — ABNORMAL HIGH (ref 65–99)
HCT: 33 % — ABNORMAL LOW (ref 36.0–46.0)
HCT: 30 % — ABNORMAL LOW (ref 36.0–46.0)
HCT: 23 % — ABNORMAL LOW (ref 36.0–46.0)
HEMATOCRIT: 25 % — AB (ref 36.0–46.0)
HEMATOCRIT: 24 % — AB (ref 36.0–46.0)
HEMATOCRIT: 29 % — AB (ref 36.0–46.0)
HEMOGLOBIN: 8.5 g/dL — AB (ref 12.0–15.0)
HEMOGLOBIN: 8.2 g/dL — AB (ref 12.0–15.0)
HEMOGLOBIN: 7.8 g/dL — AB (ref 12.0–15.0)
HEMOGLOBIN: 9.9 g/dL — AB (ref 12.0–15.0)
Hemoglobin: 11.2 g/dL — ABNORMAL LOW (ref 12.0–15.0)
Hemoglobin: 10.2 g/dL — ABNORMAL LOW (ref 12.0–15.0)
POTASSIUM: 4.3 mmol/L (ref 3.5–5.1)
POTASSIUM: 4.6 mmol/L (ref 3.5–5.1)
Potassium: 3.9 mmol/L (ref 3.5–5.1)
Potassium: 4.2 mmol/L (ref 3.5–5.1)
Potassium: 3.9 mmol/L (ref 3.5–5.1)
Potassium: 4.1 mmol/L (ref 3.5–5.1)
SODIUM: 139 mmol/L (ref 135–145)
SODIUM: 141 mmol/L (ref 135–145)
SODIUM: 139 mmol/L (ref 135–145)
Sodium: 141 mmol/L (ref 135–145)
Sodium: 136 mmol/L (ref 135–145)
Sodium: 139 mmol/L (ref 135–145)
TCO2: 26 mmol/L (ref 0–100)
TCO2: 24 mmol/L (ref 0–100)
TCO2: 25 mmol/L (ref 0–100)
TCO2: 24 mmol/L (ref 0–100)
TCO2: 26 mmol/L (ref 0–100)
TCO2: 24 mmol/L (ref 0–100)

## 2016-01-03 LAB — CREATININE, SERUM
Creatinine, Ser: 0.93 mg/dL (ref 0.44–1.00)
GFR calc non Af Amer: >60 mL/min (ref >60)

## 2016-01-03 LAB — POCT I-STAT 3, ART BLOOD GAS (G3+)
ACID-BASE DEFICIT: 4 mmol/L — AB (ref 0.0–2.0)
ACID-BASE DEFICIT: 4 mmol/L — AB (ref 0.0–2.0)
Acid-base deficit: 4 mmol/L — ABNORMAL HIGH (ref 0.0–2.0)
BICARBONATE: 21.5 mmol/L (ref 20.0–28.0)
BICARBONATE: 21.3 mmol/L (ref 20.0–28.0)
BICARBONATE: 21.3 mmol/L (ref 20.0–28.0)
Bicarbonate: 24.8 mmol/L (ref 20.0–28.0)
O2 SAT: 100 %
O2 SAT: 99 %
O2 Saturation: 99 %
O2 Saturation: 99 %
PCO2 ART: 39.3 mmHg (ref 32.0–48.0)
PCO2 ART: 41 mmHg (ref 32.0–48.0)
PCO2 ART: 38.4 mmHg (ref 32.0–48.0)
PO2 ART: 159 mmHg — AB (ref 83.0–108.0)
PO2 ART: 163 mmHg — AB (ref 83.0–108.0)
PO2 ART: 156 mmHg — AB (ref 83.0–108.0)
Patient temperature: 36.5
TCO2: 26 mmol/L (ref 0–100)
TCO2: 23 mmol/L (ref 0–100)
TCO2: 22 mmol/L (ref 0–100)
TCO2: 23 mmol/L (ref 0–100)
pCO2 arterial: 40.5 mmHg (ref 32.0–48.0)
pH, Arterial: 7.407 (ref 7.350–7.450)
pH, Arterial: 7.324 — ABNORMAL LOW (ref 7.350–7.450)
pH, Arterial: 7.352 (ref 7.350–7.450)
pH, Arterial: 7.329 — ABNORMAL LOW (ref 7.350–7.450)
pO2, Arterial: 415 mmHg — ABNORMAL HIGH (ref 83.0–108.0)

## 2016-01-03 LAB — PROTIME-INR
INR: 1.27
PROTHROMBIN TIME: 16 s — AB (ref 11.4–15.2)

## 2016-01-03 LAB — CBC
HCT: 30.2 % — ABNORMAL LOW (ref 36.0–46.0)
HCT: 30.6 % — ABNORMAL LOW (ref 36.0–46.0)
Hemoglobin: 10.1 g/dL — ABNORMAL LOW (ref 12.0–15.0)
Hemoglobin: 10.3 g/dL — ABNORMAL LOW (ref 12.0–15.0)
MCH: 28.4 pg (ref 26.0–34.0)
MCH: 28.6 pg (ref 26.0–34.0)
MCHC: 33.4 g/dL (ref 30.0–36.0)
MCHC: 33.7 g/dL (ref 30.0–36.0)
MCV: 84.8 fL (ref 78.0–100.0)
MCV: 85 fL (ref 78.0–100.0)
PLATELETS: 212 10*3/uL (ref 150–400)
PLATELETS: 231 10*3/uL (ref 150–400)
RBC: 3.56 MIL/uL — AB (ref 3.87–5.11)
RBC: 3.6 MIL/uL — ABNORMAL LOW (ref 3.87–5.11)
RDW: 13 % (ref 11.5–15.5)
RDW: 13.1 % (ref 11.5–15.5)
WBC: 13.7 10*3/uL — AB (ref 4.0–10.5)
WBC: 13.4 10*3/uL — AB (ref 4.0–10.5)

## 2016-01-03 LAB — GLUCOSE, CAPILLARY
GLUCOSE-CAPILLARY: 103 mg/dL — AB (ref 65–99)
GLUCOSE-CAPILLARY: 105 mg/dL — AB (ref 65–99)
GLUCOSE-CAPILLARY: 114 mg/dL — AB (ref 65–99)
GLUCOSE-CAPILLARY: 117 mg/dL — AB (ref 65–99)
GLUCOSE-CAPILLARY: 122 mg/dL — AB (ref 65–99)
GLUCOSE-CAPILLARY: 145 mg/dL — AB (ref 65–99)
GLUCOSE-CAPILLARY: 97 mg/dL (ref 65–99)
Glucose-Capillary: 157 mg/dL — ABNORMAL HIGH (ref 65–99)
Glucose-Capillary: 100 mg/dL — ABNORMAL HIGH (ref 65–99)
Glucose-Capillary: 121 mg/dL — ABNORMAL HIGH (ref 65–99)
Glucose-Capillary: 173 mg/dL — ABNORMAL HIGH (ref 65–99)
Glucose-Capillary: 125 mg/dL — ABNORMAL HIGH (ref 65–99)

## 2016-01-03 LAB — PLATELET COUNT: Platelets: 268 10*3/uL (ref 150–400)

## 2016-01-03 LAB — POCT I-STAT 4, (NA,K, GLUC, HGB,HCT)
Glucose, Bld: 121 mg/dL — ABNORMAL HIGH (ref 65–99)
HCT: 28 % — ABNORMAL LOW (ref 36.0–46.0)
Hemoglobin: 9.5 g/dL — ABNORMAL LOW (ref 12.0–15.0)
Potassium: 4 mmol/L (ref 3.5–5.1)
SODIUM: 142 mmol/L (ref 135–145)

## 2016-01-03 LAB — HEMOGLOBIN AND HEMATOCRIT, BLOOD
HCT: 24.4 % — ABNORMAL LOW (ref 36.0–46.0)
Hemoglobin: 8.2 g/dL — ABNORMAL LOW (ref 12.0–15.0)

## 2016-01-03 LAB — APTT: APTT: 31 s (ref 24–36)

## 2016-01-03 LAB — MAGNESIUM: MAGNESIUM: 2.3 mg/dL (ref 1.7–2.4)

## 2016-01-03 SURGERY — REPLACEMENT, AORTIC VALVE, OPEN
Anesthesia: General | Site: Chest

## 2016-01-03 MED ORDER — TRAMADOL HCL 50 MG PO TABS: 50.0000 mg | ORAL_TABLET | ORAL | Status: DC | PRN

## 2016-01-03 MED ORDER — ACETAMINOPHEN 650 MG RE SUPP
650.0000 mg | Freq: Once | RECTAL | Status: AC
  Administered 2016-01-03: 650 mg via RECTAL

## 2016-01-03 MED ORDER — HEPARIN SODIUM (PORCINE) 1000 UNIT/ML IJ SOLN
INTRAMUSCULAR | Status: AC
  Filled 2016-01-03: qty 1

## 2016-01-03 MED ORDER — MIDAZOLAM HCL 5 MG/5ML IJ SOLN
INTRAMUSCULAR | Status: DC | PRN
  Administered 2016-01-03 (×3): 2 mg via INTRAVENOUS
  Administered 2016-01-03 (×2): 1 mg via INTRAVENOUS
  Administered 2016-01-03: 2 mg via INTRAVENOUS

## 2016-01-03 MED ORDER — MORPHINE SULFATE (PF) 2 MG/ML IV SOLN: 1.0000 mg | INTRAVENOUS | Status: AC | PRN

## 2016-01-03 MED ORDER — SODIUM CHLORIDE 0.9% FLUSH: 3.0000 mL | INTRAVENOUS | Status: DC | PRN

## 2016-01-03 MED ORDER — FENTANYL CITRATE (PF) 250 MCG/5ML IJ SOLN
INTRAMUSCULAR | Status: AC
  Filled 2016-01-03: qty 10

## 2016-01-03 MED ORDER — ROCURONIUM BROMIDE 10 MG/ML (PF) SYRINGE
PREFILLED_SYRINGE | INTRAVENOUS | Status: DC | PRN
  Administered 2016-01-03 (×3): 20 mg via INTRAVENOUS
  Administered 2016-01-03: 60 mg via INTRAVENOUS
  Administered 2016-01-03: 20 mg via INTRAVENOUS
  Administered 2016-01-03: 10 mg via INTRAVENOUS

## 2016-01-03 MED ORDER — METOPROLOL TARTRATE 25 MG/10 ML ORAL SUSPENSION: 12.5000 mg | Freq: Two times a day (BID) | ORAL | Status: DC

## 2016-01-03 MED ORDER — LIDOCAINE 2% (20 MG/ML) 5 ML SYRINGE
INTRAMUSCULAR | Status: AC
  Filled 2016-01-03: qty 5

## 2016-01-03 MED ORDER — FAMOTIDINE IN NACL 20-0.9 MG/50ML-% IV SOLN
20.0000 mg | Freq: Two times a day (BID) | INTRAVENOUS | Status: AC
  Administered 2016-01-03: 20 mg via INTRAVENOUS

## 2016-01-03 MED ORDER — LACTATED RINGERS IV SOLN
INTRAVENOUS | Status: DC | PRN
  Administered 2016-01-03 (×2): via INTRAVENOUS

## 2016-01-03 MED ORDER — SODIUM CHLORIDE 0.9 % IV SOLN
INTRAVENOUS | Status: DC
  Administered 2016-01-03: 2.7 [IU]/h via INTRAVENOUS
  Filled 2016-01-03 (×2): qty 2.5

## 2016-01-03 MED ORDER — LACTATED RINGERS IV SOLN
INTRAVENOUS | Status: DC
  Administered 2016-01-03: 20 mL/h via INTRAVENOUS

## 2016-01-03 MED ORDER — AMIODARONE HCL IN DEXTROSE 360-4.14 MG/200ML-% IV SOLN
30.0000 mg/h | INTRAVENOUS | Status: DC
Start: 2016-01-03 — End: 2016-01-04
  Administered 2016-01-03: 30 mg/h via INTRAVENOUS
  Filled 2016-01-03: qty 200

## 2016-01-03 MED ORDER — ROCURONIUM BROMIDE 10 MG/ML (PF) SYRINGE
PREFILLED_SYRINGE | INTRAVENOUS | Status: AC
  Filled 2016-01-03: qty 10

## 2016-01-03 MED ORDER — LACTATED RINGERS IV SOLN: INTRAVENOUS | Status: DC

## 2016-01-03 MED ORDER — MORPHINE SULFATE (PF) 2 MG/ML IV SOLN
2.0000 mg | INTRAVENOUS | Status: DC | PRN
  Administered 2016-01-03: 2 mg via INTRAVENOUS
  Administered 2016-01-03: 3 mg via INTRAVENOUS
  Administered 2016-01-03: 4 mg via INTRAVENOUS
  Administered 2016-01-03: 2 mg via INTRAVENOUS
  Administered 2016-01-03: 4 mg via INTRAVENOUS
  Administered 2016-01-04 (×2): 2 mg via INTRAVENOUS
  Filled 2016-01-03 (×2): qty 2
  Filled 2016-01-03: qty 1
  Filled 2016-01-03: qty 2
  Filled 2016-01-03 (×2): qty 1
  Filled 2016-01-03: qty 2

## 2016-01-03 MED ORDER — CHLORHEXIDINE GLUCONATE 0.12 % MT SOLN
15.0000 mL | OROMUCOSAL | Status: AC
  Administered 2016-01-03: 15 mL via OROMUCOSAL

## 2016-01-03 MED ORDER — HEPARIN SODIUM (PORCINE) 1000 UNIT/ML IJ SOLN
INTRAMUSCULAR | Status: DC | PRN
  Administered 2016-01-03: 34000 [IU] via INTRAVENOUS

## 2016-01-03 MED ORDER — METOPROLOL TARTRATE 5 MG/5ML IV SOLN: 2.5000 mg | INTRAVENOUS | Status: DC | PRN

## 2016-01-03 MED ORDER — PROTAMINE SULFATE 10 MG/ML IV SOLN
INTRAVENOUS | Status: DC | PRN
  Administered 2016-01-03 (×2): 50 mg via INTRAVENOUS
  Administered 2016-01-03 (×8): 25 mg via INTRAVENOUS

## 2016-01-03 MED ORDER — PROTAMINE SULFATE 10 MG/ML IV SOLN
INTRAVENOUS | Status: AC
  Filled 2016-01-03: qty 25

## 2016-01-03 MED ORDER — AMIODARONE HCL IN DEXTROSE 360-4.14 MG/200ML-% IV SOLN
30.0000 mg/h | INTRAVENOUS | Status: DC
  Administered 2016-01-03: 60 mg/h via INTRAVENOUS
  Filled 2016-01-03: qty 200

## 2016-01-03 MED ORDER — OXYCODONE HCL 5 MG PO TABS
5.0000 mg | ORAL_TABLET | ORAL | Status: DC | PRN
  Administered 2016-01-03 (×2): 5 mg via ORAL
  Administered 2016-01-04 (×3): 10 mg via ORAL
  Filled 2016-01-03 (×2): qty 2
  Filled 2016-01-03 (×2): qty 1
  Filled 2016-01-03: qty 2

## 2016-01-03 MED ORDER — FENTANYL CITRATE (PF) 250 MCG/5ML IJ SOLN
INTRAMUSCULAR | Status: DC | PRN
  Administered 2016-01-03 (×3): 50 ug via INTRAVENOUS
  Administered 2016-01-03: 100 ug via INTRAVENOUS
  Administered 2016-01-03 (×3): 50 ug via INTRAVENOUS
  Administered 2016-01-03: 100 ug via INTRAVENOUS
  Administered 2016-01-03 (×5): 50 ug via INTRAVENOUS

## 2016-01-03 MED ORDER — MIDAZOLAM HCL 2 MG/2ML IJ SOLN: 2.0000 mg | INTRAMUSCULAR | Status: DC | PRN

## 2016-01-03 MED ORDER — ALBUMIN HUMAN 5 % IV SOLN
250.0000 mL | INTRAVENOUS | Status: AC | PRN
  Administered 2016-01-03 (×2): 250 mL via INTRAVENOUS

## 2016-01-03 MED ORDER — FENTANYL CITRATE (PF) 250 MCG/5ML IJ SOLN
INTRAMUSCULAR | Status: AC
  Filled 2016-01-03: qty 5

## 2016-01-03 MED ORDER — ACETAMINOPHEN 500 MG PO TABS
1000.0000 mg | ORAL_TABLET | Freq: Four times a day (QID) | ORAL | Status: DC
  Administered 2016-01-04 – 2016-01-05 (×7): 1000 mg via ORAL
  Filled 2016-01-03 (×6): qty 2

## 2016-01-03 MED ORDER — DEXMEDETOMIDINE HCL IN NACL 200 MCG/50ML IV SOLN
0.0000 ug/kg/h | INTRAVENOUS | Status: DC
  Administered 2016-01-03: 0.7 ug/kg/h via INTRAVENOUS
  Filled 2016-01-03: qty 50

## 2016-01-03 MED ORDER — LIDOCAINE 2% (20 MG/ML) 5 ML SYRINGE
INTRAMUSCULAR | Status: DC | PRN
  Administered 2016-01-03: 60 mg via INTRAVENOUS

## 2016-01-03 MED ORDER — SODIUM CHLORIDE 0.9 % IJ SOLN
INTRAMUSCULAR | Status: AC
  Filled 2016-01-03: qty 10

## 2016-01-03 MED ORDER — LACTATED RINGERS IV SOLN
INTRAVENOUS | Status: DC | PRN
  Administered 2016-01-03: 07:00:00 via INTRAVENOUS

## 2016-01-03 MED ORDER — AMIODARONE LOAD VIA INFUSION
150.0000 mg | Freq: Once | INTRAVENOUS | Status: AC
  Administered 2016-01-03: 150 mg via INTRAVENOUS
  Filled 2016-01-03: qty 83.34

## 2016-01-03 MED ORDER — ALBUMIN HUMAN 5 % IV SOLN
INTRAVENOUS | Status: DC | PRN
  Administered 2016-01-03: 11:00:00 via INTRAVENOUS

## 2016-01-03 MED ORDER — ACETAMINOPHEN 160 MG/5ML PO SOLN
650.0000 mg | Freq: Once | ORAL | Status: AC
Start: 2016-01-03 — End: 2016-01-03

## 2016-01-03 MED ORDER — PROTAMINE SULFATE 10 MG/ML IV SOLN
INTRAVENOUS | Status: AC
  Filled 2016-01-03: qty 5

## 2016-01-03 MED ORDER — MIDAZOLAM HCL 10 MG/2ML IJ SOLN
INTRAMUSCULAR | Status: AC
  Filled 2016-01-03: qty 2

## 2016-01-03 MED ORDER — CHLORHEXIDINE GLUCONATE 0.12% ORAL RINSE (MEDLINE KIT): 15.0000 mL | Freq: Two times a day (BID) | OROMUCOSAL | Status: DC

## 2016-01-03 MED ORDER — PROPOFOL 10 MG/ML IV BOLUS
INTRAVENOUS | Status: AC
  Filled 2016-01-03: qty 20

## 2016-01-03 MED ORDER — LACTATED RINGERS IV SOLN: 500.0000 mL | Freq: Once | INTRAVENOUS | Status: DC | PRN

## 2016-01-03 MED ORDER — MAGNESIUM SULFATE 4 GM/100ML IV SOLN
4.0000 g | Freq: Once | INTRAVENOUS | Status: AC
Start: 2016-01-03 — End: 2016-01-03
  Administered 2016-01-03: 4 g via INTRAVENOUS
  Filled 2016-01-03: qty 100

## 2016-01-03 MED ORDER — ACETAMINOPHEN 160 MG/5ML PO SOLN: 1000.0000 mg | Freq: Four times a day (QID) | ORAL | Status: DC

## 2016-01-03 MED ORDER — PROPOFOL 10 MG/ML IV BOLUS
INTRAVENOUS | Status: DC | PRN
  Administered 2016-01-03 (×2): 50 mg via INTRAVENOUS

## 2016-01-03 MED ORDER — INSULIN REGULAR BOLUS VIA INFUSION
0.0000 [IU] | Freq: Three times a day (TID) | INTRAVENOUS | Status: DC
  Administered 2016-01-04: 1 [IU] via INTRAVENOUS
  Administered 2016-01-04: 3 [IU] via INTRAVENOUS
  Filled 2016-01-03: qty 10

## 2016-01-03 MED ORDER — ORAL CARE MOUTH RINSE: 15.0000 mL | Freq: Two times a day (BID) | OROMUCOSAL | Status: DC

## 2016-01-03 MED ORDER — BISACODYL 5 MG PO TBEC
10.0000 mg | DELAYED_RELEASE_TABLET | Freq: Every day | ORAL | Status: DC
  Administered 2016-01-04 – 2016-01-05 (×2): 10 mg via ORAL
  Filled 2016-01-03 (×2): qty 2

## 2016-01-03 MED ORDER — ARTIFICIAL TEARS OP OINT
TOPICAL_OINTMENT | OPHTHALMIC | Status: AC
  Filled 2016-01-03: qty 3.5

## 2016-01-03 MED ORDER — THROMBIN 20000 UNITS EX SOLR
OROMUCOSAL | Status: DC | PRN
  Administered 2016-01-03 (×3): 1 mL via TOPICAL

## 2016-01-03 MED ORDER — SODIUM CHLORIDE 0.9 % IV SOLN
INTRAVENOUS | Status: DC
  Administered 2016-01-03: 20 mL/h via INTRAVENOUS
  Administered 2016-01-04: 13:00:00 via INTRAVENOUS

## 2016-01-03 MED ORDER — ARTIFICIAL TEARS OP OINT
TOPICAL_OINTMENT | OPHTHALMIC | Status: DC | PRN
  Administered 2016-01-03: 1 via OPHTHALMIC

## 2016-01-03 MED ORDER — ASPIRIN EC 325 MG PO TBEC: 325.0000 mg | DELAYED_RELEASE_TABLET | Freq: Every day | ORAL | Status: DC

## 2016-01-03 MED ORDER — 0.9 % SODIUM CHLORIDE (POUR BTL) OPTIME
TOPICAL | Status: DC | PRN
  Administered 2016-01-03: 1000 mL

## 2016-01-03 MED ORDER — AMIODARONE HCL IN DEXTROSE 360-4.14 MG/200ML-% IV SOLN
INTRAVENOUS | Status: AC
  Administered 2016-01-03: 60 mg/h
  Filled 2016-01-03: qty 200

## 2016-01-03 MED ORDER — PHENYLEPHRINE HCL 10 MG/ML IJ SOLN
0.0000 ug/min | INTRAVENOUS | Status: DC
  Administered 2016-01-03: 30 ug/min via INTRAVENOUS
  Administered 2016-01-04: 45 ug/min via INTRAVENOUS
  Filled 2016-01-03 (×3): qty 2

## 2016-01-03 MED ORDER — METOPROLOL TARTRATE 12.5 MG HALF TABLET
12.5000 mg | ORAL_TABLET | Freq: Two times a day (BID) | ORAL | Status: DC
  Administered 2016-01-04 – 2016-01-05 (×2): 12.5 mg via ORAL
  Filled 2016-01-03 (×2): qty 1

## 2016-01-03 MED ORDER — ASPIRIN 81 MG PO CHEW: 324.0000 mg | CHEWABLE_TABLET | Freq: Every day | ORAL | Status: DC

## 2016-01-03 MED ORDER — ONDANSETRON HCL 4 MG/2ML IJ SOLN
4.0000 mg | Freq: Four times a day (QID) | INTRAMUSCULAR | Status: DC | PRN
  Administered 2016-01-03 – 2016-01-04 (×4): 4 mg via INTRAVENOUS
  Filled 2016-01-03 (×4): qty 2

## 2016-01-03 MED ORDER — SODIUM CHLORIDE 0.9 % IV SOLN
250.0000 mL | INTRAVENOUS | Status: DC
  Administered 2016-01-04: 250 mL via INTRAVENOUS

## 2016-01-03 MED ORDER — BISACODYL 10 MG RE SUPP: 10.0000 mg | Freq: Every day | RECTAL | Status: DC

## 2016-01-03 MED ORDER — DEXTROSE 5 % IV SOLN
1.5000 g | Freq: Two times a day (BID) | INTRAVENOUS | Status: AC
  Administered 2016-01-03 – 2016-01-05 (×4): 1.5 g via INTRAVENOUS
  Filled 2016-01-03 (×4): qty 1.5

## 2016-01-03 MED ORDER — AMIODARONE HCL IN DEXTROSE 360-4.14 MG/200ML-% IV SOLN
60.0000 mg/h | INTRAVENOUS | Status: DC
  Filled 2016-01-03: qty 200

## 2016-01-03 MED ORDER — PANTOPRAZOLE SODIUM 40 MG PO TBEC
40.0000 mg | DELAYED_RELEASE_TABLET | Freq: Every day | ORAL | Status: DC
  Administered 2016-01-05: 40 mg via ORAL
  Filled 2016-01-03: qty 1

## 2016-01-03 MED ORDER — POTASSIUM CHLORIDE 10 MEQ/50ML IV SOLN
10.0000 meq | INTRAVENOUS | Status: AC
  Administered 2016-01-03 (×3): 10 meq via INTRAVENOUS

## 2016-01-03 MED ORDER — ORAL CARE MOUTH RINSE
15.0000 mL | Freq: Four times a day (QID) | OROMUCOSAL | Status: DC
  Administered 2016-01-03: 15 mL via OROMUCOSAL

## 2016-01-03 MED ORDER — VANCOMYCIN HCL IN DEXTROSE 1-5 GM/200ML-% IV SOLN
1000.0000 mg | Freq: Once | INTRAVENOUS | Status: AC
  Administered 2016-01-03: 1000 mg via INTRAVENOUS
  Filled 2016-01-03: qty 200

## 2016-01-03 MED ORDER — SODIUM CHLORIDE 0.9% FLUSH
3.0000 mL | Freq: Two times a day (BID) | INTRAVENOUS | Status: DC
  Administered 2016-01-04 – 2016-01-05 (×2): 3 mL via INTRAVENOUS

## 2016-01-03 MED ORDER — SODIUM CHLORIDE 0.45 % IV SOLN
INTRAVENOUS | Status: DC | PRN
  Administered 2016-01-03: 20 mL/h via INTRAVENOUS

## 2016-01-03 MED ORDER — NITROGLYCERIN IN D5W 200-5 MCG/ML-% IV SOLN: 0.0000 ug/min | INTRAVENOUS | Status: DC

## 2016-01-03 MED ORDER — HEMOSTATIC AGENTS (NO CHARGE) OPTIME
TOPICAL | Status: DC | PRN
  Administered 2016-01-03: 1 via TOPICAL

## 2016-01-03 MED ORDER — THROMBIN 20000 UNITS EX SOLR
CUTANEOUS | Status: AC
  Filled 2016-01-03: qty 20000

## 2016-01-03 MED ORDER — DOCUSATE SODIUM 100 MG PO CAPS
200.0000 mg | ORAL_CAPSULE | Freq: Every day | ORAL | Status: DC
  Administered 2016-01-04 – 2016-01-05 (×2): 200 mg via ORAL
  Filled 2016-01-03: qty 2

## 2016-01-03 MED FILL — Mannitol IV Soln 20%: INTRAVENOUS | Qty: 500 | Status: AC

## 2016-01-03 MED FILL — Electrolyte-R (PH 7.4) Solution: INTRAVENOUS | Qty: 4000 | Status: AC

## 2016-01-03 MED FILL — Lidocaine HCl IV Inj 20 MG/ML: INTRAVENOUS | Qty: 5 | Status: AC

## 2016-01-03 MED FILL — Potassium Chloride Inj 2 mEq/ML: INTRAVENOUS | Qty: 40 | Status: AC

## 2016-01-03 MED FILL — Magnesium Sulfate Inj 50%: INTRAMUSCULAR | Qty: 10 | Status: AC

## 2016-01-03 MED FILL — Sodium Bicarbonate IV Soln 8.4%: INTRAVENOUS | Qty: 50 | Status: AC

## 2016-01-03 MED FILL — Heparin Sodium (Porcine) Inj 1000 Unit/ML: INTRAMUSCULAR | Qty: 30 | Status: AC

## 2016-01-03 MED FILL — Heparin Sodium (Porcine) Inj 1000 Unit/ML: INTRAMUSCULAR | Qty: 10 | Status: AC

## 2016-01-03 MED FILL — Sodium Chloride IV Soln 0.9%: INTRAVENOUS | Qty: 2000 | Status: AC

## 2016-01-03 SURGICAL SUPPLY — 77 items
ADAPTER CARDIO PERF ANTE/RETRO (ADAPTER) ×4 IMPLANT
ADPR PRFSN 84XANTGRD RTRGD (ADAPTER) ×2
APPLICATOR COTTON TIP 6IN STRL (MISCELLANEOUS) ×2 IMPLANT
BAG DECANTER FOR FLEXI CONT (MISCELLANEOUS) ×4 IMPLANT
BLADE STERNUM SYSTEM 6 (BLADE) ×4 IMPLANT
BLADE SURG 15 STRL LF DISP TIS (BLADE) ×2 IMPLANT
BLADE SURG 15 STRL SS (BLADE) ×8
CANISTER SUCTION 2500CC (MISCELLANEOUS) ×4 IMPLANT
CANNULA GUNDRY RCSP 15FR (MISCELLANEOUS) ×4 IMPLANT
CATH ROBINSON RED A/P 18FR (CATHETERS) ×14 IMPLANT
CATH THORACIC 36FR (CATHETERS) ×4 IMPLANT
CATH THORACIC 36FR RT ANG (CATHETERS) ×4 IMPLANT
CONT SPEC 4OZ CLIKSEAL STRL BL (MISCELLANEOUS) ×2 IMPLANT
CONT SPEC STER OR (MISCELLANEOUS) ×4 IMPLANT
COVER SURGICAL LIGHT HANDLE (MISCELLANEOUS) ×4 IMPLANT
CRADLE DONUT ADULT HEAD (MISCELLANEOUS) ×4 IMPLANT
DRAPE SLUSH/WARMER DISC (DRAPES) ×4 IMPLANT
DRSG COVADERM 4X14 (GAUZE/BANDAGES/DRESSINGS) ×4 IMPLANT
ELECT CAUTERY BLADE 6.4 (BLADE) ×4 IMPLANT
ELECT REM PT RETURN 9FT ADLT (ELECTROSURGICAL) ×8
ELECTRODE REM PT RTRN 9FT ADLT (ELECTROSURGICAL) ×4 IMPLANT
FELT TEFLON 1X6 (MISCELLANEOUS) ×8 IMPLANT
GAUZE SPONGE 4X4 12PLY STRL (GAUZE/BANDAGES/DRESSINGS) ×4 IMPLANT
GLOVE BIO SURGEON STRL SZ 6 (GLOVE) IMPLANT
GLOVE BIO SURGEON STRL SZ 6.5 (GLOVE) IMPLANT
GLOVE BIO SURGEON STRL SZ7 (GLOVE) IMPLANT
GLOVE BIO SURGEON STRL SZ7.5 (GLOVE) IMPLANT
GLOVE BIO SURGEONS STRL SZ 6.5 (GLOVE)
GLOVE BIOGEL M STRL SZ7.5 (GLOVE) ×2 IMPLANT
GLOVE BIOGEL PI IND STRL 6 (GLOVE) IMPLANT
GLOVE BIOGEL PI IND STRL 7.0 (GLOVE) IMPLANT
GLOVE BIOGEL PI INDICATOR 6 (GLOVE) ×8
GLOVE BIOGEL PI INDICATOR 7.0 (GLOVE) ×6
GLOVE EUDERMIC 7 POWDERFREE (GLOVE) ×8 IMPLANT
GOWN STRL REUS W/ TWL LRG LVL3 (GOWN DISPOSABLE) ×8 IMPLANT
GOWN STRL REUS W/ TWL XL LVL3 (GOWN DISPOSABLE) ×2 IMPLANT
GOWN STRL REUS W/TWL LRG LVL3 (GOWN DISPOSABLE) ×28
GOWN STRL REUS W/TWL XL LVL3 (GOWN DISPOSABLE) ×4
HEART VENT LT CURVED (MISCELLANEOUS) ×4 IMPLANT
HEMOSTAT POWDER SURGIFOAM 1G (HEMOSTASIS) ×12 IMPLANT
HEMOSTAT SURGICEL 2X14 (HEMOSTASIS) ×4 IMPLANT
KIT BASIN OR (CUSTOM PROCEDURE TRAY) ×4 IMPLANT
KIT CATH CPB BARTLE (MISCELLANEOUS) ×4 IMPLANT
KIT ROOM TURNOVER OR (KITS) ×4 IMPLANT
KIT SUCTION CATH 14FR (SUCTIONS) ×4 IMPLANT
LINE VENT (MISCELLANEOUS) ×2 IMPLANT
NS IRRIG 1000ML POUR BTL (IV SOLUTION) ×24 IMPLANT
PACK OPEN HEART (CUSTOM PROCEDURE TRAY) ×4 IMPLANT
PAD ARMBOARD 7.5X6 YLW CONV (MISCELLANEOUS) ×8 IMPLANT
SET CARDIOPLEGIA MPS 5001102 (MISCELLANEOUS) ×2 IMPLANT
SPONGE GAUZE 4X4 12PLY STER LF (GAUZE/BANDAGES/DRESSINGS) ×2 IMPLANT
SUT BONE WAX W31G (SUTURE) ×4 IMPLANT
SUT ETHIBON 2 0 V 52N 30 (SUTURE) ×8 IMPLANT
SUT ETHIBON EXCEL 2-0 V-5 (SUTURE) ×2 IMPLANT
SUT ETHIBOND 2 0 SH (SUTURE) ×4
SUT ETHIBOND 2 0 SH 36X2 (SUTURE) IMPLANT
SUT ETHIBOND V-5 VALVE (SUTURE) ×2 IMPLANT
SUT PROLENE 3 0 SH DA (SUTURE) IMPLANT
SUT PROLENE 3 0 SH1 36 (SUTURE) ×4 IMPLANT
SUT PROLENE 4 0 RB 1 (SUTURE) ×12
SUT PROLENE 4-0 RB1 .5 CRCL 36 (SUTURE) ×6 IMPLANT
SUT SILK 2 0 SH CR/8 (SUTURE) ×2 IMPLANT
SUT STEEL 6MS V (SUTURE) IMPLANT
SUT STEEL SZ 6 DBL 3X14 BALL (SUTURE) ×6 IMPLANT
SUT VIC AB 1 CTX 36 (SUTURE) ×8
SUT VIC AB 1 CTX36XBRD ANBCTR (SUTURE) ×4 IMPLANT
SUT VIC AB 2-0 CTX 27 (SUTURE) ×4 IMPLANT
SUT VIC AB 3-0 X1 27 (SUTURE) ×4 IMPLANT
SUTURE E-PAK OPEN HEART (SUTURE) ×4 IMPLANT
SYSTEM SAHARA CHEST DRAIN ATS (WOUND CARE) ×4 IMPLANT
TAPE CLOTH SURG 4X10 WHT LF (GAUZE/BANDAGES/DRESSINGS) ×2 IMPLANT
TOWEL OR 17X24 6PK STRL BLUE (TOWEL DISPOSABLE) ×8 IMPLANT
TOWEL OR 17X26 10 PK STRL BLUE (TOWEL DISPOSABLE) ×8 IMPLANT
TRAY FOLEY IC TEMP SENS 16FR (CATHETERS) ×4 IMPLANT
UNDERPAD 30X30 (UNDERPADS AND DIAPERS) ×4 IMPLANT
VALVE REGENT 21MM (Valve) ×2 IMPLANT
WATER STERILE IRR 1000ML POUR (IV SOLUTION) ×8 IMPLANT

## 2016-01-03 NOTE — Anesthesia Preprocedure Evaluation (Addendum)
Anesthesia Evaluation  Patient identified by MRN, date of birth, ID band Patient awake    Reviewed: Allergy & Precautions, NPO status , Patient's Chart, lab work & pertinent test results  Airway Mallampati: I  TM Distance: >3 FB Neck ROM: Full    Dental  (+) Teeth Intact, Dental Advisory Given,    Pulmonary shortness of breath (stops to rest 1 flight of stairs) and with exertion,    breath sounds clear to auscultation       Cardiovascular hypertension, Pt. on medications  Rhythm:Regular Rate:Normal     Neuro/Psych    GI/Hepatic negative GI ROS, Neg liver ROS,   Endo/Other  diabetes (took 12 units Levimir insulin last night), Type 2  Renal/GU Renal disease     Musculoskeletal   Abdominal   Peds  Hematology   Anesthesia Other Findings   Reproductive/Obstetrics                            Anesthesia Physical Anesthesia Plan  ASA: III  Anesthesia Plan: General   Post-op Pain Management:    Induction: Intravenous  Airway Management Planned: Oral ETT  Additional Equipment:   Intra-op Plan:   Post-operative Plan: Post-operative intubation/ventilation  Informed Consent: I have reviewed the patients History and Physical, chart, labs and discussed the procedure including the risks, benefits and alternatives for the proposed anesthesia with the patient or authorized representative who has indicated his/her understanding and acceptance.   Dental advisory given  Plan Discussed with: CRNA and Anesthesiologist  Anesthesia Plan Comments:         Anesthesia Quick Evaluation

## 2016-01-03 NOTE — Brief Op Note (Signed)
01/03/2016  10:45 AM      [address]       [place] [address]             [phone]     01/03/2016  10:45 AM  PATIENT:  [name]  60 y.o. female  PRE-OPERATIVE DIAGNOSIS:  SEVERE AS  POST-OPERATIVE DIAGNOSIS:  SEVERE AS  PROCEDURE:  Procedure(s): AORTIC VALVE REPLACEMENT (AVR) TRANSESOPHAGEAL ECHOCARDIOGRAM (TEE)  SURGEON:  Surgeon(s): Gaye Pollack, MD  PHYSICIAN ASSISTANT: Brettany Sydney PA-C  ANESTHESIA:   general  PATIENT CONDITION:  ICU - intubated and hemodynamically stable.  PRE-OPERATIVE WEIGHT: [id number]  COMPLICATIONS: NO KNOWN

## 2016-01-03 NOTE — Progress Notes (Signed)
Patient ID: [name], female   DOB: August 30, 1955, 60 y.o.   MRN: [id number] EVENING ROUNDS NOTE :     [address]       [place] [address]             [phone]                 Day of Surgery Procedure(s) (LRB): AORTIC VALVE REPLACEMENT (AVR) (N/A) TRANSESOPHAGEAL ECHOCARDIOGRAM (TEE) (N/A)  Total Length of Stay:  LOS: 0 days  BP (!) 104/51   Pulse 80   Temp 98.6 F (37 C)   Resp 18   Ht 5\' 6"  (1.676 m)   Wt 226 lb (102.5 kg)   SpO2 100%   BMI 36.48 kg/m   .Intake/Output      10/31 0701 - 11/01 0700   I.V. (mL/kg) 2894.8 (28.2)   Blood 300   NG/GT 30   IV Piggyback 1000   Total Intake(mL/kg) 4224.8 (41.2)   Urine (mL/kg/hr) 1495 (1.2)   Emesis/NG output 200 (0.2)   Blood 900 (0.7)   Chest Tube 225 (0.2)   Total Output 2820   Net +1404.8         . sodium chloride 20 mL/hr at 01/03/16 1900  . [START ON 01/04/2016] sodium chloride    . sodium chloride 20 mL/hr (01/03/16 1244)  . amiodarone 30 mg/hr (01/03/16 1900)  . dexmedetomidine Stopped (01/03/16 1430)  . insulin (NOVOLIN-R) infusion 2.7 Units/hr (01/03/16 1836)  . lactated ringers 20 mL/hr (01/03/16 1246)  . lactated ringers 20 mL/hr (01/03/16 1215)  . nitroGLYCERIN    . phenylephrine (NEO-SYNEPHRINE) Adult infusion 30 mcg/min (01/03/16 1900)     Lab Results  Component Value Date   WBC 13.4 (H) 01/03/2016   HGB 9.9 (L) 01/03/2016   HCT 29.0 (L) 01/03/2016   PLT 231 01/03/2016   GLUCOSE 127 (H) 01/03/2016   CHOL 141 08/30/2015   TRIG 167 (H) 08/30/2015   HDL 61 08/30/2015   LDLCALC 47 08/30/2015   ALT 14 12/30/2015   AST 18 12/30/2015   NA 139 01/03/2016   K 4.6 01/03/2016   CL 108 01/03/2016   CREATININE 0.80 01/03/2016   BUN 14 01/03/2016   CO2 22 12/30/2015   TSH 1.958 09/14/2013   INR 1.27 01/03/2016   HGBA1C 7.8 (H) 12/30/2015   MICROALBUR 0.50 09/14/2013   Just extubated, awake and neuro intact Not bleeding  Grace Isaac MD  Beeper [phone] Office  [phone] 01/03/2016 7:26 PM

## 2016-01-03 NOTE — Anesthesia Procedure Notes (Signed)
Central Venous Catheter Insertion Performed by: anesthesiologist Patient location: Pre-op. Preanesthetic checklist: patient identified, IV checked, site marked, risks and benefits discussed, surgical consent, monitors and equipment checked, pre-op evaluation, timeout performed and anesthesia consent Position: Trendelenburg Landmarks identified Catheter size: 8.5 Fr PA cath was placed.Swan type and PA catheter depth:thermodilationProcedure performed using ultrasound guided technique. Attempts: 1 Following insertion, line sutured, dressing applied and Biopatch. Patient tolerated the procedure well with no immediate complications.

## 2016-01-03 NOTE — OR Nursing (Signed)
45 minutes SICU call @1105  20 minutes SICU call @1129 

## 2016-01-03 NOTE — Op Note (Signed)
CARDIOVASCULAR SURGERY OPERATIVE NOTE  01/03/2016 JUSTINA DAFFRON AC:[id number]  Surgeon:  Gaye Pollack, MD  First Assistant: Jadene Pierini,  PA-C   Preoperative Diagnosis:  Bicuspid aortic valve with severe stenosis   Postoperative Diagnosis:  Same   Procedure:  1. Median Sternotomy 2. Extracorporeal circulation 3.   Aortic valve replacement using a 21 mm  St. Jude Regent mechanical valve.  Anesthesia:  General Endotracheal   Clinical History/Surgical Indication:  The patient is a 60 year old woman with hypertension, hyperlipidemia, and type 2 DM who has a history of severe aortic stenosis. She had an echo in 09/2012 that showed a mean AV gradient of 48 mm Hg with a thickened and calcified aortic valve. By report this was a significant increase in gradient from her prior echo in 2012. Her most recent echo on 11/09/2015 was a better study and showed a bicuspid aortic valve with moderately calcified leaflets and reduced leaflet separation with an increase in the mean gradient to 52 mm Hg with a peak of 88 mm Hg. LVEF was 65-70% with grade 1 diastolic dysfunction and moderate LVH. There was no significant MR. The aortic root was normal in size. She had a CTA of the chest on 09/12/2012 that showed a mildly enlarged ascending aorta. The aortic valve was calcified and thickened. There was atherosclerotic calcification in the LAD. She had a repeat CT scan of the chest without contrast due to mild CKD on 12/29/2015 showing fusiform enlargement of the ascending aorta to 4.1 x 4.0 cm at the level of the PA. I have compared this to her scan in 2014 and I do not see any significant difference by my measurement.  Since she felt well she underwent an ETT on 12/01/2015 which was early positive with abnormal ST depression inferiorly and laterally in late stage 1 with impaired exercise capacity. The test was stopped due to fatigue and shortness of breath.    Her cardiac cath shows mild irregularity in the  proximal to mid LAD without significant obstruction. Right      heart pressures were normal with a CI of 2.57.  The mean AV gradient was 53.8 mm Hg.  This 60 year old woman has stage D severe, symptomatic aortic stenosis with an early positive exercise treadmill test. I have personally reviewed and interpreted her recent echocardiogram and her CTA from 2014 and CT of the chest from 12/29/2015. She has a bicuspid aortic valve with calcified leaflets with decreased mobility and separation and a mean gradient that has increased to 52 mm Hg. I think aortic valve replacement is indicated for this patient. Her CTA in 2014 showed mild enlargement of the ascending aorta to 3.8 cm with a descending aorta of 2.2 cm. I don't think this has significantly changed on her recent CT and is still below the 4.5 cm threshold for replacing her aorta with a bicuspid aortic valve. She understands that there is a risk of further enlargement of her aorta or aortic dissection but that risk is very low and the risk of replacing her aorta at this time is higher. Her cath shows insignificant coronary disease.  I discussed the options for valve prosthesis with her and her husband including mechanical and tissue valves. We discussed the need for lifelong anticoagulation using coumadin for a mechanical valve, the needed follow up of the INR and the 1-2% per year risk of thromboembolism and bleeding. We also discussed the risk of structural valve deterioration using a bovine pericardial valve  in her age group. I think the risk is still relatively low and if it occurs the valve can be replaced with a transcatheter valve as long as the initial pericardial valve is of adequate size, usually 23 mm or greater. She would rather avoid Coumadin if possible and would like to use a pericardial valve if possible. I told her that I would try to do that as long as I can insert a large enough valve. Otherwise a mechanical valve is a better choice. She does  not have any contraindication to long-term anticoagulation. I discussed the operative procedure with the patient and her husbandincluding alternatives, benefits and risks; including but not limited to bleeding, blood transfusion, infection, stroke, myocardial infarction,heart block requiring a permanent pacemaker, organ dysfunction, and death. Kenady R Hudsonunderstands and agrees to proceed.      Preparation:  The patient was seen in the preoperative holding area and the correct patient, correct operation were confirmed with the patient after reviewing the medical record and catheterization. The consent was signed by me. Preoperative antibiotics were given. A pulmonary arterial line and radial arterial line were placed by the anesthesia team. The patient was taken back to the operating room and positioned supine on the operating room table. After being placed under general endotracheal anesthesia by the anesthesia team a foley catheter was placed. The neck, chest, abdomen, and both legs were prepped with betadine soap and solution and draped in the usual sterile manner. A surgical time-out was taken and the correct patient and operative procedure were confirmed with the nursing and anesthesia staff.   Pre-bypass TEE:   Complete TEE assessment was performed by Dr. Finis Bud. This showed a bicuspid aortic valve with severe stenosis, normal LV function    Post-bypass TEE:   Normal functioning prosthetic aortic valve with no perivalvular leak or regurgitation through the valve. Left ventricular function preserved. No mitral regurgitation.    Cardiopulmonary Bypass:  A median sternotomy was performed. The pericardium was opened in the midline. Right ventricular function appeared normal. The ascending aorta looked grosslynormal sized although there was some enlargement seen of preop CT and had no palpable plaque. There were no contraindications to aortic cannulation or  cross-clamping. The patient was fully systemically heparinized and the ACT was maintained > 400 sec. The proximal aortic arch was cannulated with a 20 F aortic cannula for arterial inflow. Venous cannulation was performed via the right atrial appendage using a two-staged venous cannula. An antegrade cardioplegia/vent cannula was inserted into the mid-ascending aorta. A left ventricular vent was placed via the right superior pulmonary vein. A retrograde cardioplegia cannnula was placed into the coronary sinus via the right atrium. Aortic occlusion was performed with a single cross-clamp. Systemic cooling to 32 degrees Centigrade and topical cooling of the heart with iced saline were used. Hyperkalemic antegrade cold blood cardioplegia was used to induce diastolic arrest and then cold blood retrograde cardioplegia was given at about 20 minute intervals throughout the period of arrest to maintain myocardial temperature at or below 10 degrees centigrade. A temperature probe was inserted into the interventricular septum and an insulating pad was placed in the pericardium. Carbon dioxide was insufflated into the pericardium at 5L/min throughout the procedure to minimize intracardiac air.   Aortic Valve Replacement:   A transverse aortotomy was performed 1 cm above the take-off of the right coronary artery. The native valve was bicuspid with markedly thickened andcalcified leaflets and mild annular calcification. There were 3 commissures with fusion of the  left and right cusps. The ostia of the coronary arteries were in normal position and were not obstructed. The native valve leaflets were excised and the annulus was decalcified with rongeurs. Care was taken to remove all particulate debris. The left ventricle was directly inspected for debris and then irrigated with ice saline solution. The annulus was sized and a size 21 mm St. Jude Regent mechanical valve was chosen. The model number was [id number] and the serial  number was [id number]. Her annulus was only large enough to insert a 21 mm pericardial Magna-Ease valve and I did not feel that this was adequate for her. If the valve deteriorated at her age she would require a redo valve replacement because the valve would be too small to insert an adequate sized transcatheter valve. Her BSA was 2.2. While the valve was being prepared 2-0 Ethibond pledgeted horizontal mattress sutures were placed around the annulus with the pledgets in a sub-annular position. The sutures were placed through the sewing ring and the valve lowered into place. The sutures were tied sequentially. The valve seated nicely and the coronary ostia were not obstructed. The prosthetic valve leaflets moved normally and there was no sub-valvular obstruction. The aortotomy was closed using 4-0 Prolene suture in 2 layers with felt strips to reinforce the closure.  Completion:  The patient was rewarmed to 37 degrees Centigrade. De-airing maneuvers were performed and the head placed in trendelenburg position. The crossclamp was removed with a time of 89 minutes. There was spontaneous return of sinus rhythm. The aortotomy was checked for hemostasis. Two temporary epicardial pacing wires were placed on the right atrium and two on the right ventricle. The left ventricular vent and retrograde cardioplegia cannulas were removed. The patient was weaned from CPB without difficulty on no inotropes. CPB time was 115 minutes. Cardiac output was 4.5 LPM. Heparin was fully reversed with protamine and the aortic and venous cannulas removed. Hemostasis was achieved. Mediastinal drainage tubes were placed. The sternum was closed with double #6 stainless steel wires. The fascia was closed with continuous # 1 vicryl suture. The subcutaneous tissue was closed with 2-0 vicryl continuous suture. The skin was closed with 3-0 vicryl subcuticular suture. All sponge, needle, and instrument counts were reported correct at the end of the  case. Dry sterile dressings were placed over the incisions and around the chest tubes which were connected to pleurevac suction. The patient was then transported to the surgical intensive care unit in critical but stable condition.

## 2016-01-03 NOTE — Transfer of Care (Signed)
Immediate Anesthesia Transfer of Care Note  Patient: [name]  Procedure(s) Performed: Procedure(s): AORTIC VALVE REPLACEMENT (AVR) (N/A) TRANSESOPHAGEAL ECHOCARDIOGRAM (TEE) (N/A)  Patient Location: SICU  Anesthesia Type:General  Level of Consciousness: sedated and unresponsive  Airway & Oxygen Therapy: Patient remains intubated per anesthesia plan and Patient placed on Ventilator (see vital sign flow sheet for setting)  Post-op Assessment: Report given to RN and Post -op Vital signs reviewed and stable  Post vital signs: Reviewed and stable  Last Vitals:  Vitals:   01/03/16 0559 01/03/16 0627  BP: (!) 146/91   Pulse: 88 81  Resp:  18  Temp:  36.6 C    Last Pain:  Vitals:   01/03/16 0627  TempSrc: Oral      Patients Stated Pain Goal: 2 ([id number])  Complications: No apparent anesthesia complications

## 2016-01-03 NOTE — H&P (Signed)
[address]       [place] [address]             [phone]      Cardiothoracic Surgery History and Physical   PCP is Sallee Lange, MD Referring Provider is Satira Sark, MD      Chief Complaint  Patient presents with  . Aortic Stenosis    ECHO 11/09/15. EXERCISE TOL. TEST 12/01/15    HPI:  The patient is a 60 year old woman with hypertension, hyperlipidemia, and type 2 DM who has a history of severe aortic stenosis. She had an echo in 09/2012 that showed a mean AV gradient of 48 mm Hg with a thickened and calcified aortic valve. By report this was a significant increase in gradient from her prior echo in 2012. Her most recent echo on 11/09/2015 was a better study and showed a bicuspid aortic valve with moderately calcified leaflets and reduced leaflet separation with an increase in the mean gradient to 52 mm Hg with a peak of 88 mm Hg. LVEF was 65-70% with grade 1 diastolic dysfunction and moderate LVH. There was no significant MR. The aortic root was normal in size. She had a CTA of the chest on 09/12/2012 that showed a mildly enlarged ascending aorta. The aortic valve was calcified and thickened. There was atherosclerotic calcification in the LAD. She had a repeat CT scan of the chest without contrast due to mild CKD on 12/29/2015 showing fusiform enlargement of the ascending aorta to 4.1 x 4.0 cm at the level of the PA. I have compared this to her scan in 2014 and I do not see any significant difference by my measurement.  Since she felt well she underwent an ETT on 12/01/2015 which was early positive with abnormal ST depression inferiorly and laterally in late stage 1 with impaired exercise capacity. The test was stopped due to fatigue and shortness of breath.    Her cardiac cath shows mild irregularity in the proximal to mid LAD without     significant obstruction. Right heart pressures were normal with a CI of 2.57.  The mean AV gradient was 53.8 mm  Hg.  She reports feeling well overall. She does admit to some shortness of breath with heavy exertion or going up steps. 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 She denies chest pain or dizziness.       Past Medical History:  Diagnosis Date  . Aortic stenosis    Mild 5/12  . Essential hypertension, benign   . Mixed hyperlipidemia   . Restless leg syndrome   . Ruptured disk   . Type 2 diabetes mellitus ([place])          Past Surgical History:  Procedure Laterality Date  . COLONOSCOPY            Family History  Problem Relation Age of Onset  . Coronary artery disease Father     Diagnosed in his 71s  . Hypertension Father   . Diabetes type II Mother   . Hypertension Mother   . Diabetes Mother   . Diabetes type II Brother   . Diabetes Brother   . Hypertension Sister   . Hyperlipidemia Sister     Social History     Social History  Substance Use Topics  . Smoking status: Never Smoker  .  Smokeless tobacco: Never Used  . Alcohol use No          Current Outpatient Prescriptions  Medication Sig Dispense Refill  . ACCU-CHEK AVIVA PLUS test strip TEST DAILY AS DIRECTED. 50 each 5  . ACCU-CHEK FASTCLIX LANCETS MISC USE DAILY AS DIRECTED. 102 each 5  . amLODipine (NORVASC) 2.5 MG tablet Take 1 tablet (2.5 mg total) by mouth daily. 30 tablet 5  . aspirin 81 MG tablet Take 81 mg by mouth daily.    . B-D UF III MINI PEN NEEDLES 31G X 5 MM MISC USE AS DIRECTED. 100 each 5  . empagliflozin (JARDIANCE) 10 MG TABS tablet Take 10 mg by mouth daily. 30 tablet 5  . glyBURIDE (DIABETA) 5 MG tablet TAKE 1 tablet by mouth every morning and 1 tablet with supper. 60 tablet 5  . insulin detemir (LEVEMIR) 100 UNIT/ML injection Inject 0.24 mLs (24 Units total) into the skin at bedtime. 10 mL 5  . lisinopril (PRINIVIL,ZESTRIL) 20 MG tablet Take 1 tablet (20 mg total) by mouth  daily. 30 tablet 5  . metFORMIN (GLUCOPHAGE) 1000 MG tablet TAKE (1) TABLET BY MOUTH TWICE DAILY. 60 tablet 5  . pravastatin (PRAVACHOL) 20 MG tablet TAKE 1 TABLET BY MOUTH AT BEDTIME FOR CHOLESTEROL. 30 tablet 5  . traZODone (DESYREL) 100 MG tablet Take 1/2 to 1 tablet as needed for bedtime. 30 tablet 5   No current facility-administered medications for this visit.     No Known Allergies  Review of Systems  Constitutional: Positive for fatigue. Negative for activity change, appetite change and unexpected weight change.  HENT:       Has not seen her dentist in several years and thinks she may have a problem with a filling.  Eyes: Negative.   Respiratory: Positive for shortness of breath. Negative for chest tightness.        With exertion  Cardiovascular: Negative for chest pain, palpitations and leg swelling.  Gastrointestinal: Negative.   Endocrine: Negative.   Genitourinary: Negative.   Musculoskeletal: Negative.   Allergic/Immunologic: Negative.   Neurological: Negative for dizziness, syncope and light-headedness.  Hematological: Negative.   Psychiatric/Behavioral: Negative.     BP (!) 170/84   Pulse 94   Resp 16   Ht 5\' 6"  (1.676 m)   Wt 232 lb (105.2 kg)   SpO2 98% Comment: ON RA  BMI 37.45 kg/m  Physical Exam  Constitutional: She is oriented to person, place, and time.  Obese woman in no distress  BSA 2.24 m2  HENT:  Head: Normocephalic and atraumatic.  Mouth/Throat: Oropharynx is clear and moist.  Teeth look like they are in grossly fair condition  Eyes: EOM are normal. Pupils are equal, round, and reactive to light.  Neck: Normal range of motion. Neck supple. No JVD present. Thyromegaly present.  Cardiovascular: Normal rate, regular rhythm and intact distal pulses.   Murmur heard. 3/6 systolic murmur RSB  Pulmonary/Chest: Effort normal and breath sounds normal. No respiratory distress.  Abdominal: Soft. Bowel sounds are normal. She exhibits no  distension and no mass. There is no tenderness.  Musculoskeletal: Normal range of motion. She exhibits no edema.  Lymphadenopathy:    She has no cervical adenopathy.  Neurological: She is alert and oriented to person, place, and time. She has normal strength. No cranial nerve deficit or sensory deficit.  Skin: Skin is warm and dry.  Psychiatric: She has a normal mood and affect.    Diagnostic Tests:  *[facility] - Deneise Lever  [address] [place], [place] [address] [id number]  ------------------------------------------------------------------- Transthoracic Echocardiography  Patient: Julianie, Sinnett MR #: [id number] Study Date: 11/09/2015 Gender: F Age: 70 Height: 167.6 cm Weight: 103.4 kg BSA: 2.24 m^2 Pt. Status: Room:  ATTENDING Rozann Lesches, M.D. ORDERING Rozann Lesches, M.D. REFERRING Rozann Lesches, M.D. PERFORMING [facility] SONOGRAPHER Alvino Chapel, RCS  cc:  ------------------------------------------------------------------- LV EF: 65% - 70%  ------------------------------------------------------------------- Indications: Aortic stenosis 424.1.  ------------------------------------------------------------------- History: Risk factors: Hypertension. Diabetes mellitus. Dyslipidemia.  ------------------------------------------------------------------- Study Conclusions  - Left ventricle: The cavity size was normal. Wall thickness was increased in a pattern of moderate LVH. Systolic function was vigorous. The estimated ejection fraction was in the range of 65% to 70%. Wall motion was normal; there were no regional wall motion abnormalities. Doppler parameters are consistent with abnormal left ventricular relaxation (grade 1 diastolic dysfunction). - Aortic valve: Bicuspid;  moderately calcified leaflets. Cusp separation was reduced. There was severe stenosis. Mean gradient (S): 52 mm Hg. Peak gradient (S): 88 mm Hg. VTI ratio of LVOT to aortic valve: 0.26. Valve area (VTI): 0.9 cm^2. - Left atrium: The atrium was at the upper limits of normal in size. - Right atrium: Central venous pressure (est): 3 mm Hg. - Atrial septum: No defect or patent foramen ovale was identified. - Tricuspid valve: There was trivial regurgitation. - Pulmonary arteries: PA peak pressure: 27 mm Hg (S). - Pericardium, extracardiac: A prominent pericardial fat pad was present.  Impressions:  - Moderate LVH with LVEF 65-70%. Grade 1 diastolic dysfunction. Upper normal left atrial chamber size. Moderately calcified, bicuspid aortic valve with evidence of severe stenosis as outlined above. Trivial tricuspid regurgitation with PASP 27 mmHg.  ------------------------------------------------------------------- Study data: Comparison was made to the study of 09/14/2012. Study status: Routine. Procedure: The patient reported no pain pre or post test. Transthoracic echocardiography. Image quality was adequate. Study completion: There were no complications. Transthoracic echocardiography. M-mode, complete 2D, spectral Doppler, and color Doppler. Birthdate: Patient birthdate: 1955-04-24. Age: Patient is 60 yr old. Sex: Gender: female. BMI: 36.8 kg/m^2. Blood pressure: 189/96 Patient status: Inpatient. Study date: Study date: 11/09/2015. Study time: 01:08 PM. Location: Echo laboratory.  -------------------------------------------------------------------  ------------------------------------------------------------------- Left ventricle: The cavity size was normal. Wall thickness was increased in a pattern of moderate LVH. Systolic function was vigorous. The estimated ejection fraction was in the range of 65% to 70%. Wall motion was normal;  there were no regional wall motion abnormalities. Doppler parameters are consistent with abnormal left ventricular relaxation (grade 1 diastolic dysfunction).  ------------------------------------------------------------------- Aortic valve: Bicuspid; moderately calcified leaflets. Cusp separation was reduced. Doppler: There was severe stenosis. There was no significant regurgitation. VTI ratio of LVOT to aortic valve: 0.26. Valve area (VTI): 0.9 cm^2. Indexed valve area (VTI): 0.4 cm^2/m^2. Peak velocity ratio of LVOT to aortic valve: 0.21. Valve area (Vmax): 0.74 cm^2. Indexed valve area (Vmax): 0.33 cm^2/m^2. Mean velocity ratio of LVOT to aortic valve: 0.21. Valve area (Vmean): 0.74 cm^2. Indexed valve area (Vmean): 0.33 cm^2/m^2. Mean gradient (S): 52 mm Hg. Peak gradient (S): 88 mm Hg.  ------------------------------------------------------------------- Aorta: Aortic root: The aortic root was normal in size.  ------------------------------------------------------------------- Mitral valve: The valve appears to be grossly normal. Doppler: There was no significant regurgitation.  ------------------------------------------------------------------- Left atrium: The atrium was at the upper limits of normal in size.  ------------------------------------------------------------------- Atrial septum: No defect or patent foramen ovale was identified.  ------------------------------------------------------------------- Right ventricle: The cavity size was normal. Systolic function was normal.  ------------------------------------------------------------------- Pulmonic valve:  The valve appears to be grossly normal. Doppler: There was no significant regurgitation.  ------------------------------------------------------------------- Tricuspid valve: The valve appears to be grossly normal. Doppler: There was trivial  regurgitation.  ------------------------------------------------------------------- Right atrium: The atrium was normal in size.  ------------------------------------------------------------------- Pericardium: A prominent pericardial fat pad was present. There was no pericardial effusion.  ------------------------------------------------------------------- Systemic veins: Inferior vena cava: The vessel was normal in size. The respirophasic diameter changes were in the normal range (>= 50%), consistent with normal central venous pressure.  ------------------------------------------------------------------- Measurements  Left ventricle Value Reference LV ID, ED, PLAX chordal (L) 40.2 mm 43 - 52 LV ID, ES, PLAX chordal 23.5 mm 23 - 38 LV fx shortening, PLAX chordal 42 % >=29 LV PW thickness, ED 13.4 mm --------- IVS/LV PW ratio, ED 1.2 <=1.3 Stroke volume, 2D 97 ml --------- Stroke volume/bsa, 2D 43 ml/m^2 --------- LV ejection fraction, 1-p A4C 59 % --------- LV end-diastolic volume, 2-p 65 ml --------- LV end-systolic volume, 2-p 26 ml --------- LV ejection fraction, 2-p 60 % --------- Stroke volume, 2-p 39 ml --------- LV end-diastolic volume/bsa, 2-p 29 ml/m^2 --------- LV end-systolic volume/bsa, 2-p 11 ml/m^2 --------- Stroke volume/bsa, 2-p 17.3 ml/m^2 --------- LV e&', lateral 8.27 cm/s --------- LV E/e&', lateral 8.04 --------- LV e&', medial 5.66 cm/s  --------- LV E/e&', medial 11.75 --------- LV e&', average 6.97 cm/s --------- LV E/e&', average 9.55 ---------  Ventricular septum Value Reference IVS thickness, ED 16.1 mm ---------  LVOT Value Reference LVOT ID, S 21 mm --------- LVOT area 3.46 cm^2 --------- LVOT peak velocity, S 101 cm/s --------- LVOT mean velocity, S 69.9 cm/s --------- LVOT VTI, S 28 cm --------- LVOT peak gradient, S 4 mm Hg ---------  Aortic valve Value Reference Aortic valve peak velocity, S 470 cm/s --------- Aortic valve mean velocity, S 327 cm/s --------- Aortic valve VTI, S 108 cm --------- Aortic mean gradient, S 52 mm Hg --------- Aortic peak gradient, S 88 mm Hg --------- VTI ratio, LVOT/AV 0.26 --------- Aortic valve area, VTI 0.9 cm^2 --------- Aortic valve area/bsa, VTI 0.4 cm^2/m^2 --------- Velocity ratio, peak, LVOT/AV 0.21 --------- Aortic valve area, peak velocity 0.74 cm^2 --------- Aortic valve area/bsa, peak 0.33 cm^2/m^2 --------- velocity Velocity ratio, mean, LVOT/AV 0.21 --------- Aortic valve area, mean velocity 0.74 cm^2 --------- Aortic valve area/bsa, mean 0.33 cm^2/m^2 --------- velocity  Aorta  Value Reference Aortic root ID, ED 31 mm ---------  Left atrium Value Reference LA ID, A-P, ES 39 mm --------- LA ID/bsa, A-P 1.74 cm/m^2 <=2.2 LA volume, S 60.9 ml --------- LA volume/bsa, S 27.2 ml/m^2 --------- LA volume, ES, 1-p A4C 57.6 ml --------- LA volume/bsa, ES, 1-p A4C 25.7 ml/m^2 --------- LA volume, ES, 1-p A2C 57.2 ml --------- LA volume/bsa, ES, 1-p A2C 25.5 ml/m^2 ---------  Mitral valve Value Reference Mitral E-wave peak velocity 66.5 cm/s --------- Mitral A-wave peak velocity 111 cm/s --------- Mitral deceleration time (H) 563 ms 150 - 230 Mitral E/A ratio, peak 0.6 ---------  Pulmonary arteries Value Reference PA pressure, S, DP 27 mm Hg <=30  Tricuspid valve Value Reference Tricuspid regurg peak velocity 244 cm/s --------- Tricuspid peak RV-RA gradient 24 mm Hg ---------  Systemic veins Value Reference Estimated CVP 3 mm Hg ---------  Right ventricle Value Reference RV ID, ED, PLAX 27.8 mm 19 - 38 TAPSE 27.7 mm --------- RV pressure, S, DP 27 mm Hg <=30 RV s&', lateral, S 13.1 cm/s ---------  Legend: (L) and  (H) mark values outside specified reference range.  ------------------------------------------------------------------- Prepared and Electronically Authenticated  by  Rozann Lesches, M.D. 2017-09-06T15:18:28  CLINICAL DATA:  Preop for ascending aortic replacement/AVR 01/03/2016  EXAM: CT CHEST WITHOUT CONTRAST  TECHNIQUE: Multidetector CT imaging of the chest was performed following the standard protocol without IV contrast.  COMPARISON:  Chest CT 09/12/2012  FINDINGS: Cardiovascular: Fusiform enlargement of the ascending aorta with maximum measurement of 4.1 x 4.0 cm at the level of the right pulmonary artery. Dense calcification noted at the aortic valve. Minimal atherosclerotic calcifications at the aortic arch. Coronary artery calcifications are stable.  Mediastinum/Nodes: No mediastinal or hilar mass or adenopathy. Small scattered lymph nodes are stable. The esophagus is grossly normal.  Lungs/Pleura: No significant pulmonary findings. No acute pulmonary abnormality. No worrisome pulmonary lesions. No pleural effusion.  Upper Abdomen: No significant upper abdominal findings.  Musculoskeletal: No significant bony findings. Suspect early changes of DISH or ankylosing spondylitis. Stable small lytic lesion in a mid thoracic vertebral body, unchanged since 2014. Advanced sternoclavicular joint degenerative changes, right greater than left.  IMPRESSION: 1. Fusiform aneurysmal dilatation of the ascending aorta with maximum measurements of 4.1 x 4.0 cm. Dense calcification noted at the aortic valve. 2. No mediastinal or hilar mass or adenopathy. 3. No significant pulmonary findings.   Electronically Signed   By: Marijo Sanes M.D.   On: 12/29/2015 12:49   Impression:  This 60 year old woman has stage D severe, symptomatic aortic stenosis with an early positive exercise treadmill test. I have personally reviewed and interpreted her recent  echocardiogram and her CTA from 2014 and CT of the chest from 12/29/2015. She has a bicuspid aortic valve with calcified leaflets with decreased mobility and separation and a mean gradient that has increased to 52 mm Hg. I think aortic valve replacement is indicated for this patient.  Her CTA in 2014 showed mild enlargement of the ascending aorta to 3.8 cm with a descending aorta of 2.2 cm. I don't think this has significantly changed on her recent CT and is still below the 4.5 cm threshold for replacing her aorta with a bicuspid aortic valve. She understands that there is a risk of further enlargement of her aorta or aortic dissection but that risk is very low and the risk of replacing her aorta at this time is higher. Her cath shows insignificant coronary disease.  The patient and her husband were counseled at length regarding treatment alternatives for management of severe symptomatic aortic stenosis. Alternative approaches such as conventional aortic valve replacement, transcatheter aortic valve replacement, and palliative medical therapy were compared and contrasted at length. I don't think she is a candidate for TAVR at 60 years old with low STS risk. The risks associated with conventional surgical aortic valve replacement were been discussed in detail, as were expectations for post-operative convalescence. Long-term prognosis with medical therapy was discussed.   I discussed the options for valve prosthesis with her and her husband including mechanical and tissue valves. We discussed the need for lifelong anticoagulation using coumadin for a mechanical valve, the needed follow up of the INR and the 1-2% per year risk of thromboembolism and bleeding. We also discussed the risk of structural valve deterioration using a bovine pericardial valve in her age group. I think the risk is still relatively low and if it occurs the valve can be replaced with a transcatheter valve as long as the initial pericardial  valve is of adequate size, usually 23 mm or greater. She would rather avoid Coumadin if possible and would like to use a pericardial valve  if possible. I told her that I would try to do that as long as I can insert a large enough valve. Otherwise a mechanical valve is a better choice. She does not have any contraindication to long-term anticoagulation. I discussed the operative procedure with the patient and her husband including alternatives, benefits and risks; including but not limited to bleeding, blood transfusion, infection, stroke, myocardial infarction, heart block requiring a permanent pacemaker, organ dysfunction, and death.  Lodema Pilot understands and agrees to proceed.     Plan:  AVR using a pericardial valve and possibly a mechanical valve if her annulus is too small for an adequate sized tissue valve.  Gaye Pollack, MD [facility] [phone]

## 2016-01-03 NOTE — Progress Notes (Signed)
  Echocardiogram Echocardiogram Transesophageal has been performed.  Roseanna Rainbow R 01/03/2016, 10:04 AM

## 2016-01-03 NOTE — Interval H&P Note (Signed)
History and Physical Interval Note:  01/03/2016 6:00 AM  [name]  has presented today for surgery, with the diagnosis of SEVERE AS  The various methods of treatment have been discussed with the patient and family. After consideration of risks, benefits and other options for treatment, the patient has consented to  Procedure(s): AORTIC VALVE REPLACEMENT (AVR) (N/A) TRANSESOPHAGEAL ECHOCARDIOGRAM (TEE) (N/A) as a surgical intervention .  The patient's history has been reviewed, patient examined, no change in status, stable for surgery.  I have reviewed the patient's chart and labs.  Questions were answered to the patient's satisfaction.     Gaye Pollack

## 2016-01-03 NOTE — Anesthesia Procedure Notes (Signed)
Procedure Name: Intubation Date/Time: 01/03/2016 7:58 AM Performed by: Suzy Bouchard Pre-anesthesia Checklist: Patient identified, Emergency Drugs available, Suction available, Timeout performed and Patient being monitored Patient Re-evaluated:Patient Re-evaluated prior to inductionOxygen Delivery Method: Circle system utilized Preoxygenation: Pre-oxygenation with 100% oxygen Intubation Type: IV induction Ventilation: Mask ventilation without difficulty Laryngoscope Size: Miller and 2 Grade View: Grade I Tube type: Oral Laser Tube: Cuffed inflated with minimal occlusive pressure - saline Tube size: 7.5 mm Number of attempts: 1 Airway Equipment and Method: Stylet Placement Confirmation: ETT inserted through vocal cords under direct vision,  positive ETCO2 and breath sounds checked- equal and bilateral Secured at: 23 cm Tube secured with: Tape Dental Injury: Teeth and Oropharynx as per pre-operative assessment

## 2016-01-03 NOTE — Procedures (Signed)
Extubation Procedure Note  Patient Details:   Name: [name] DOB: [birth year] MRN: [id number]   Airway Documentation:     Evaluation  O2 sats: stable throughout Complications: No apparent complications Patient did tolerate procedure well. Bilateral Breath Sounds: Clear   Yes   Pt. Was extubated to a 4L [place] without any complications, dyspnea or stridor noted. Pt. Achieved .9L on VC & -24 on NIF. Pt. Was instructed on IS x 5, highest goal reached was 631mL.  [name], Eddie North 01/03/2016, 5:05 PM

## 2016-01-04 ENCOUNTER — Inpatient Hospital Stay (HOSPITAL_COMMUNITY): Payer: BC Managed Care – PPO

## 2016-01-04 LAB — POCT I-STAT, CHEM 8
BUN: 15 mg/dL (ref 6–20)
CHLORIDE: 99 mmol/L — AB (ref 101–111)
CREATININE: 1.1 mg/dL — AB (ref 0.44–1.00)
Calcium, Ion: 1.25 mmol/L (ref 1.15–1.40)
Glucose, Bld: 237 mg/dL — ABNORMAL HIGH (ref 65–99)
HEMATOCRIT: 30 % — AB (ref 36.0–46.0)
Hemoglobin: 10.2 g/dL — ABNORMAL LOW (ref 12.0–15.0)
Potassium: 4.6 mmol/L (ref 3.5–5.1)
SODIUM: 132 mmol/L — AB (ref 135–145)
TCO2: 20 mmol/L (ref 0–100)

## 2016-01-04 LAB — CREATININE, SERUM
CREATININE: 1.17 mg/dL — AB (ref 0.44–1.00)
GFR calc Af Amer: 57 mL/min — ABNORMAL LOW (ref >60)
GFR calc non Af Amer: 49 mL/min — ABNORMAL LOW (ref >60)

## 2016-01-04 LAB — GLUCOSE, CAPILLARY
GLUCOSE-CAPILLARY: 125 mg/dL — AB (ref 65–99)
GLUCOSE-CAPILLARY: 132 mg/dL — AB (ref 65–99)
GLUCOSE-CAPILLARY: 109 mg/dL — AB (ref 65–99)
GLUCOSE-CAPILLARY: 117 mg/dL — AB (ref 65–99)
GLUCOSE-CAPILLARY: 121 mg/dL — AB (ref 65–99)
Glucose-Capillary: 120 mg/dL — ABNORMAL HIGH (ref 65–99)
Glucose-Capillary: 124 mg/dL — ABNORMAL HIGH (ref 65–99)
Glucose-Capillary: 127 mg/dL — ABNORMAL HIGH (ref 65–99)
Glucose-Capillary: 134 mg/dL — ABNORMAL HIGH (ref 65–99)
Glucose-Capillary: 146 mg/dL — ABNORMAL HIGH (ref 65–99)
Glucose-Capillary: 147 mg/dL — ABNORMAL HIGH (ref 65–99)
Glucose-Capillary: 151 mg/dL — ABNORMAL HIGH (ref 65–99)
Glucose-Capillary: 166 mg/dL — ABNORMAL HIGH (ref 65–99)
Glucose-Capillary: 201 mg/dL — ABNORMAL HIGH (ref 65–99)
Glucose-Capillary: 90 mg/dL (ref 65–99)

## 2016-01-04 LAB — CBC
HEMATOCRIT: 31.1 % — AB (ref 36.0–46.0)
HEMATOCRIT: 29.8 % — AB (ref 36.0–46.0)
HEMOGLOBIN: 10.2 g/dL — AB (ref 12.0–15.0)
HEMOGLOBIN: 9.9 g/dL — AB (ref 12.0–15.0)
MCH: 28.2 pg (ref 26.0–34.0)
MCH: 28.3 pg (ref 26.0–34.0)
MCHC: 32.8 g/dL (ref 30.0–36.0)
MCHC: 33.2 g/dL (ref 30.0–36.0)
MCV: 85.9 fL (ref 78.0–100.0)
MCV: 85.1 fL (ref 78.0–100.0)
Platelets: 272 10*3/uL (ref 150–400)
Platelets: 218 10*3/uL (ref 150–400)
RBC: 3.62 MIL/uL — ABNORMAL LOW (ref 3.87–5.11)
RBC: 3.5 MIL/uL — AB (ref 3.87–5.11)
RDW: 13.3 % (ref 11.5–15.5)
RDW: 13.4 % (ref 11.5–15.5)
WBC: 17.6 10*3/uL — AB (ref 4.0–10.5)
WBC: 16.9 10*3/uL — ABNORMAL HIGH (ref 4.0–10.5)

## 2016-01-04 LAB — BASIC METABOLIC PANEL
ANION GAP: 7 (ref 5–15)
BUN: 11 mg/dL (ref 6–20)
CALCIUM: 8.6 mg/dL — AB (ref 8.9–10.3)
CHLORIDE: 108 mmol/L (ref 101–111)
CO2: 21 mmol/L — AB (ref 22–32)
Creatinine, Ser: 0.91 mg/dL (ref 0.44–1.00)
GFR calc non Af Amer: >60 mL/min (ref >60)
GLUCOSE: 152 mg/dL — AB (ref 65–99)
POTASSIUM: 4.5 mmol/L (ref 3.5–5.1)
Sodium: 136 mmol/L (ref 135–145)

## 2016-01-04 LAB — MAGNESIUM
Magnesium: 2.3 mg/dL (ref 1.7–2.4)
Magnesium: 1.9 mg/dL (ref 1.7–2.4)

## 2016-01-04 MED ORDER — WARFARIN - PHYSICIAN DOSING INPATIENT: Freq: Every day | Status: DC

## 2016-01-04 MED ORDER — FUROSEMIDE 10 MG/ML IJ SOLN
40.0000 mg | Freq: Once | INTRAMUSCULAR | Status: AC
  Administered 2016-01-04: 40 mg via INTRAVENOUS
  Filled 2016-01-04: qty 4

## 2016-01-04 MED ORDER — INSULIN DETEMIR 100 UNIT/ML ~~LOC~~ SOLN: 20.0000 [IU] | Freq: Every day | SUBCUTANEOUS | Status: DC

## 2016-01-04 MED ORDER — INSULIN ASPART 100 UNIT/ML ~~LOC~~ SOLN
0.0000 [IU] | SUBCUTANEOUS | Status: DC
  Administered 2016-01-04: 2 [IU] via SUBCUTANEOUS
  Administered 2016-01-04: 8 [IU] via SUBCUTANEOUS
  Administered 2016-01-04 (×2): 4 [IU] via SUBCUTANEOUS
  Administered 2016-01-05: 8 [IU] via SUBCUTANEOUS
  Administered 2016-01-05: 2 [IU] via SUBCUTANEOUS
  Administered 2016-01-05: 4 [IU] via SUBCUTANEOUS

## 2016-01-04 MED ORDER — ASPIRIN EC 81 MG PO TBEC
81.0000 mg | DELAYED_RELEASE_TABLET | Freq: Every day | ORAL | Status: DC
  Administered 2016-01-04 – 2016-01-05 (×2): 81 mg via ORAL
  Filled 2016-01-04 (×2): qty 1

## 2016-01-04 MED ORDER — ENOXAPARIN SODIUM 40 MG/0.4ML ~~LOC~~ SOLN
40.0000 mg | Freq: Every day | SUBCUTANEOUS | Status: DC
  Administered 2016-01-04 – 2016-01-10 (×7): 40 mg via SUBCUTANEOUS
  Filled 2016-01-04 (×7): qty 0.4

## 2016-01-04 MED ORDER — ASPIRIN 81 MG PO CHEW
81.0000 mg | CHEWABLE_TABLET | Freq: Every day | ORAL | Status: DC
Start: 2016-01-04 — End: 2016-01-05

## 2016-01-04 MED ORDER — INSULIN DETEMIR 100 UNIT/ML ~~LOC~~ SOLN
20.0000 [IU] | Freq: Every day | SUBCUTANEOUS | Status: DC
  Administered 2016-01-04 – 2016-01-06 (×3): 20 [IU] via SUBCUTANEOUS
  Filled 2016-01-04 (×4): qty 0.2

## 2016-01-04 MED ORDER — WARFARIN SODIUM 2.5 MG PO TABS
2.5000 mg | ORAL_TABLET | Freq: Once | ORAL | Status: AC
  Administered 2016-01-04: 2.5 mg via ORAL
  Filled 2016-01-04: qty 1

## 2016-01-04 NOTE — Progress Notes (Signed)
1 Day Post-Op Procedure(s) (LRB): AORTIC VALVE REPLACEMENT (AVR) (N/A) TRANSESOPHAGEAL ECHOCARDIOGRAM (TEE) (N/A) Subjective:  Sore but otherwise ok  Objective: Vital signs in last 24 hours: Temp:  [97.3 F (36.3 C)-99.1 F (37.3 C)] 98.4 F (36.9 C) (11/01 0715) Pulse Rate:  [50-80] 80 (11/01 0715) Cardiac Rhythm: Atrial paced (11/01 0600) Resp:  [12-24] 16 (11/01 0715) BP: (93-138)/(42-66) 113/46 (11/01 0700) SpO2:  [98 %-100 %] 100 % (11/01 0715) Arterial Line BP: (78-174)/(31-85) 151/46 (11/01 0715) FiO2 (%):  [40 %-50 %] 40 % (10/31 1632) Weight:  [106.6 kg (235 lb 0.2 oz)] 106.6 kg (235 lb 0.2 oz) (11/01 0500)  Hemodynamic parameters for last 24 hours: PAP: (25-40)/(11-22) 33/17 CO:  [3 L/min-5.7 L/min] 5.7 L/min CI:  [1.5 L/min/m2-2.7 L/min/m2] 2.7 L/min/m2  Intake/Output from previous day: 10/31 0701 - 11/01 0700 In: 5783 [P.O.:200; I.V.:3953; Blood:300; NG/GT:30; IV Piggyback:1300] Out: 3760 [Urine:2245; Emesis/NG output:200; Blood:900; Chest Tube:415] Intake/Output this shift: No intake/output data recorded.  General appearance: alert and cooperative Neurologic: intact Heart: regular rate and rhythm and normal valve click, no murmur Lungs: clear to auscultation bilaterally Extremities: edema mild Wound: dressing dry  Lab Results:  Recent Labs  01/03/16 1800 01/03/16 1807 01/04/16 0313  WBC 13.4*  --  17.6*  HGB 10.3* 9.9* 10.2*  HCT 30.6* 29.0* 31.1*  PLT 231  --  272   BMET:  Recent Labs  01/03/16 1807 01/04/16 0313  NA 139 136  K 4.6 4.5  CL 108 108  CO2  --  21*  GLUCOSE 127* 152*  BUN 14 11  CREATININE 0.80 0.91  CALCIUM  --  8.6*    PT/INR:  Recent Labs  01/03/16 1227  LABPROT 16.0*  INR 1.27   ABG    Component Value Date/Time   PHART 7.329 (L) 01/03/2016 1811   HCO3 21.3 01/03/2016 1811   TCO2 23 01/03/2016 1811   ACIDBASEDEF 4.0 (H) 01/03/2016 1811   O2SAT 99.0 01/03/2016 1811   CBG (last 3)   Recent Labs   01/04/16 0455 01/04/16 0604 01/04/16 0649  GLUCAP 132* 120* 109*   CXR: bibasilar atelectasis  ECG: sinus, no acute changes  Assessment/Plan: S/P Procedure(s) (LRB): AORTIC VALVE REPLACEMENT (AVR) (N/A) TRANSESOPHAGEAL ECHOCARDIOGRAM (TEE) (N/A)  She is hemodynamically stable in sinus rhythm. Amio started in OR for junctional tachy but sinus 70's now so will stop it and observe. Mobilize Diuresis Diabetes control: preop Hgb A1c 7.8. Start Levemir and SSI, stop insulin drip d/c tubes/lines Continue foley due to diuresing patient and patient in ICU Start coumadin for mechanical valve slowly tonight See progression orders   LOS: 1 day    Gaye Pollack 01/04/2016

## 2016-01-04 NOTE — Progress Notes (Signed)
[facility] BRIEF SICU PROGRESS NOTE  1 Day Post-Op  S/P Procedure(s) (LRB): AORTIC VALVE REPLACEMENT (AVR) (N/A) TRANSESOPHAGEAL ECHOCARDIOGRAM (TEE) (N/A)   Stable day NSR w/ stable BP Breathing comfortably w/ O2 sats 95-98% on room air UOP adequate Labs okay  Plan: Continue current plan  Rexene Alberts, MD 01/04/2016 6:50 PM

## 2016-01-05 ENCOUNTER — Encounter (HOSPITAL_COMMUNITY): Payer: Self-pay | Admitting: Surgery

## 2016-01-05 ENCOUNTER — Inpatient Hospital Stay (HOSPITAL_COMMUNITY): Payer: BC Managed Care – PPO

## 2016-01-05 LAB — PROTIME-INR
INR: 1.22
Prothrombin Time: 15.4 seconds — ABNORMAL HIGH (ref 11.4–15.2)

## 2016-01-05 LAB — CBC
HEMATOCRIT: 26.5 % — AB (ref 36.0–46.0)
Hemoglobin: 8.7 g/dL — ABNORMAL LOW (ref 12.0–15.0)
MCH: 28.1 pg (ref 26.0–34.0)
MCHC: 32.8 g/dL (ref 30.0–36.0)
MCV: 85.5 fL (ref 78.0–100.0)
Platelets: 184 10*3/uL (ref 150–400)
RBC: 3.1 MIL/uL — ABNORMAL LOW (ref 3.87–5.11)
RDW: 13.1 % (ref 11.5–15.5)
WBC: 13.6 10*3/uL — AB (ref 4.0–10.5)

## 2016-01-05 LAB — BASIC METABOLIC PANEL
Anion gap: 3 — ABNORMAL LOW (ref 5–15)
BUN: 16 mg/dL (ref 6–20)
CHLORIDE: 102 mmol/L (ref 101–111)
CO2: 26 mmol/L (ref 22–32)
Calcium: 8.3 mg/dL — ABNORMAL LOW (ref 8.9–10.3)
Creatinine, Ser: 1.25 mg/dL — ABNORMAL HIGH (ref 0.44–1.00)
GFR calc Af Amer: 53 mL/min — ABNORMAL LOW (ref >60)
GFR calc non Af Amer: 45 mL/min — ABNORMAL LOW (ref >60)
GLUCOSE: 136 mg/dL — AB (ref 65–99)
POTASSIUM: 4.4 mmol/L (ref 3.5–5.1)
Sodium: 131 mmol/L — ABNORMAL LOW (ref 135–145)

## 2016-01-05 LAB — GLUCOSE, CAPILLARY
GLUCOSE-CAPILLARY: 197 mg/dL — AB (ref 65–99)
Glucose-Capillary: 197 mg/dL — ABNORMAL HIGH (ref 65–99)
Glucose-Capillary: 126 mg/dL — ABNORMAL HIGH (ref 65–99)
Glucose-Capillary: 171 mg/dL — ABNORMAL HIGH (ref 65–99)
Glucose-Capillary: 235 mg/dL — ABNORMAL HIGH (ref 65–99)
Glucose-Capillary: 146 mg/dL — ABNORMAL HIGH (ref 65–99)

## 2016-01-05 MED ORDER — SODIUM CHLORIDE 0.9% FLUSH
3.0000 mL | Freq: Two times a day (BID) | INTRAVENOUS | Status: DC
  Administered 2016-01-05 – 2016-01-10 (×8): 3 mL via INTRAVENOUS

## 2016-01-05 MED ORDER — ONDANSETRON HCL 4 MG/2ML IJ SOLN: 4.0000 mg | Freq: Four times a day (QID) | INTRAMUSCULAR | Status: DC | PRN

## 2016-01-05 MED ORDER — SODIUM CHLORIDE 0.9 % IV SOLN: 250.0000 mL | INTRAVENOUS | Status: DC | PRN

## 2016-01-05 MED ORDER — WARFARIN - PHYSICIAN DOSING INPATIENT
Freq: Every day | Status: DC
  Administered 2016-01-05: 17:00:00
  Administered 2016-01-06: 1

## 2016-01-05 MED ORDER — ASPIRIN EC 81 MG PO TBEC
81.0000 mg | DELAYED_RELEASE_TABLET | Freq: Every day | ORAL | Status: DC
  Administered 2016-01-06 – 2016-01-11 (×6): 81 mg via ORAL
  Filled 2016-01-05 (×6): qty 1

## 2016-01-05 MED ORDER — BISACODYL 10 MG RE SUPP: 10.0000 mg | Freq: Every day | RECTAL | Status: DC | PRN

## 2016-01-05 MED ORDER — METOPROLOL TARTRATE 12.5 MG HALF TABLET
12.5000 mg | ORAL_TABLET | Freq: Two times a day (BID) | ORAL | Status: DC
  Administered 2016-01-05: 12.5 mg via ORAL
  Filled 2016-01-05: qty 1

## 2016-01-05 MED ORDER — OXYCODONE HCL 5 MG PO TABS: 5.0000 mg | ORAL_TABLET | ORAL | Status: DC | PRN

## 2016-01-05 MED ORDER — INSULIN ASPART 100 UNIT/ML ~~LOC~~ SOLN
0.0000 [IU] | Freq: Three times a day (TID) | SUBCUTANEOUS | Status: DC
  Administered 2016-01-05: 4 [IU] via SUBCUTANEOUS
  Administered 2016-01-05: 2 [IU] via SUBCUTANEOUS
  Administered 2016-01-06: 16 [IU] via SUBCUTANEOUS
  Administered 2016-01-06 (×2): 2 [IU] via SUBCUTANEOUS
  Administered 2016-01-06: 8 [IU] via SUBCUTANEOUS
  Administered 2016-01-07: 16 [IU] via SUBCUTANEOUS
  Administered 2016-01-07: 12 [IU] via SUBCUTANEOUS
  Administered 2016-01-07: 2 [IU] via SUBCUTANEOUS
  Administered 2016-01-08: 12 [IU] via SUBCUTANEOUS
  Administered 2016-01-08: 4 [IU] via SUBCUTANEOUS
  Administered 2016-01-09: 12 [IU] via SUBCUTANEOUS
  Administered 2016-01-10: 2 [IU] via SUBCUTANEOUS
  Administered 2016-01-10: 4 [IU] via SUBCUTANEOUS
  Administered 2016-01-11: 2 [IU] via SUBCUTANEOUS

## 2016-01-05 MED ORDER — DOCUSATE SODIUM 100 MG PO CAPS
200.0000 mg | ORAL_CAPSULE | Freq: Every day | ORAL | Status: DC
  Administered 2016-01-06 – 2016-01-11 (×3): 200 mg via ORAL
  Filled 2016-01-05 (×4): qty 2

## 2016-01-05 MED ORDER — TRAMADOL HCL 50 MG PO TABS: 50.0000 mg | ORAL_TABLET | ORAL | Status: DC | PRN

## 2016-01-05 MED ORDER — WARFARIN SODIUM 5 MG PO TABS: 5.0000 mg | ORAL_TABLET | Freq: Once | ORAL | Status: DC

## 2016-01-05 MED ORDER — ACETAMINOPHEN 325 MG PO TABS
650.0000 mg | ORAL_TABLET | Freq: Four times a day (QID) | ORAL | Status: DC | PRN
  Administered 2016-01-05 – 2016-01-10 (×2): 650 mg via ORAL
  Filled 2016-01-05 (×3): qty 2

## 2016-01-05 MED ORDER — ONDANSETRON HCL 4 MG PO TABS: 4.0000 mg | ORAL_TABLET | Freq: Four times a day (QID) | ORAL | Status: DC | PRN

## 2016-01-05 MED ORDER — SODIUM CHLORIDE 0.9% FLUSH: 3.0000 mL | INTRAVENOUS | Status: DC | PRN

## 2016-01-05 MED ORDER — WARFARIN SODIUM 5 MG PO TABS
5.0000 mg | ORAL_TABLET | Freq: Once | ORAL | Status: AC
  Administered 2016-01-05: 5 mg via ORAL
  Filled 2016-01-05: qty 1

## 2016-01-05 MED ORDER — FAMOTIDINE 20 MG PO TABS
20.0000 mg | ORAL_TABLET | Freq: Two times a day (BID) | ORAL | Status: DC
  Administered 2016-01-05 – 2016-01-11 (×12): 20 mg via ORAL
  Filled 2016-01-05 (×12): qty 1

## 2016-01-05 MED ORDER — MOVING RIGHT ALONG BOOK
Freq: Once | Status: DC
  Filled 2016-01-05: qty 1

## 2016-01-05 MED ORDER — FUROSEMIDE 40 MG PO TABS
40.0000 mg | ORAL_TABLET | Freq: Every day | ORAL | Status: AC
  Administered 2016-01-06 – 2016-01-08 (×3): 40 mg via ORAL
  Filled 2016-01-05 (×3): qty 1

## 2016-01-05 MED ORDER — BISACODYL 5 MG PO TBEC: 10.0000 mg | DELAYED_RELEASE_TABLET | Freq: Every day | ORAL | Status: DC | PRN

## 2016-01-05 MED FILL — Heparin Sodium (Porcine) Inj 1000 Unit/ML: INTRAMUSCULAR | Qty: 2500 | Status: AC

## 2016-01-05 NOTE — Significant Event (Signed)
Attempted to call report earlier when bed became available. Charge RN in receiving unit stated will be changing room. Attempted again at this time. This RN gave call back number as requested.

## 2016-01-05 NOTE — Significant Event (Signed)
Patient transferred safely to 2W, ambulated there with RN Berenice Primas. Report has been given to receiving RN. VS stable prior to transfer. [name], Therapist, sports

## 2016-01-05 NOTE — Anesthesia Postprocedure Evaluation (Signed)
Anesthesia Post Note  Patient: [name]  Procedure(s) Performed: Procedure(s) (LRB): AORTIC VALVE REPLACEMENT (AVR) (N/A) TRANSESOPHAGEAL ECHOCARDIOGRAM (TEE) (N/A)  Patient location during evaluation: SICU Anesthesia Type: General Level of consciousness: patient remains intubated per anesthesia plan Pain management: pain level controlled Vital Signs Assessment: post-procedure vital signs reviewed and stable Respiratory status: patient remains intubated per anesthesia plan Cardiovascular status: stable Anesthetic complications: no    Last Vitals:  Vitals:   01/05/16 1613 01/05/16 2121  BP: (!) 151/50 (!) 156/41  Pulse: 84 93  Resp: 18 18  Temp: 36.9 C 37 C    Last Pain:  Vitals:   01/05/16 2121  TempSrc: Oral  PainSc:                  EDWARDS,Latriece Anstine

## 2016-01-05 NOTE — Progress Notes (Signed)
2 Days Post-Op Procedure(s) (LRB): AORTIC VALVE REPLACEMENT (AVR) (N/A) TRANSESOPHAGEAL ECHOCARDIOGRAM (TEE) (N/A) Subjective:  Sore but otherwise ok  Objective: Vital signs in last 24 hours: Temp:  [97.2 F (36.2 C)-98.4 F (36.9 C)] 98 F (36.7 C) (11/02 0400) Pulse Rate:  [70-88] 78 (11/02 0700) Cardiac Rhythm: Normal sinus rhythm (11/02 0400) Resp:  [15-25] 18 (11/02 0700) BP: (102-167)/(42-64) 124/50 (11/02 0700) SpO2:  [92 %-100 %] 92 % (11/02 0700) Arterial Line BP: (124-206)/(38-74) 206/52 (11/01 1600) Weight:  [108.2 kg (238 lb 8.6 oz)] 108.2 kg (238 lb 8.6 oz) (11/02 0600)  Hemodynamic parameters for last 24 hours: PAP: (30-31)/(10-14) 31/14 CO:  [5.8 L/min] 5.8 L/min CI:  [2.7 L/min/m2] 2.7 L/min/m2  Intake/Output from previous day: 11/01 0701 - 11/02 0700 In: 623.6 [P.O.:360; I.V.:263.6] Out: 1750 [Urine:1710; Chest Tube:40] Intake/Output this shift: No intake/output data recorded.  General appearance: alert and cooperative Neurologic: intact Heart: regular rate and rhythm and crisp mechanical valve click Lungs: clear to auscultation bilaterally Extremities: edema mild Wound: dressing dry  Lab Results:  Recent Labs  01/04/16 1600 01/04/16 1602 01/05/16 0415  WBC 16.9*  --  13.6*  HGB 9.9* 10.2* 8.7*  HCT 29.8* 30.0* 26.5*  PLT 218  --  184   BMET:  Recent Labs  01/04/16 0313  01/04/16 1602 01/05/16 0415  NA 136  --  132* 131*  K 4.5  --  4.6 4.4  CL 108  --  99* 102  CO2 21*  --   --  26  GLUCOSE 152*  --  237* 136*  BUN 11  --  15 16  CREATININE 0.91  < > 1.10* 1.25*  CALCIUM 8.6*  --   --  8.3*  < > = values in this interval not displayed.  PT/INR:  Recent Labs  01/05/16 0415  LABPROT 15.4*  INR 1.22   ABG    Component Value Date/Time   PHART 7.329 (L) 01/03/2016 1811   HCO3 21.3 01/03/2016 1811   TCO2 20 01/04/2016 1602   ACIDBASEDEF 4.0 (H) 01/03/2016 1811   O2SAT 99.0 01/03/2016 1811   CBG (last 3)   Recent Labs  01/04/16 1919 01/04/16 2344 01/05/16 0356  GLUCAP 201* 146* 126*   CXR: mild bibasilar atelectasis  Assessment/Plan: S/P Procedure(s) (LRB): AORTIC VALVE REPLACEMENT (AVR) (N/A) TRANSESOPHAGEAL ECHOCARDIOGRAM (TEE) (N/A)  Hemodynamically stable in sinus rhythm. Continue low dose lopressor Mild volume excess. Creat up slightly. Will start diuretic tomorrow. Coumadin 5 mg today DM: glucose under good control. Continue Levemir and SSI. Transfer to 2W and continue ambulation and IS.   LOS: 2 days    Gaye Pollack 01/05/2016

## 2016-01-06 LAB — GLUCOSE, CAPILLARY
GLUCOSE-CAPILLARY: 136 mg/dL — AB (ref 65–99)
Glucose-Capillary: 235 mg/dL — ABNORMAL HIGH (ref 65–99)
Glucose-Capillary: 142 mg/dL — ABNORMAL HIGH (ref 65–99)
Glucose-Capillary: 315 mg/dL — ABNORMAL HIGH (ref 65–99)

## 2016-01-06 LAB — ECHO TEE
AO mean calculated velocity dopler: 296 cm/s
AOPV: 0.19 m/s
AOVTI: 111 cm
AV Area VTI: 0.6 cm2
AV Peak grad: 71 mmHg
AV peak Index: 0.28
AV vel: 0.52
AVAREAMEANV: 0.55 cm2
AVAREAMEANVIN: 0.26 cm2/m2
AVAREAVTIIND: 0.25 cm2/m2
AVCELMEANRAT: 0.17
AVG: 41 mmHg
AVPKVEL: 420 cm/s
LDCA: 3.14 cm2
LVOT VTI: 18.3 cm
LVOT diameter: 20 mm
LVOT peak VTI: 0.16 cm
LVOTPV: 79.8 cm/s
LVOTSV: 57 mL
Valve area index: 0.25
Valve area: 0.52 cm2

## 2016-01-06 LAB — CBC
HCT: 27.8 % — ABNORMAL LOW (ref 36.0–46.0)
Hemoglobin: 9.3 g/dL — ABNORMAL LOW (ref 12.0–15.0)
MCH: 28.3 pg (ref 26.0–34.0)
MCHC: 33.5 g/dL (ref 30.0–36.0)
MCV: 84.5 fL (ref 78.0–100.0)
PLATELETS: 223 10*3/uL (ref 150–400)
RBC: 3.29 MIL/uL — AB (ref 3.87–5.11)
RDW: 12.8 % (ref 11.5–15.5)
WBC: 12.4 10*3/uL — AB (ref 4.0–10.5)

## 2016-01-06 LAB — BASIC METABOLIC PANEL
ANION GAP: 6 (ref 5–15)
BUN: 17 mg/dL (ref 6–20)
CO2: 26 mmol/L (ref 22–32)
Calcium: 8.9 mg/dL (ref 8.9–10.3)
Chloride: 100 mmol/L — ABNORMAL LOW (ref 101–111)
Creatinine, Ser: 1.04 mg/dL — ABNORMAL HIGH (ref 0.44–1.00)
GFR, EST NON AFRICAN AMERICAN: 57 mL/min — AB (ref >60)
Glucose, Bld: 119 mg/dL — ABNORMAL HIGH (ref 65–99)
POTASSIUM: 4.6 mmol/L (ref 3.5–5.1)
SODIUM: 132 mmol/L — AB (ref 135–145)

## 2016-01-06 LAB — PROTIME-INR
INR: 1.12
PROTHROMBIN TIME: 14.5 s (ref 11.4–15.2)

## 2016-01-06 MED ORDER — METOPROLOL TARTRATE 25 MG PO TABS
25.0000 mg | ORAL_TABLET | Freq: Two times a day (BID) | ORAL | Status: DC
  Administered 2016-01-06 – 2016-01-08 (×5): 25 mg via ORAL
  Filled 2016-01-06 (×5): qty 1

## 2016-01-06 MED ORDER — INSULIN DETEMIR 100 UNIT/ML ~~LOC~~ SOLN
30.0000 [IU] | Freq: Every day | SUBCUTANEOUS | Status: DC
  Administered 2016-01-07 – 2016-01-11 (×5): 30 [IU] via SUBCUTANEOUS
  Filled 2016-01-06 (×5): qty 0.3

## 2016-01-06 MED ORDER — WARFARIN SODIUM 5 MG PO TABS
5.0000 mg | ORAL_TABLET | Freq: Once | ORAL | Status: AC
  Administered 2016-01-06: 5 mg via ORAL
  Filled 2016-01-06: qty 1

## 2016-01-06 MED ORDER — LISINOPRIL 10 MG PO TABS
10.0000 mg | ORAL_TABLET | Freq: Every day | ORAL | Status: DC
  Administered 2016-01-06 – 2016-01-07 (×2): 10 mg via ORAL
  Filled 2016-01-06 (×2): qty 1

## 2016-01-06 NOTE — Discharge Instructions (Addendum)
Information on my medicine - Coumadin®   (Warfarin) ° °This medication education was reviewed with me or my healthcare representative as part of my discharge preparation. ° °Why was Coumadin prescribed for you? °Coumadin was prescribed for you because you have a blood clot or a medical condition that can cause an increased risk of forming blood clots. Blood clots can cause serious health problems by blocking the flow of blood to the heart, lung, or brain. Coumadin can prevent harmful blood clots from forming. °As a reminder your indication for Coumadin is:   Blood Clot Prevention After Heart Valve Surgery ° °What test will check on my response to Coumadin? °While on Coumadin (warfarin) you will need to have an INR test regularly to ensure that your dose is keeping you in the desired range. The INR (international normalized ratio) number is calculated from the result of the laboratory test called prothrombin time (PT). ° °If an INR APPOINTMENT HAS NOT ALREADY BEEN MADE FOR YOU please schedule an appointment to have this lab work done by your health care provider within 7 days. °Your INR goal is usually a number between:  2 to 3 or your provider may give you a more narrow range like 2-2.5.  Ask your health care provider during an office visit what your goal INR is. ° °What  do you need to  know  About  COUMADIN? °Take Coumadin (warfarin) exactly as prescribed by your healthcare provider about the same time each day.  DO NOT stop taking without talking to the doctor who prescribed the medication.  Stopping without other blood clot prevention medication to take the place of Coumadin may increase your risk of developing a new clot or stroke.  Get refills before you run out. ° °What do you do if you miss a dose? °If you miss a dose, take it as soon as you remember on the same day then continue your regularly scheduled regimen the next day.  Do not take two doses of Coumadin at the same time. ° °Important Safety  Information °A possible side effect of Coumadin (Warfarin) is an increased risk of bleeding. You should call your healthcare provider right away if you experience any of the following: °? Bleeding from an injury or your nose that does not stop. °? Unusual colored urine (red or dark brown) or unusual colored stools (red or black). °? Unusual bruising for unknown reasons. °? A serious fall or if you hit your head (even if there is no bleeding). ° °Some foods or medicines interact with Coumadin® (warfarin) and might alter your response to warfarin. To help avoid this: °? Eat a balanced diet, maintaining a consistent amount of Vitamin K. °? Notify your provider about major diet changes you plan to make. °? Avoid alcohol or limit your intake to 1 drink for women and 2 drinks for men per day. °(1 drink is 5 oz. wine, 12 oz. beer, or 1.5 oz. liquor.) ° °Make sure that ANY health care provider who prescribes medication for you knows that you are taking Coumadin (warfarin).  Also make sure the healthcare provider who is monitoring your Coumadin knows when you have started a new medication including herbals and non-prescription products. ° °Coumadin® (Warfarin)  Major Drug Interactions  °Increased Warfarin Effect Decreased Warfarin Effect  °Alcohol (large quantities) °Antibiotics (esp. Septra/Bactrim, Flagyl, Cipro) °Amiodarone (Cordarone) °Aspirin (ASA) °Cimetidine (Tagamet) °Megestrol (Megace) °NSAIDs (ibuprofen, naproxen, etc.) °Piroxicam (Feldene) °Propafenone (Rythmol SR) °Propranolol (Inderal) °Isoniazid (INH) °Posaconazole (Noxafil) Barbiturates (Phenobarbital) °Carbamazepine (  Phenobarbital) Carbamazepine (Tegretol) Chlordiazepoxide (Librium) Cholestyramine (Questran) Griseofulvin Oral Contraceptives Rifampin Sucralfate (Carafate) Vitamin K   Coumadin (Warfarin) Major Herbal Interactions  Increased Warfarin Effect Decreased Warfarin Effect  Garlic Ginseng Ginkgo biloba Coenzyme Q10 Green tea St. Johns wort    Coumadin (Warfarin)  FOOD Interactions  Eat a consistent number of servings per week of foods HIGH in Vitamin K (1 serving =  cup)  Collards (cooked, or boiled & drained) Kale (cooked, or boiled & drained) Mustard greens (cooked, or boiled & drained) Parsley *serving size only =  cup Spinach (cooked, or boiled & drained) Swiss chard (cooked, or boiled & drained) Turnip greens (cooked, or boiled & drained)  Eat a consistent number of servings per week of foods MEDIUM-HIGH in Vitamin K (1 serving = 1 cup)  Asparagus (cooked, or boiled & drained) Broccoli (cooked, boiled & drained, or raw & chopped) Brussel sprouts (cooked, or boiled & drained) *serving size only =  cup Lettuce, raw (green leaf, endive, romaine) Spinach, raw Turnip greens, raw & chopped   These websites have more information on Coumadin (warfarin):  FailFactory.se; VeganReport.com.au;  Aortic Valve Replacement, Care After Refer to this sheet in the next few weeks. These instructions provide you with information on caring for yourself after your procedure. Your health care provider may also give you specific instructions. Your treatment has been planned according to current medical practices, but problems sometimes occur. Call your health care provider if you have any problems or questions after your procedure. HOME CARE INSTRUCTIONS   Take medicines only as directed by your health care provider.  If your health care provider has prescribed elastic stockings, wear them as directed.  Take frequent naps or rest often throughout the day.  Avoid lifting over 10 lbs (4.5 kg) or pushing or pulling things with your arms for 6-8 weeks or as directed by your health care provider.  Avoid driving or airplane travel for 4-6 weeks after surgery or as directed by your health care provider. If you are riding in a car for an extended period, stop every 1-2 hours to stretch your legs. Keep a record of your medicines and medical history  with you when traveling.  Do not drive or operate heavy machinery while taking pain medicine. (narcotics).  Do not cross your legs.  Do not use any tobacco products including cigarettes, chewing tobacco, or electronic cigarettes. If you need help quitting, ask your health care provider.  Do not take baths, swim, or use a hot tub until your health care provider approves. Take showers once your health care provider approves. Pat incisions dry. Do not rub incisions with a washcloth or towel.  Avoid climbing stairs and using the handrail to pull yourself up for the first 2-3 weeks after surgery.  Return to work as directed by your health care provider.  Drink enough fluid to keep your urine clear or pale yellow.  Do not strain to have a bowel movement. Eat high-fiber foods if you become constipated. You may also take a medicine to help you have a bowel movement (laxative) as directed by your health care provider.  Resume sexual activity as directed by your health care provider. Men should not use medicines for erectile dysfunction until their doctor says it isokay.  If you had a certain type of heart condition in the past, you may need to take antibiotic medicine before having dental work or surgery. Let your dentist and health care providers know if you had one or more  of the following:  Previous endocarditis.  An artificial (prosthetic) heart valve.  Congenital heart disease. SEEK MEDICAL CARE IF:  You develop a skin rash.   You experience sudden changes in your weight.  You have a fever. SEEK IMMEDIATE MEDICAL CARE IF:   You develop chest pain that is not coming from your incision.  You have drainage (pus), redness, swelling, or pain at your incision site.   You develop shortness of breath or have difficulty breathing.   You have increased bleeding from your incision site.   You develop light-headedness.  MAKE SURE YOU:   Understand these directions.  Will watch  your condition.  Will get help right away if you are not doing well or get worse.   This information is not intended to replace advice given to you by your health care provider. Make sure you discuss any questions you have with your health care provider.   Document Released: 09/07/2004 Document Revised: 03/12/2014 Document Reviewed: 12/04/2011 Elsevier Interactive Patient Education [employer].

## 2016-01-06 NOTE — Progress Notes (Signed)
Pacing wires removed per order. Frequent VS set up and patient instructed on bedrest x 1hr.

## 2016-01-06 NOTE — Progress Notes (Addendum)
[address]       [place] [address]             [phone]      3 Days Post-Op Procedure(s) (LRB): AORTIC VALVE REPLACEMENT (AVR) (N/A) TRANSESOPHAGEAL ECHOCARDIOGRAM (TEE) (N/A) Subjective: Feeling well   Objective: Vital signs in last 24 hours: Temp:  [97.6 F (36.4 C)-98.6 F (37 C)] 97.6 F (36.4 C) (11/03 0442) Pulse Rate:  [84-103] 103 (11/03 0856) Cardiac Rhythm: Normal sinus rhythm (11/03 0700) Resp:  [18-25] 20 (11/03 0442) BP: (143-186)/(41-67) 155/67 (11/03 0856) SpO2:  [95 %-97 %] 96 % (11/03 0442) Weight:  [235 lb 14.3 oz (107 kg)] 235 lb 14.3 oz (107 kg) (11/03 0500)  Hemodynamic parameters for last 24 hours:    Intake/Output from previous day: 11/02 0701 - 11/03 0700 In: 630 [P.O.:630] Out: 1470 [Urine:1470] Intake/Output this shift: Total I/O In: 220 [P.O.:220] Out: -   General appearance: alert, cooperative and no distress Heart: regular rate and rhythm and no murmur Lungs: min dim in bases Abdomen: benign Extremities: minor edema Wound: dressing CDI  Lab Results:  Recent Labs  01/05/16 0415 01/06/16 0204  WBC 13.6* 12.4*  HGB 8.7* 9.3*  HCT 26.5* 27.8*  PLT 184 223   BMET:  Recent Labs  01/05/16 0415 01/06/16 0204  NA 131* 132*  K 4.4 4.6  CL 102 100*  CO2 26 26  GLUCOSE 136* 119*  BUN 16 17  CREATININE 1.25* 1.04*  CALCIUM 8.3* 8.9    PT/INR:  Recent Labs  01/06/16 0204  LABPROT 14.5  INR 1.12   ABG    Component Value Date/Time   PHART 7.329 (L) 01/03/2016 1811   HCO3 21.3 01/03/2016 1811   TCO2 20 01/04/2016 1602   ACIDBASEDEF 4.0 (H) 01/03/2016 1811   O2SAT 99.0 01/03/2016 1811   CBG (last 3)   Recent Labs  01/05/16 1640 01/05/16 2117 01/06/16 0618  GLUCAP 197* 146* 136*    Meds Scheduled Meds: . aspirin EC  81 mg Oral Daily  . docusate sodium  200 mg Oral Daily  . enoxaparin (LOVENOX) injection  40 mg Subcutaneous QHS  . famotidine  20 mg Oral BID  . furosemide  40  mg Oral Daily  . insulin aspart  0-24 Units Subcutaneous TID AC & HS  . insulin detemir  20 Units Subcutaneous Daily  . lisinopril  10 mg Oral Daily  . metoprolol tartrate  25 mg Oral BID  . moving right along book   Does not apply Once  . sodium chloride flush  3 mL Intravenous Q12H  . warfarin  5 mg Oral ONCE-1800  . Warfarin - Physician Dosing Inpatient   Does not apply q1800   Continuous Infusions:  PRN Meds:.sodium chloride, acetaminophen, bisacodyl **OR** bisacodyl, ondansetron **OR** ondansetron (ZOFRAN) IV, oxyCODONE, sodium chloride flush, traMADol  Xrays Dg Chest Port 1 View  Result Date: 01/05/2016 CLINICAL DATA:  Status post aortic valve repair with chest pain EXAM: PORTABLE CHEST 1 VIEW COMPARISON:  01/04/2016 FINDINGS: Right jugular sheath remains. The Swan-Ganz catheter has been removed however. Mediastinal drain has also been removed as well as apparent pericardial drain. Small bilateral pleural effusions are seen. Some likely underlying atelectasis is present as well. No pneumothorax is seen. IMPRESSION: Small bilateral pleural effusions. Electronically Signed   By: Inez Catalina M.D.   On: 01/05/2016 07:50    Assessment/Plan: S/P Procedure(s) (LRB): AORTIC VALVE REPLACEMENT (AVR) (N/A) TRANSESOPHAGEAL ECHOCARDIOGRAM (TEE) (N/A)  1 doing well 2 cont coumadin for mech valve 3 ace-I started for HTN 4 Leukocytosis improved 5 creat improve 6 sugars need better control- increase lasix 7 d/c epw's 8 routine pulm toilet/rehab  LOS: 3 days    [name],[name] 01/06/2016   Chart reviewed, patient examined, agree with above. She is ambulating well and feels fine. Will resume metformin tomorrow. If BP remains high will increase lisinopril to preop dose assuming creat remains stable.

## 2016-01-06 NOTE — Progress Notes (Signed)
CARDIAC REHAB PHASE I   PRE:  Rate/Rhythm: 95 SR  BP:  Supine:   Sitting: 158/60  Standing:    SaO2: 98%RA  MODE:  Ambulation: 550 ft   POST:  Rate/Rhythm: 101 ST  BP:  Supine:   Sitting: 174/58  Standing:    SaO2: 97%RA 0930-0957 Pt walked 550 ft on RA with rolling walker and minimal asst. Tolerated well. Did not need to stop to rest. Knows to walk a few more times today with assistance. To recliner with call bell.   Graylon Good, RN BSN  01/06/2016 9:54 AM

## 2016-01-07 LAB — BASIC METABOLIC PANEL
ANION GAP: 3 — AB (ref 5–15)
BUN: 19 mg/dL (ref 6–20)
CALCIUM: 8.7 mg/dL — AB (ref 8.9–10.3)
CO2: 28 mmol/L (ref 22–32)
Chloride: 102 mmol/L (ref 101–111)
Creatinine, Ser: 1.03 mg/dL — ABNORMAL HIGH (ref 0.44–1.00)
GFR, EST NON AFRICAN AMERICAN: 57 mL/min — AB (ref >60)
Glucose, Bld: 111 mg/dL — ABNORMAL HIGH (ref 65–99)
POTASSIUM: 3.7 mmol/L (ref 3.5–5.1)
SODIUM: 133 mmol/L — AB (ref 135–145)

## 2016-01-07 LAB — PROTIME-INR
INR: 1.1
PROTHROMBIN TIME: 14.2 s (ref 11.4–15.2)

## 2016-01-07 LAB — GLUCOSE, CAPILLARY
GLUCOSE-CAPILLARY: 139 mg/dL — AB (ref 65–99)
GLUCOSE-CAPILLARY: 305 mg/dL — AB (ref 65–99)
GLUCOSE-CAPILLARY: 179 mg/dL — AB (ref 65–99)
Glucose-Capillary: 291 mg/dL — ABNORMAL HIGH (ref 65–99)

## 2016-01-07 MED ORDER — METFORMIN HCL 500 MG PO TABS
1000.0000 mg | ORAL_TABLET | Freq: Two times a day (BID) | ORAL | Status: DC
  Administered 2016-01-07 – 2016-01-11 (×8): 1000 mg via ORAL
  Filled 2016-01-07 (×9): qty 2

## 2016-01-07 MED ORDER — WARFARIN SODIUM 5 MG PO TABS: 5.0000 mg | ORAL_TABLET | Freq: Every day | ORAL | Status: DC

## 2016-01-07 MED ORDER — POTASSIUM CHLORIDE CRYS ER 20 MEQ PO TBCR
20.0000 meq | EXTENDED_RELEASE_TABLET | Freq: Every day | ORAL | Status: DC
Start: 2016-01-07 — End: 2016-01-10
  Administered 2016-01-07 – 2016-01-10 (×4): 20 meq via ORAL
  Filled 2016-01-07 (×3): qty 1

## 2016-01-07 MED ORDER — LISINOPRIL 10 MG PO TABS
20.0000 mg | ORAL_TABLET | Freq: Every day | ORAL | Status: DC
  Administered 2016-01-08 – 2016-01-09 (×2): 20 mg via ORAL
  Filled 2016-01-07 (×2): qty 2

## 2016-01-07 MED ORDER — GLYBURIDE 5 MG PO TABS
5.0000 mg | ORAL_TABLET | Freq: Two times a day (BID) | ORAL | Status: DC
  Administered 2016-01-07 – 2016-01-11 (×8): 5 mg via ORAL
  Filled 2016-01-07 (×8): qty 1

## 2016-01-07 MED ORDER — WARFARIN SODIUM 7.5 MG PO TABS
7.5000 mg | ORAL_TABLET | Freq: Every day | ORAL | Status: AC
  Administered 2016-01-07: 7.5 mg via ORAL
  Filled 2016-01-07: qty 1

## 2016-01-07 NOTE — Progress Notes (Signed)
CARDIAC REHAB PHASE I   PRE:  Rate/Rhythm: 79 SR  BP:  Supine:   Sitting: 151/55  Standing:    SaO2: 100% RA  MODE:  Ambulation: 500 ft   POST:  Rate/Rhythm: 90  BP:  Supine:   Sitting: 107/60  Standing:    SaO2: 100% RA  [id number] Patient tolerated ambulation well with assist x1, gait slow, steady no c/o, VSS. Reviewed sternal precautions, IS use, and restrictions. Showed pt how to cue up Recovery from [facility] video. Discussed Phase 2 cardiac rehab, and pt is interested in the program at [facility]. Will send referral.  Seward Carol, MS, ACSM CEP

## 2016-01-07 NOTE — Progress Notes (Addendum)
      [address]       [place] [address]             [phone]      4 Days Post-Op Procedure(s) (LRB): AORTIC VALVE REPLACEMENT (AVR) (N/A) TRANSESOPHAGEAL ECHOCARDIOGRAM (TEE) (N/A) Subjective: Feels good this morning. Walking in her room.   Objective: Vital signs in last 24 hours: Temp:  [98.1 F (36.7 C)-98.6 F (37 C)] 98.1 F (36.7 C) (11/04 0931) Pulse Rate:  [90-109] 92 (11/04 0931) Cardiac Rhythm: Normal sinus rhythm (11/04 0700) Resp:  [18] 18 (11/04 0931) BP: (150-170)/(50-62) 152/62 (11/04 0931) SpO2:  [93 %-97 %] 97 % (11/04 0931) Weight:  [228 lb 6.3 oz (103.6 kg)] 228 lb 6.3 oz (103.6 kg) (11/04 0525)     Intake/Output from previous day: 11/03 0701 - 11/04 0700 In: 1282 [P.O.:1282] Out: 450 [Urine:450] Intake/Output this shift: Total I/O In: 360 [P.O.:360] Out: 550 [Urine:550]  General appearance: alert, cooperative and no distress Heart: regular rate and rhythm Lungs: clear to auscultation bilaterally Abdomen: soft, non-tender; bowel sounds normal; no masses,  no organomegaly Extremities: extremities normal, atraumatic, no cyanosis or edema Wound: c/d/i without drainage  Lab Results:  Recent Labs  01/05/16 0415 01/06/16 0204  WBC 13.6* 12.4*  HGB 8.7* 9.3*  HCT 26.5* 27.8*  PLT 184 223   BMET:  Recent Labs  01/06/16 0204 01/07/16 0349  NA 132* 133*  K 4.6 3.7  CL 100* 102  CO2 26 28  GLUCOSE 119* 111*  BUN 17 19  CREATININE 1.04* 1.03*  CALCIUM 8.9 8.7*    PT/INR:  Recent Labs  01/07/16 0349  LABPROT 14.2  INR 1.10   ABG    Component Value Date/Time   PHART 7.329 (L) 01/03/2016 1811   HCO3 21.3 01/03/2016 1811   TCO2 20 01/04/2016 1602   ACIDBASEDEF 4.0 (H) 01/03/2016 1811   O2SAT 99.0 01/03/2016 1811   CBG (last 3)   Recent Labs  01/06/16 1625 01/06/16 2118 01/07/16 0639  GLUCAP 142* 315* 139*    Assessment/Plan: S/P Procedure(s) (LRB): AORTIC VALVE REPLACEMENT (AVR)  (N/A) TRANSESOPHAGEAL ECHOCARDIOGRAM (TEE) (N/A)  1. CV- NSR in the 90s. BP remains elevated. Will increase lisinopril to home dose. Also on Lopressor 25mg . 2. Pulm- on room air with good oxygen saturation. Encouraged hourly use of incentive spirometry.  3. Renal-stable creatinine 1.03, Replace potassium. Weight trending down.  4. Endocrine-moderate control. Continue SSI 5. H and H stable 6. Anticoagulation-Coumadin 5mg  today. INR 1.01 today, mechanical valve.   Plan: Increase ACEI for better blood pressure control. Continue diuretics. Coumadin 5mg  today, trend INR.     LOS: 4 days    Elgie Collard 01/07/2016   Chart reviewed, patient examined, agree with above. She is doing well. INR has not bumped so will give 7.5 mg coumadin today. Glucose is still too high so will resume Glucophage and glyburide. Continue Levemir.

## 2016-01-08 ENCOUNTER — Encounter (HOSPITAL_COMMUNITY): Payer: Self-pay

## 2016-01-08 LAB — PROTIME-INR
INR: 1.09
Prothrombin Time: 14.1 seconds (ref 11.4–15.2)

## 2016-01-08 LAB — GLUCOSE, CAPILLARY
GLUCOSE-CAPILLARY: 109 mg/dL — AB (ref 65–99)
GLUCOSE-CAPILLARY: 311 mg/dL — AB (ref 65–99)
Glucose-Capillary: 219 mg/dL — ABNORMAL HIGH (ref 65–99)
Glucose-Capillary: 104 mg/dL — ABNORMAL HIGH (ref 65–99)

## 2016-01-08 MED ORDER — AMLODIPINE BESYLATE 5 MG PO TABS
2.5000 mg | ORAL_TABLET | Freq: Every day | ORAL | Status: DC
  Administered 2016-01-08: 2.5 mg via ORAL
  Filled 2016-01-08: qty 1

## 2016-01-08 MED ORDER — POTASSIUM CHLORIDE CRYS ER 20 MEQ PO TBCR
30.0000 meq | EXTENDED_RELEASE_TABLET | Freq: Once | ORAL | Status: AC
  Administered 2016-01-08: 30 meq via ORAL
  Filled 2016-01-08: qty 1

## 2016-01-08 MED ORDER — POLYETHYLENE GLYCOL 3350 17 G PO PACK: 17.0000 g | PACK | Freq: Every day | ORAL | Status: DC | PRN

## 2016-01-08 MED ORDER — WARFARIN SODIUM 10 MG PO TABS
10.0000 mg | ORAL_TABLET | Freq: Every day | ORAL | Status: DC
  Administered 2016-01-08: 10 mg via ORAL
  Filled 2016-01-08 (×2): qty 1

## 2016-01-08 MED ORDER — METOPROLOL TARTRATE 50 MG PO TABS: 50.0000 mg | ORAL_TABLET | Freq: Two times a day (BID) | ORAL | Status: DC

## 2016-01-08 MED ORDER — METOPROLOL TARTRATE 25 MG PO TABS
25.0000 mg | ORAL_TABLET | Freq: Two times a day (BID) | ORAL | Status: DC
  Administered 2016-01-08: 25 mg via ORAL
  Filled 2016-01-08: qty 1

## 2016-01-08 MED ORDER — WARFARIN SODIUM 7.5 MG PO TABS: 7.5000 mg | ORAL_TABLET | Freq: Every day | ORAL | Status: DC

## 2016-01-08 NOTE — Progress Notes (Addendum)
      [address]       [place] [address]             [phone]      5 Days Post-Op Procedure(s) (LRB): AORTIC VALVE REPLACEMENT (AVR) (N/A) TRANSESOPHAGEAL ECHOCARDIOGRAM (TEE) (N/A) Subjective: Doing better today. No issues overnight.   Objective: Vital signs in last 24 hours: Temp:  [97.7 F (36.5 C)-98.7 F (37.1 C)] 98.4 F (36.9 C) (11/05 0450) Pulse Rate:  [82-108] 108 (11/05 0823) Cardiac Rhythm: Normal sinus rhythm (11/05 0700) Resp:  [18] 18 (11/05 0450) BP: (138-170)/(41-65) 160/59 (11/05 0823) SpO2:  [96 %-98 %] 96 % (11/05 0450) Weight:  [227 lb (103 kg)] 227 lb (103 kg) (11/05 0450)     Intake/Output from previous day: 11/04 0701 - 11/05 0700 In: 960 [P.O.:960] Out: 550 [Urine:550] Intake/Output this shift: Total I/O In: 360 [P.O.:360] Out: -   General appearance: alert, cooperative and no distress Heart: regular rate and rhythm and mechanical click Lungs: clear to auscultation bilaterally Abdomen: soft, non-tender; bowel sounds normal; no masses,  no organomegaly Extremities: edema 2+ edema in the lower extremity bilaterally Wound: c/d/i without drainage  Lab Results:  Recent Labs  01/06/16 0204  WBC 12.4*  HGB 9.3*  HCT 27.8*  PLT 223   BMET:  Recent Labs  01/06/16 0204 01/07/16 0349  NA 132* 133*  K 4.6 3.7  CL 100* 102  CO2 26 28  GLUCOSE 119* 111*  BUN 17 19  CREATININE 1.04* 1.03*  CALCIUM 8.9 8.7*    PT/INR:  Recent Labs  01/08/16 0234  LABPROT 14.1  INR 1.09   ABG    Component Value Date/Time   PHART 7.329 (L) 01/03/2016 1811   HCO3 21.3 01/03/2016 1811   TCO2 20 01/04/2016 1602   ACIDBASEDEF 4.0 (H) 01/03/2016 1811   O2SAT 99.0 01/03/2016 1811   CBG (last 3)   Recent Labs  01/07/16 1620 01/07/16 2042 01/08/16 0611  GLUCAP 291* 179* 109*    Assessment/Plan: S/P Procedure(s) (LRB): AORTIC VALVE REPLACEMENT (AVR) (N/A) TRANSESOPHAGEAL ECHOCARDIOGRAM (TEE) (N/A)  1. CV- NSR in  the 70s. BP remains elevated. On home dose ACEI. Will add home dose of Norvasc and titrate as required.  2. Pulm- on room air with good oxygen saturation. Encouraged hourly use of incentive spirometry.  3. Renal-stable creatinine 1.03, Replace potassium. Weight trending down.  4. Endocrine-moderate control. Continue SSI and home medications added. 5. H and H stable 6. Anticoagulation-Coumadin 10mg  today. INR 1.09, mechanical valve.   Plan: Add home dose Norvasc. Continue diuretics for fluid overload. Coumadin 10mg  today, trend INR.    LOS: 5 days    Elgie Collard 01/08/2016   Chart reviewed, patient examined, agree with above. Feels well. Waiting on INR to rise. Plan 10 mg Coumadin tonight.

## 2016-01-09 LAB — GLUCOSE, CAPILLARY
GLUCOSE-CAPILLARY: 119 mg/dL — AB (ref 65–99)
GLUCOSE-CAPILLARY: 262 mg/dL — AB (ref 65–99)
GLUCOSE-CAPILLARY: 94 mg/dL (ref 65–99)
Glucose-Capillary: 93 mg/dL (ref 65–99)
Glucose-Capillary: 69 mg/dL (ref 65–99)

## 2016-01-09 LAB — BASIC METABOLIC PANEL
ANION GAP: 7 (ref 5–15)
BUN: 17 mg/dL (ref 6–20)
CO2: 30 mmol/L (ref 22–32)
Calcium: 9.1 mg/dL (ref 8.9–10.3)
Chloride: 102 mmol/L (ref 101–111)
Creatinine, Ser: 0.98 mg/dL (ref 0.44–1.00)
GLUCOSE: 97 mg/dL (ref 65–99)
POTASSIUM: 4.6 mmol/L (ref 3.5–5.1)
SODIUM: 139 mmol/L (ref 135–145)

## 2016-01-09 LAB — PROTIME-INR
INR: 1.26
Prothrombin Time: 15.9 seconds — ABNORMAL HIGH (ref 11.4–15.2)

## 2016-01-09 MED ORDER — WARFARIN SODIUM 10 MG PO TABS
10.0000 mg | ORAL_TABLET | Freq: Once | ORAL | Status: DC
  Administered 2016-01-09 – 2016-01-10 (×2): 10 mg via ORAL
  Filled 2016-01-09 (×2): qty 1

## 2016-01-09 MED ORDER — METOPROLOL TARTRATE 25 MG PO TABS
37.5000 mg | ORAL_TABLET | Freq: Two times a day (BID) | ORAL | Status: DC
  Administered 2016-01-09 (×2): 37.5 mg via ORAL
  Filled 2016-01-09 (×2): qty 1

## 2016-01-09 MED ORDER — FUROSEMIDE 40 MG PO TABS
40.0000 mg | ORAL_TABLET | Freq: Every day | ORAL | Status: DC
  Administered 2016-01-09 – 2016-01-10 (×2): 40 mg via ORAL
  Filled 2016-01-09 (×2): qty 1

## 2016-01-09 MED ORDER — AMLODIPINE BESYLATE 10 MG PO TABS
10.0000 mg | ORAL_TABLET | Freq: Every day | ORAL | Status: DC
  Administered 2016-01-09 – 2016-01-11 (×3): 10 mg via ORAL
  Filled 2016-01-09 (×3): qty 1

## 2016-01-09 NOTE — Progress Notes (Addendum)
[address]       [place] [address]             [phone]      6 Days Post-Op Procedure(s) (LRB): AORTIC VALVE REPLACEMENT (AVR) (N/A) TRANSESOPHAGEAL ECHOCARDIOGRAM (TEE) (N/A) Subjective: Feels well   Objective: Vital signs in last 24 hours: Temp:  [98.2 F (36.8 C)-98.6 F (37 C)] 98.3 F (36.8 C) (11/06 0442) Pulse Rate:  [84-108] 84 (11/06 0442) Cardiac Rhythm: Normal sinus rhythm (11/05 2039) Resp:  [18] 18 (11/06 0442) BP: (130-182)/(42-69) 152/69 (11/06 0442) SpO2:  [98 %-99 %] 98 % (11/06 0442) Weight:  [223 lb 14.4 oz (101.6 kg)] 223 lb 14.4 oz (101.6 kg) (11/06 0442)  Hemodynamic parameters for last 24 hours:    Intake/Output from previous day: 11/05 0701 - 11/06 0700 In: 360 [P.O.:360] Out: -  Intake/Output this shift: No intake/output data recorded.  General appearance: alert, cooperative and no distress Heart: regular rate and rhythm, ejection click present and soft syst murmur Lungs: min dim in bases Abdomen: benign Extremities: + edema Wound: incis healing well  Lab Results: No results for input(s): WBC, HGB, HCT, PLT in the last 72 hours. BMET:  Recent Labs  01/07/16 0349 01/09/16 0414  NA 133* 139  K 3.7 4.6  CL 102 102  CO2 28 30  GLUCOSE 111* 97  BUN 19 17  CREATININE 1.03* 0.98  CALCIUM 8.7* 9.1    PT/INR:  Recent Labs  01/09/16 0414  LABPROT 15.9*  INR 1.26   ABG    Component Value Date/Time   PHART 7.329 (L) 01/03/2016 1811   HCO3 21.3 01/03/2016 1811   TCO2 20 01/04/2016 1602   ACIDBASEDEF 4.0 (H) 01/03/2016 1811   O2SAT 99.0 01/03/2016 1811   CBG (last 3)   Recent Labs  01/08/16 1612 01/08/16 2149 01/09/16 0632  GLUCAP 219* 104* 119*    Meds Scheduled Meds: . amLODipine  2.5 mg Oral Daily  . aspirin EC  81 mg Oral Daily  . docusate sodium  200 mg Oral Daily  . enoxaparin (LOVENOX) injection  40 mg Subcutaneous QHS  . famotidine  20 mg Oral BID  . glyBURIDE  5 mg Oral BID  WC  . insulin aspart  0-24 Units Subcutaneous TID AC & HS  . insulin detemir  30 Units Subcutaneous Daily  . lisinopril  20 mg Oral Daily  . metFORMIN  1,000 mg Oral BID WC  . metoprolol tartrate  25 mg Oral BID  . moving right along book   Does not apply Once  . potassium chloride  20 mEq Oral Daily  . sodium chloride flush  3 mL Intravenous Q12H  . warfarin  10 mg Oral Daily  . Warfarin - Physician Dosing Inpatient   Does not apply q1800   Continuous Infusions: PRN Meds:.sodium chloride, acetaminophen, bisacodyl **OR** bisacodyl, ondansetron **OR** ondansetron (ZOFRAN) IV, oxyCODONE, polyethylene glycol, sodium chloride flush, traMADol  Xrays No results found.  Assessment/Plan: S/P Procedure(s) (LRB): AORTIC VALVE REPLACEMENT (AVR) (N/A) TRANSESOPHAGEAL ECHOCARDIOGRAM (TEE) (N/A)  1 doing well 2 cont 10 of coumadin - slow upward trend 3 sugars quite variable- monitor and cont current rx for now 4 cont rehab/pulm toilet 5 frequent pvc's - increase beta blocker, also increase  norvasc for HTN 6 cont diuretic for some volume overload  LOS: 6 days    GOLD,WAYNE E 01/09/2016   Chart reviewed, patient examined, agree with above. INR starting to rise on 10  mg dose of Coumadin. Home when INR about 2

## 2016-01-09 NOTE — Discharge Summary (Signed)
Physician Discharge Summary  Patient ID: [name] MRN: [id number] DOB/AGE: August 07, 1955 60 y.o.  Admit date: 01/03/2016 Discharge date: 01/11/2016  Admission Diagnoses:  Patient Active Problem List   Diagnosis Date Noted  . Insomnia 06/15/2015  . Thyroid nodule 09/15/2012  . Renal insufficiency 09/15/2012  . Aortic stenosis   . Type 2 diabetes mellitus not at goal [facility])   . Hypertension   . Hyperlipidemia   . Restless leg syndrome   . Ruptured disk    Discharge Diagnoses:   Patient Active Problem List   Diagnosis Date Noted  . S/P AVR 01/03/2016  . Insomnia 06/15/2015  . Thyroid nodule 09/15/2012  . Renal insufficiency 09/15/2012  . Aortic stenosis   . Type 2 diabetes mellitus not at goal [facility])   . Hypertension   . Hyperlipidemia   . Restless leg syndrome   . Ruptured disk     Discharged Condition: good  History of Present Illness:  [name] is a 60 yo female with known history of Hypertension, Hyperlipidemia, Type II DM, and severe aortic stenosis.  She has been followed routinely with her most recent ECHO from September showing a bicuspid Aortic valve with calcified leaflets.  She had severe aortic stenosis.  CTA of the chest was obtained in October of this year and showed her aorta to not be enlarged.  It was felt she should have intervention on her Aortic Valve and she was referred to [facility] for surgical evaluation.  She was evaluated by Dr. Cyndia Bent at which time the patient admitted to feeling well overall, but does experience some shortness of breath with heavy exertion.  She denied chest pain.  It was felt surgical replacement of her aortic valve would be indicated.  The risks and benefits of the procedure were explained to the patient and she was agreeable to proceed.  Cardiac catheterization was done prior to proceeding with surgery and did not reveal significant CAD.    Hospital Course:   [name] presented to [facility] on 01/03/2016.  She was  taken to the operating room and underwent Aortic Valve Replacement with a 21 mm [employer]. [employer] mechanical valve.  She developed junctional tachycardia and was started on IV Amiodarone.  She tolerated the procedure without difficulty and was taken to the SICU in stable condition.  The patient was extubated the evening of surgery.  During her stay in the SICU the patient was taken off IV Amiodarone after converting to NSR post operatively.  She was started on Coumadin for her mechanical valve prosthesis.  Her chest tubes and arterial lines were removed without difficulty.  She was diuresed for hypervolemia.  She was maintaining NSR and felt medically stable for transfer to the step down unit on POD #2.  The patient continued to make progress.  Her temporary pacing wires were removed without difficulty.  She was Hypertensive and started on her home ACE inhibitor which was titrated as tolerated.  She responded slowly to coumadin.  Her INR is 2.10 and she will be discharged home on 10 mg of Coumadin daily.  Her INR needs to be kept therapeutic at 2.0-3.0.  She remained hypertensive and was started on Norvasc for additional BP control.  She developed diarrhea, which has improved.  C. Diff testing was obtained and was negative.  She was ambulating independently.  She is maintaining NSR.  She is felt medically stable for discharge home today.  Significant Diagnostic Studies: cardiac graphics:   Echocardiogram:  Left ventricle: The cavity size was normal. Wall thickness was   increased in a pattern of moderate LVH. Systolic function was   vigorous. The estimated ejection fraction was in the range of 65%   to 70%. Wall motion was normal; there were no regional wall   motion abnormalities. Doppler parameters are consistent with   abnormal left ventricular relaxation (grade 1 diastolic   dysfunction). - Aortic valve: Bicuspid; moderately calcified leaflets. Cusp   separation was reduced. There was  severe stenosis. Mean gradient   (S): 52 mm Hg. Peak gradient (S): 88 mm Hg. VTI ratio of LVOT to   aortic valve: 0.26. Valve area (VTI): 0.9 cm^2. - Left atrium: The atrium was at the upper limits of normal in   size. - Right atrium: Central venous pressure (est): 3 mm Hg. - Atrial septum: No defect or patent foramen ovale was identified. - Tricuspid valve: There was trivial regurgitation. - Pulmonary arteries: PA peak pressure: 27 mm Hg (S). - Pericardium, extracardiac: A prominent pericardial fat pad was   present.  Impressions:  - Moderate LVH with LVEF 65-70%. Grade 1 diastolic dysfunction.   Upper normal left atrial chamber size. Moderately calcified,   bicuspid aortic valve with evidence of severe stenosis as   outlined above. Trivial tricuspid regurgitation with PASP 27   mmHg.  Treatments: surgery:   1. Median Sternotomy 2. Extracorporeal circulation 3.   Aortic valve replacement using a 21 mm  St. Jude Regent mechanical valve.  Disposition: 01-Home or Self Care  Discharge Instructions    Amb Referral to Cardiac Rehabilitation    Complete by:  As directed    Diagnosis:  Valve Replacement   Valve:  Aortic      Discharge medications:  The patient has been discharged on:   1.Beta Blocker:  Yes [x   ]                              No   [   ]                              If No, reason:  2.Ace Inhibitor/ARB: Yes [ x  ]                                     No  [    ]                                     If No, reason:  3.Statin:   Yes [   ]                  No  [ x  ]                  If No, reason: No CAD  4.Shela CommonsLauro Regulus   ]                  No   [   ]                  If No, reason:      Medication List  STOP taking these medications   ibuprofen 200 MG tablet Commonly known as:  ADVIL,MOTRIN     TAKE these medications   ACCU-CHEK AVIVA PLUS test strip Generic drug:  glucose blood TEST DAILY AS DIRECTED.   ACCU-CHEK FASTCLIX LANCETS  Misc USE DAILY AS DIRECTED.   acetaminophen 325 MG tablet Commonly known as:  TYLENOL Take 2 tablets (650 mg total) by mouth every 6 (six) hours as needed for mild pain.   amLODipine 10 MG tablet Commonly known as:  NORVASC Take 1 tablet (10 mg total) by mouth daily. What changed:  medication strength  how much to take   aspirin 81 MG tablet Take 81 mg by mouth daily.   B-D UF III MINI PEN NEEDLES 31G X 5 MM Misc Generic drug:  Insulin Pen Needle USE AS DIRECTED.   empagliflozin 10 MG Tabs tablet Commonly known as:  JARDIANCE Take 10 mg by mouth daily.   glyBURIDE 5 MG tablet Commonly known as:  DIABETA TAKE 1 tablet by mouth every morning and 1 tablet with supper. What changed:  how much to take  how to take this  when to take this  additional instructions   guaiFENesin 600 MG 12 hr tablet Commonly known as:  MUCINEX Take 600 mg by mouth 2 (two) times daily as needed for cough (sinus congestion).   insulin detemir 100 UNIT/ML injection Commonly known as:  LEVEMIR Inject 0.24 mLs (24 Units total) into the skin at bedtime.   lisinopril 40 MG tablet Commonly known as:  PRINIVIL,ZESTRIL Take 1 tablet (40 mg total) by mouth daily. What changed:  medication strength  how much to take   metFORMIN 1000 MG tablet Commonly known as:  GLUCOPHAGE TAKE (1) TABLET BY MOUTH TWICE DAILY. What changed:  how much to take  how to take this  when to take this  additional instructions   metoprolol 50 MG tablet Commonly known as:  LOPRESSOR Take 1 tablet (50 mg total) by mouth 2 (two) times daily.   oxyCODONE 5 MG immediate release tablet Commonly known as:  Oxy IR/ROXICODONE Take 1-2 tablets (5-10 mg total) by mouth every 3 (three) hours as needed for severe pain.   pravastatin 20 MG tablet Commonly known as:  PRAVACHOL TAKE 1 TABLET BY MOUTH AT BEDTIME FOR CHOLESTEROL. What changed:  how much to take  how to take this  when to take  this  additional instructions   TOBRADEX ophthalmic solution Generic drug:  tobramycin-dexamethasone Place 1 drop into the right eye every 4 (four) hours while awake. Started drops on 12-28-15 will end on 01-01-16   traZODone 100 MG tablet Commonly known as:  DESYREL Take 1/2 to 1 tablet as needed for bedtime. What changed:  how much to take  how to take this  when to take this  reasons to take this  additional instructions   warfarin 10 MG tablet Commonly known as:  COUMADIN Take 1 tablet (10 mg total) by mouth one time only at 6 PM.      Follow-up [place], MD Follow up on 02/08/2016.   Specialty:  Cardiothoracic Surgery Why:  Appointment is at 11:00, please get CXR at 10:30 at [place] located on the first floor of our office building Contact information: [place] [address] [phone]        [facility] Follow up on 01/16/2016.   Specialty:  Cardiothoracic Surgery Why:  Appointment is at 10:00 Contact information: [place], [place], NP Follow up on 01/24/2016.   Specialties:  Nurse Practitioner, Radiology, Cardiology Why:  Appointment is at 2:10 pm Contact information: [place] [address] [phone]        Rozann Lesches, MD Follow up on 01/12/2016.   Specialty:  Cardiology Why:  For PT/INR draw, appointment is at 10:00  Contact information: [place] [address] [phone]           Signed: Ellwood Handler 01/11/2016, 7:50 AM

## 2016-01-09 NOTE — Progress Notes (Signed)
Pt ambulated 522ft on RA.  Pt tolerated well.

## 2016-01-09 NOTE — Progress Notes (Signed)
CARDIAC REHAB PHASE I   PRE:  Rate/Rhythm: 103 ST  BP:  Supine:   Sitting: 127/70  Standing:    SaO2: 97%RA  MODE:  Ambulation: 890 ft   POST:  Rate/Rhythm: 122  BP:  Supine:   Sitting: 182/74  Standing:    SaO2: 97-99%RA 0822-0906 Pt walked 890 ft on RA with steady gait. Tolerated well. BP elevated after walk. Pt does not need walker for home use. Completed ed with pt who voiced understanding. Gave diabetic diet and discussed carb counting which pt has not been doing. Encouraged her to watch sodium also. Has seen discharge video.  Referring to [place] Phase 2.   Graylon Good, RN BSN  01/09/2016 9:02 AM

## 2016-01-10 LAB — GLUCOSE, CAPILLARY
GLUCOSE-CAPILLARY: 124 mg/dL — AB (ref 65–99)
GLUCOSE-CAPILLARY: 169 mg/dL — AB (ref 65–99)
GLUCOSE-CAPILLARY: 59 mg/dL — AB (ref 65–99)
GLUCOSE-CAPILLARY: 71 mg/dL (ref 65–99)
Glucose-Capillary: 149 mg/dL — ABNORMAL HIGH (ref 65–99)
Glucose-Capillary: 193 mg/dL — ABNORMAL HIGH (ref 65–99)
Glucose-Capillary: 116 mg/dL — ABNORMAL HIGH (ref 65–99)
Glucose-Capillary: 85 mg/dL (ref 65–99)

## 2016-01-10 LAB — BASIC METABOLIC PANEL
Anion gap: 12 (ref 5–15)
BUN: 17 mg/dL (ref 6–20)
CALCIUM: 9.1 mg/dL (ref 8.9–10.3)
CO2: 26 mmol/L (ref 22–32)
Chloride: 99 mmol/L — ABNORMAL LOW (ref 101–111)
Creatinine, Ser: 1.04 mg/dL — ABNORMAL HIGH (ref 0.44–1.00)
GFR calc Af Amer: >60 mL/min (ref >60)
GFR, EST NON AFRICAN AMERICAN: 57 mL/min — AB (ref >60)
GLUCOSE: 176 mg/dL — AB (ref 65–99)
Potassium: 4.2 mmol/L (ref 3.5–5.1)
Sodium: 137 mmol/L (ref 135–145)

## 2016-01-10 LAB — C DIFFICILE QUICK SCREEN W PCR REFLEX
C DIFFICILE (CDIFF) TOXIN: NEGATIVE
C DIFFICLE (CDIFF) ANTIGEN: NEGATIVE
C Diff interpretation: NOT DETECTED

## 2016-01-10 LAB — PROTIME-INR
INR: 1.62
PROTHROMBIN TIME: 19.5 s — AB (ref 11.4–15.2)

## 2016-01-10 MED ORDER — DEXTROSE 50 % IV SOLN
INTRAVENOUS | Status: AC
  Administered 2016-01-10: 25 mL
  Filled 2016-01-10: qty 50

## 2016-01-10 MED ORDER — INFLUENZA VAC SPLIT QUAD 0.5 ML IM SUSY
0.5000 mL | PREFILLED_SYRINGE | INTRAMUSCULAR | Status: AC
  Administered 2016-01-10: 0.5 mL via INTRAMUSCULAR
  Filled 2016-01-10: qty 0.5

## 2016-01-10 MED ORDER — PNEUMOCOCCAL VAC POLYVALENT 25 MCG/0.5ML IJ INJ
0.5000 mL | INJECTION | INTRAMUSCULAR | Status: AC
  Administered 2016-01-10: 0.5 mL via INTRAMUSCULAR
  Filled 2016-01-10: qty 0.5

## 2016-01-10 MED ORDER — METOPROLOL TARTRATE 50 MG PO TABS
50.0000 mg | ORAL_TABLET | Freq: Two times a day (BID) | ORAL | Status: DC
  Administered 2016-01-10 – 2016-01-11 (×3): 50 mg via ORAL
  Filled 2016-01-10 (×3): qty 1

## 2016-01-10 MED ORDER — LISINOPRIL 40 MG PO TABS
40.0000 mg | ORAL_TABLET | Freq: Every day | ORAL | Status: DC
  Administered 2016-01-10 – 2016-01-11 (×2): 40 mg via ORAL
  Filled 2016-01-10 (×2): qty 1

## 2016-01-10 NOTE — Progress Notes (Signed)
CARDIAC REHAB PHASE I   PRE:  Rate/Rhythm: 75 SR  BP:  Supine:   Sitting: 116/66  Standing:    SaO2: 100%RA  MODE:  Ambulation: 890 ft   POST:  Rate/Rhythm: 82 SR  BP:  Supine:   Sitting: 127/53  Standing:    SaO2: 99%RA 1435-1512 Pt walked 890 ft on RA with steady gait. Tolerated well. No complaints.   Graylon Good, RN BSN  01/10/2016 3:09 PM

## 2016-01-10 NOTE — Progress Notes (Signed)
CBG at 20:40 was 69.  Frozen icy given, recheck 94.  CBG checked again at 00:13 was 59.  25 cc IV dextrose given, recheck 149.  RN will continue to monitor pt closely.  Claudette Stapler, RN

## 2016-01-10 NOTE — Progress Notes (Addendum)
[address]       [place] [address]             [phone]      7 Days Post-Op Procedure(s) (LRB): AORTIC VALVE REPLACEMENT (AVR) (N/A) TRANSESOPHAGEAL ECHOCARDIOGRAM (TEE) (N/A) Subjective: Feels pretty well currently, sugar dropped to 50's yesterday and felt poorly for a little while Some diarrhea- appears to be resolving  Objective: Vital signs in last 24 hours: Temp:  [97.4 F (36.3 C)-98.6 F (37 C)] 97.4 F (36.3 C) (11/07 0512) Pulse Rate:  [77-102] 77 (11/07 0512) Cardiac Rhythm: Normal sinus rhythm (11/06 2030) Resp:  [18-20] 18 (11/07 0512) BP: (144-155)/(49-71) 150/54 (11/07 0512) SpO2:  [99 %-100 %] 99 % (11/07 0512) Weight:  [223 lb (101.2 kg)] 223 lb (101.2 kg) (11/07 0512)  Hemodynamic parameters for last 24 hours:    Intake/Output from previous day: 11/06 0701 - 11/07 0700 In: 720 [P.O.:720] Out: -  Intake/Output this shift: No intake/output data recorded.  General appearance: alert, cooperative and no distress Heart: regular rate and rhythm and + valve click, soft systolic murmur Lungs: clear to auscultation bilaterally Abdomen: benign Extremities: minor edema Wound: incis healing well  Lab Results: No results for input(s): WBC, HGB, HCT, PLT in the last 72 hours. BMET:  Recent Labs  01/09/16 0414 01/10/16 0318  NA 139 137  K 4.6 4.2  CL 102 99*  CO2 30 26  GLUCOSE 97 176*  BUN 17 17  CREATININE 0.98 1.04*  CALCIUM 9.1 9.1    PT/INR:  Recent Labs  01/10/16 0318  LABPROT 19.5*  INR 1.62   ABG    Component Value Date/Time   PHART 7.329 (L) 01/03/2016 1811   HCO3 21.3 01/03/2016 1811   TCO2 20 01/04/2016 1602   ACIDBASEDEF 4.0 (H) 01/03/2016 1811   O2SAT 99.0 01/03/2016 1811   CBG (last 3)   Recent Labs  01/10/16 0104 01/10/16 0339 01/10/16 0631  GLUCAP 149* 193* 124*    Meds Scheduled Meds: . amLODipine  10 mg Oral Daily  . aspirin EC  81 mg Oral Daily  . docusate sodium  200 mg Oral  Daily  . enoxaparin (LOVENOX) injection  40 mg Subcutaneous QHS  . famotidine  20 mg Oral BID  . furosemide  40 mg Oral Daily  . glyBURIDE  5 mg Oral BID WC  . Influenza vac split quadrivalent PF  0.5 mL Intramuscular Tomorrow-1000  . insulin aspart  0-24 Units Subcutaneous TID AC & HS  . insulin detemir  30 Units Subcutaneous Daily  . lisinopril  20 mg Oral Daily  . metFORMIN  1,000 mg Oral BID WC  . metoprolol tartrate  37.5 mg Oral BID  . moving right along book   Does not apply Once  . pneumococcal 23 valent vaccine  0.5 mL Intramuscular Tomorrow-1000  . potassium chloride  20 mEq Oral Daily  . sodium chloride flush  3 mL Intravenous Q12H  . warfarin  10 mg Oral ONCE-1800  . Warfarin - Physician Dosing Inpatient   Does not apply q1800   Continuous Infusions: PRN Meds:.sodium chloride, acetaminophen, bisacodyl **OR** bisacodyl, ondansetron **OR** ondansetron (ZOFRAN) IV, oxyCODONE, polyethylene glycol, sodium chloride flush, traMADol  Xrays No results found.  Assessment/Plan: S/P Procedure(s) (LRB): AORTIC VALVE REPLACEMENT (AVR) (N/A) TRANSESOPHAGEAL ECHOCARDIOGRAM (TEE) (N/A)  1 cont to load coumadin- home when close to INR 2 2 monitor sugars on current rx for now 3 gentle diuresis, creat is stable 4  cont routine rehab 5 increase lisinopril/lopressor for HTN, . Ventricular ectopy improving  LOS: 7 days    GOLD,WAYNE E 01/10/2016  Chart reviewed, patient examined, agree with above. INR up to 1.6 today. Plan 10 mg Coumadin tonight. I suspect she will be close to 2 tomorrow.   She had a loose stool this am and was put on enteric precautions. C. Diff PCR ordered but she doesn't know if her loose stool this am was tested. She has not had any more stools since this am.  If her INR is close to 2 she can go home tomorrow.

## 2016-01-11 LAB — BASIC METABOLIC PANEL
Anion gap: 9 (ref 5–15)
BUN: 18 mg/dL (ref 6–20)
CALCIUM: 8.7 mg/dL — AB (ref 8.9–10.3)
CHLORIDE: 100 mmol/L — AB (ref 101–111)
CO2: 25 mmol/L (ref 22–32)
CREATININE: 1.05 mg/dL — AB (ref 0.44–1.00)
GFR calc non Af Amer: 56 mL/min — ABNORMAL LOW (ref >60)
GLUCOSE: 133 mg/dL — AB (ref 65–99)
Potassium: 3.9 mmol/L (ref 3.5–5.1)
Sodium: 134 mmol/L — ABNORMAL LOW (ref 135–145)

## 2016-01-11 LAB — GLUCOSE, CAPILLARY: Glucose-Capillary: 160 mg/dL — ABNORMAL HIGH (ref 65–99)

## 2016-01-11 LAB — PROTIME-INR
INR: 2.1
PROTHROMBIN TIME: 23.9 s — AB (ref 11.4–15.2)

## 2016-01-11 MED ORDER — LISINOPRIL 40 MG PO TABS: 40.0000 mg | ORAL_TABLET | Freq: Every day | ORAL | 3 refills | Status: DC

## 2016-01-11 MED ORDER — WARFARIN SODIUM 10 MG PO TABS
10.0000 mg | ORAL_TABLET | Freq: Once | ORAL | 3 refills | Status: DC
Start: 2016-01-11 — End: 2016-02-08

## 2016-01-11 MED ORDER — ACETAMINOPHEN 325 MG PO TABS: 650.0000 mg | ORAL_TABLET | Freq: Four times a day (QID) | ORAL | Status: DC | PRN

## 2016-01-11 MED ORDER — METOPROLOL TARTRATE 50 MG PO TABS: 50.0000 mg | ORAL_TABLET | Freq: Two times a day (BID) | ORAL | 3 refills | Status: DC

## 2016-01-11 MED ORDER — AMLODIPINE BESYLATE 10 MG PO TABS: 10.0000 mg | ORAL_TABLET | Freq: Every day | ORAL | 3 refills | Status: DC

## 2016-01-11 MED ORDER — OXYCODONE HCL 5 MG PO TABS: 5.0000 mg | ORAL_TABLET | ORAL | 0 refills | Status: DC | PRN

## 2016-01-11 NOTE — Progress Notes (Addendum)
      [address]       ,[place] [address]             [phone]      8 Days Post-Op Procedure(s) (LRB): AORTIC VALVE REPLACEMENT (AVR) (N/A) TRANSESOPHAGEAL ECHOCARDIOGRAM (TEE) (N/A)   Subjective:  Patient has no complaints.  Diarrhea is improved.  She is ready to go home.    Objective: Vital signs in last 24 hours: Temp:  [97.9 F (36.6 C)-98.4 F (36.9 C)] 98.4 F (36.9 C) (11/08 0407) Pulse Rate:  [78-84] 78 (11/08 0407) Cardiac Rhythm: Normal sinus rhythm (11/07 1900) BP: (138-157)/(47-62) 139/47 (11/08 0407) SpO2:  [94 %-100 %] 94 % (11/08 0407) Weight:  [222 lb 8 oz (100.9 kg)] 222 lb 8 oz (100.9 kg) (11/08 0407)  General appearance: alert, cooperative and no distress Heart: regular rate and rhythm Lungs: clear to auscultation bilaterally Abdomen: soft, non-tender; bowel sounds normal; no masses,  no organomegaly Extremities: edema trace Wound: clean and dry  Lab Results: No results for input(s): WBC, HGB, HCT, PLT in the last 72 hours. BMET:  Recent Labs  01/10/16 0318 01/11/16 0345  NA 137 134*  K 4.2 3.9  CL 99* 100*  CO2 26 25  GLUCOSE 176* 133*  BUN 17 18  CREATININE 1.04* 1.05*  CALCIUM 9.1 8.7*    PT/INR:  Recent Labs  01/11/16 0345  LABPROT 23.9*  INR 2.10   ABG    Component Value Date/Time   PHART 7.329 (L) 01/03/2016 1811   HCO3 21.3 01/03/2016 1811   TCO2 20 01/04/2016 1602   ACIDBASEDEF 4.0 (H) 01/03/2016 1811   O2SAT 99.0 01/03/2016 1811   CBG (last 3)   Recent Labs  01/10/16 1639 01/10/16 2051 01/11/16 0601  GLUCAP 116* 85 160*    Assessment/Plan: S/P Procedure(s) (LRB): AORTIC VALVE REPLACEMENT (AVR) (N/A) TRANSESOPHAGEAL ECHOCARDIOGRAM (TEE) (N/A)  1. CV- NSR, occasional PVCs- continue Lopressor, Lisinopril, Norvasc 2. INR 2.10, will continue coumadin at 10 mg daily 3. Pulm- no acute issues, continue IS 4. Renal- creatinine is stable at 1.05, weight is below baseline 5. Diarrhea-  improved, C. Diff negative 6. Dispo- patient stable, will d/c home today   LOS: 8 days    BARRETT, ERIN 01/11/2016   Chart reviewed, patient examined, agree with above. INR 2.1 this am. Agree with sending home on 10 mg Coumadin and will need INR checked Friday.

## 2016-01-11 NOTE — Progress Notes (Signed)
Discharge AVS meds taken today and those due this evening reviewed.  Follow-up appointments and when to call md reviewed.  D/C IV and TELE.  Questions and concerns addressed.   D/C home per orders.  °Jathan Balling C ° °

## 2016-01-12 ENCOUNTER — Ambulatory Visit (INDEPENDENT_AMBULATORY_CARE_PROVIDER_SITE_OTHER): Payer: Self-pay | Admitting: *Deleted

## 2016-01-12 DIAGNOSIS — Z5181 Encounter for therapeutic drug level monitoring: Secondary | ICD-10-CM

## 2016-01-12 DIAGNOSIS — Z952 Presence of prosthetic heart valve: Secondary | ICD-10-CM

## 2016-01-12 LAB — POCT INR: INR: 3.8

## 2016-01-16 ENCOUNTER — Ambulatory Visit (INDEPENDENT_AMBULATORY_CARE_PROVIDER_SITE_OTHER): Payer: BC Managed Care – PPO | Admitting: *Deleted

## 2016-01-16 ENCOUNTER — Ambulatory Visit (INDEPENDENT_AMBULATORY_CARE_PROVIDER_SITE_OTHER): Payer: Self-pay | Admitting: *Deleted

## 2016-01-16 DIAGNOSIS — Z4802 Encounter for removal of sutures: Secondary | ICD-10-CM

## 2016-01-16 DIAGNOSIS — Z5181 Encounter for therapeutic drug level monitoring: Secondary | ICD-10-CM

## 2016-01-16 DIAGNOSIS — I350 Nonrheumatic aortic (valve) stenosis: Secondary | ICD-10-CM

## 2016-01-16 DIAGNOSIS — Z952 Presence of prosthetic heart valve: Secondary | ICD-10-CM

## 2016-01-16 DIAGNOSIS — Z736 Limitation of activities due to disability: Secondary | ICD-10-CM

## 2016-01-16 LAB — POCT INR: INR: 3.9

## 2016-01-16 NOTE — Progress Notes (Signed)
[name] returns s/p AVR on 01/03/16, discharged 01/11/16. The sternal incision and two previous chest tube sites are very well healed. Sutures were easily removed. Diet and bowels are good. She is only taking Tylenol for discomfort. She is having her INR monitored by cardiology. She will return as scheduled with a CXR.

## 2016-01-23 ENCOUNTER — Ambulatory Visit (INDEPENDENT_AMBULATORY_CARE_PROVIDER_SITE_OTHER): Payer: BC Managed Care – PPO | Admitting: *Deleted

## 2016-01-23 ENCOUNTER — Encounter: Payer: Self-pay | Admitting: Adult Health

## 2016-01-23 ENCOUNTER — Ambulatory Visit (INDEPENDENT_AMBULATORY_CARE_PROVIDER_SITE_OTHER): Payer: BC Managed Care – PPO | Admitting: Adult Health

## 2016-01-23 VITALS — BP 122/64 | HR 65 | Ht 66.0 in | Wt 228.0 lb

## 2016-01-23 DIAGNOSIS — D508 Other iron deficiency anemias: Secondary | ICD-10-CM

## 2016-01-23 DIAGNOSIS — Z9889 Other specified postprocedural states: Secondary | ICD-10-CM | POA: Diagnosis not present

## 2016-01-23 DIAGNOSIS — Z5181 Encounter for therapeutic drug level monitoring: Secondary | ICD-10-CM | POA: Diagnosis not present

## 2016-01-23 DIAGNOSIS — I350 Nonrheumatic aortic (valve) stenosis: Secondary | ICD-10-CM | POA: Diagnosis not present

## 2016-01-23 DIAGNOSIS — Z952 Presence of prosthetic heart valve: Secondary | ICD-10-CM

## 2016-01-23 DIAGNOSIS — I10 Essential (primary) hypertension: Secondary | ICD-10-CM

## 2016-01-23 LAB — POCT INR: INR: 5.1

## 2016-01-23 NOTE — Progress Notes (Signed)
Cardiology Office Note   Date:  01/23/2016   ID:  [name], [name] [birth year], MRN [id number]  PCP:  Sallee Lange, MD  Cardiologist: [place]/  Jory Sims, NP   Chief Complaint  Patient presents with  . Cardiac Valve Problem    S/P AoV repair      History of Present Illness: [name] is a 60 y.o. female who presents for Posthospitalization follow-up after admission for aortic valve repair. The surgery was completed in the setting of severe aortic valve stenosis. The surgery was completed on 01/03/2016, she now has a St[employer], adult education, is on Coumadin therapy and has been seen by our Coumadin clinic today with an INR of 5.1. She did have some post operative junctional tachycardia and did have temporary amiodarone infusion. She was sent home on ACE inhibitor secondary to hypertension, continued on metoprolol, and amlodipine..  She comes today feeling well. She states she snapped back faster than she expected too. She still has some soreness at the sternotomy site but denies any severe pain. She has not yet been contacted by cardiac rehabilitation. She has had some days when she was very fatigued and tearful which passed quickly. She felt it was related to her postoperative course. She has not yet seen Dr. Cyndia Bent on follow-up.   Past Medical History:  Diagnosis Date  . Aortic stenosis    Mild 5/12  . Aortic stenosis   . Dyspnea    with exertion  . Essential hypertension, benign    takes Lisinopril daily  . Insomnia    takes Trazodone nightly as needed  . Mixed hyperlipidemia    takes Pravastatin daily  . Nocturia   . Numbness    both feet  . Restless leg syndrome   . Ruptured disk   . Type 2 diabetes mellitus (HCC)    takes Glyburide,Metformin,Jardiance,and Levemir daily.Average fasting blood sugar 120    Past Surgical History:  Procedure Laterality Date  . AORTIC VALVE REPLACEMENT N/A 01/03/2016   Procedure: AORTIC VALVE REPLACEMENT (AVR);   Surgeon: Gaye Pollack, MD;  Location: [facility];  Service: Open Heart Surgery;  Laterality: N/A;  . CARDIAC CATHETERIZATION N/A 12/16/2015   Procedure: Right/Left Heart Cath and Coronary Angiography;  Surgeon: Belva Crome, MD;  Location: [facility];  Service: Cardiovascular;  Laterality: N/A;  . COLONOSCOPY    . TEE WITHOUT CARDIOVERSION N/A 01/03/2016   Procedure: TRANSESOPHAGEAL ECHOCARDIOGRAM (TEE);  Surgeon: Gaye Pollack, MD;  Location: [facility];  Service: Open Heart Surgery;  Laterality: N/A;  . TOE SURGERY     left foot middle      Current Outpatient Prescriptions  Medication Sig Dispense Refill  . ACCU-CHEK AVIVA PLUS test strip TEST DAILY AS DIRECTED. 50 each 5  . ACCU-CHEK FASTCLIX LANCETS MISC USE DAILY AS DIRECTED. 102 each 5  . acetaminophen (TYLENOL) 325 MG tablet Take 2 tablets (650 mg total) by mouth every 6 (six) hours as needed for mild pain.    Marland Kitchen amLODipine (NORVASC) 10 MG tablet Take 1 tablet (10 mg total) by mouth daily. 30 tablet 3  . aspirin 81 MG tablet Take 81 mg by mouth daily.    . B-D UF III MINI PEN NEEDLES 31G X 5 MM MISC USE AS DIRECTED. 100 each 5  . empagliflozin (JARDIANCE) 10 MG TABS tablet Take 10 mg by mouth daily. 30 tablet 5  . glyBURIDE (DIABETA) 5 MG tablet TAKE 1 tablet by mouth every morning  and 1 tablet with supper. (Patient taking differently: Take 5 mg by mouth 2 (two) times daily with a meal. ) 60 tablet 5  . guaiFENesin (MUCINEX) 600 MG 12 hr tablet Take 600 mg by mouth 2 (two) times daily as needed for cough (sinus congestion).    . insulin detemir (LEVEMIR) 100 UNIT/ML injection Inject 0.24 mLs (24 Units total) into the skin at bedtime. 10 mL 5  . lisinopril (PRINIVIL,ZESTRIL) 40 MG tablet Take 1 tablet (40 mg total) by mouth daily. 30 tablet 3  . metFORMIN (GLUCOPHAGE) 1000 MG tablet TAKE (1) TABLET BY MOUTH TWICE DAILY. (Patient taking differently: Take 1,000 mg by mouth 2 (two) times daily with a meal. ) 60 tablet 5  . metoprolol  (LOPRESSOR) 50 MG tablet Take 1 tablet (50 mg total) by mouth 2 (two) times daily. 60 tablet 3  . pravastatin (PRAVACHOL) 20 MG tablet TAKE 1 TABLET BY MOUTH AT BEDTIME FOR CHOLESTEROL. (Patient taking differently: Take 20 mg by mouth daily. ) 30 tablet 5  . warfarin (COUMADIN) 10 MG tablet Take 1 tablet (10 mg total) by mouth one time only at 6 PM. 30 tablet 3  . oxyCODONE (OXY IR/ROXICODONE) 5 MG immediate release tablet Take 1-2 tablets (5-10 mg total) by mouth every 3 (three) hours as needed for severe pain. (Patient not taking: Reported on 01/23/2016) 30 tablet 0   No current facility-administered medications for this visit.     Allergies:   No known allergies    Social History:  The patient  reports that she has never smoked. She has never used smokeless tobacco. She reports that she does not drink alcohol or use drugs.   Family History:  The patient's family history includes Coronary artery disease in her father; Diabetes in her brother and mother; Diabetes type II in her brother and mother; Hyperlipidemia in her sister; Hypertension in her father, mother, and sister.    ROS: All other systems are reviewed and negative. Unless otherwise mentioned in H&P    PHYSICAL EXAM: VS:  BP 122/64   Pulse 65   Ht 5\' 6"  (1.676 m)   Wt 228 lb (103.4 kg)   SpO2 97%   BMI 36.80 kg/m  , BMI Body mass index is 36.8 kg/m. GEN: Well nourished, well developed, in no acute distress  HEENT: normal  Neck: no JVD, carotid bruits, or masses Cardiac: RRR; crisp click is noted at the left sternal border no murmurs, rubs, or gallops,no edema  Respiratory:  clear to auscultation bilaterally, normal work of breathing GI: soft, nontender, nondistended, + BS MS: no deformity or atrophy well healed sternotomy without evidence of infection bleeding or evisceration. Skin: warm and dry, no rash Neuro:  Strength and sensation are intact Psych: euthymic mood, full affect   Recent Labs: 12/30/2015: ALT  14 01/04/2016: Magnesium 1.9 01/06/2016: Hemoglobin 9.3; Platelets 223 01/11/2016: BUN 18; Creatinine, Ser 1.05; Potassium 3.9; Sodium 134    Lipid Panel    Component Value Date/Time   CHOL 141 08/30/2015 0840   TRIG 167 (H) 08/30/2015 0840   HDL 61 08/30/2015 0840   CHOLHDL 2.3 08/30/2015 0840   CHOLHDL 2.5 07/02/2014 0714   VLDL 35 07/02/2014 0714   LDLCALC 47 08/30/2015 0840      Wt Readings from Last 3 Encounters:  01/23/16 228 lb (103.4 kg)  01/11/16 222 lb 8 oz (100.9 kg)  12/30/15 226 lb 2 oz (102.6 kg)      ASSESSMENT AND PLAN:  1.  Aortic valve disease: Status post aortic valve repair with [employer] mechanical valve. She remains on beta blocker and Coumadin. She is without complaint. She will be referred to cardiac rehabilitation. No changes in her medication regimen. Coumadin is being adjusted based upon INR of 5.1. Follow-up CBC and BMET are ordered in 2 weeks and we will see her again in 6 weeks.  2. Hypertension: Blood pressure is currently very well-controlled. Continue amlodipine and lisinopril 40 mg daily. Follow-up BMET is ordered  3. Diabetes: Continue follow-up by primary care  4. Hypercholesterolemia: Continue statin therapy.   Current medicines are reviewed at length with the patient today.    Labs/ tests ordered today include: CBC and BMET  No orders of the defined types were placed in this encounter.    Disposition:   FU with 6 weeks.  Signed, Jory Sims, NP  01/23/2016 4:52 PM    [facility] [address], [place], [place] [address] Phone: [phone]; Fax: [phone]

## 2016-01-23 NOTE — Patient Instructions (Signed)
Your physician recommends that you schedule a follow-up appointment in: [place] physician recommends that you continue on your current medications as directed. Please refer to the Current Medication list given to you today.  You have been referred to Cardiac Rehab  Your physician recommends that you return for lab work in 2 Weeks (02/06/16)  If you need a refill on your cardiac medications before your next appointment, please call your pharmacy.  Thank you for choosing [facility]!

## 2016-01-23 NOTE — Progress Notes (Signed)
Name: [name]    DOB: 10/21/1955  Age: 60 y.o.  MR#: [id number]       PCP:  Sallee Lange, MD      Insurance: Payor: BLUE [place] / Plan: [place] PPO / Product Type: *No Product type* /   CC:   No chief complaint on file.   VS Vitals:   01/23/16 1611  BP: 122/64  Pulse: 65  SpO2: 97%  Weight: 228 lb (103.4 kg)  Height: 5\' 6"  (1.676 m)    Weights Current Weight  01/23/16 228 lb (103.4 kg)  01/11/16 222 lb 8 oz (100.9 kg)  12/30/15 226 lb 2 oz (102.6 kg)    Blood Pressure  BP Readings from Last 3 Encounters:  01/23/16 122/64  01/11/16 (!) 123/51  12/30/15 (!) 145/84     Admit date:  (Not on file) Last encounter with RMR:  Visit date not found   Allergy No known allergies  Current Outpatient Prescriptions  Medication Sig Dispense Refill  . ACCU-CHEK AVIVA PLUS test strip TEST DAILY AS DIRECTED. 50 each 5  . ACCU-CHEK FASTCLIX LANCETS MISC USE DAILY AS DIRECTED. 102 each 5  . acetaminophen (TYLENOL) 325 MG tablet Take 2 tablets (650 mg total) by mouth every 6 (six) hours as needed for mild pain.    Marland Kitchen amLODipine (NORVASC) 10 MG tablet Take 1 tablet (10 mg total) by mouth daily. 30 tablet 3  . aspirin 81 MG tablet Take 81 mg by mouth daily.    . B-D UF III MINI PEN NEEDLES 31G X 5 MM MISC USE AS DIRECTED. 100 each 5  . empagliflozin (JARDIANCE) 10 MG TABS tablet Take 10 mg by mouth daily. 30 tablet 5  . glyBURIDE (DIABETA) 5 MG tablet TAKE 1 tablet by mouth every morning and 1 tablet with supper. (Patient taking differently: Take 5 mg by mouth 2 (two) times daily with a meal. ) 60 tablet 5  . guaiFENesin (MUCINEX) 600 MG 12 hr tablet Take 600 mg by mouth 2 (two) times daily as needed for cough (sinus congestion).    . insulin detemir (LEVEMIR) 100 UNIT/ML injection Inject 0.24 mLs (24 Units total) into the skin at bedtime. 10 mL 5  . lisinopril (PRINIVIL,ZESTRIL) 40 MG tablet Take 1 tablet (40 mg total) by mouth daily. 30 tablet 3  . metFORMIN  (GLUCOPHAGE) 1000 MG tablet TAKE (1) TABLET BY MOUTH TWICE DAILY. (Patient taking differently: Take 1,000 mg by mouth 2 (two) times daily with a meal. ) 60 tablet 5  . metoprolol (LOPRESSOR) 50 MG tablet Take 1 tablet (50 mg total) by mouth 2 (two) times daily. 60 tablet 3  . pravastatin (PRAVACHOL) 20 MG tablet TAKE 1 TABLET BY MOUTH AT BEDTIME FOR CHOLESTEROL. (Patient taking differently: Take 20 mg by mouth daily. ) 30 tablet 5  . warfarin (COUMADIN) 10 MG tablet Take 1 tablet (10 mg total) by mouth one time only at 6 PM. 30 tablet 3  . oxyCODONE (OXY IR/ROXICODONE) 5 MG immediate release tablet Take 1-2 tablets (5-10 mg total) by mouth every 3 (three) hours as needed for severe pain. (Patient not taking: Reported on 01/23/2016) 30 tablet 0   No current facility-administered medications for this visit.     Discontinued Meds:    Medications Discontinued During This Encounter  Medication Reason  . traZODone (DESYREL) 100 MG tablet Error  . tobramycin-dexamethasone (TOBRADEX) ophthalmic solution Error    Patient Active Problem List   Diagnosis Date Noted  .  Encounter for therapeutic drug monitoring 01/12/2016  . S/P AVR 01/03/2016  . Insomnia 06/15/2015  . Thyroid nodule 09/15/2012  . Renal insufficiency 09/15/2012  . Aortic stenosis   . Type 2 diabetes mellitus not at goal [facility])   . Hypertension   . Hyperlipidemia   . Restless leg syndrome   . Ruptured disk     LABS    Component Value Date/Time   NA 134 (L) 01/11/2016 0345   NA 137 01/10/2016 0318   NA 139 01/09/2016 0414   NA 138 10/05/2015 1146   NA 138 08/30/2015 0840   K 3.9 01/11/2016 0345   K 4.2 01/10/2016 0318   K 4.6 01/09/2016 0414   CL 100 (L) 01/11/2016 0345   CL 99 (L) 01/10/2016 0318   CL 102 01/09/2016 0414   CO2 25 01/11/2016 0345   CO2 26 01/10/2016 0318   CO2 30 01/09/2016 0414   GLUCOSE 133 (H) 01/11/2016 0345   GLUCOSE 176 (H) 01/10/2016 0318   GLUCOSE 97 01/09/2016 0414   BUN 18 01/11/2016  0345   BUN 17 01/10/2016 0318   BUN 17 01/09/2016 0414   BUN 22 10/05/2015 1146   BUN 30 (H) 08/30/2015 0840   CREATININE 1.05 (H) 01/11/2016 0345   CREATININE 1.04 (H) 01/10/2016 0318   CREATININE 0.98 01/09/2016 0414   CREATININE 0.99 07/02/2014 0714   CREATININE 1.09 09/14/2013 0001   CREATININE 0.94 09/19/2012 1020   CALCIUM 8.7 (L) 01/11/2016 0345   CALCIUM 9.1 01/10/2016 0318   CALCIUM 9.1 01/09/2016 0414   GFRNONAA 56 (L) 01/11/2016 0345   GFRNONAA 57 (L) 01/10/2016 0318   GFRNONAA >60 01/09/2016 0414   GFRAA >60 01/11/2016 0345   GFRAA >60 01/10/2016 0318   GFRAA >60 01/09/2016 0414   CMP     Component Value Date/Time   NA 134 (L) 01/11/2016 0345   NA 138 10/05/2015 1146   K 3.9 01/11/2016 0345   CL 100 (L) 01/11/2016 0345   CO2 25 01/11/2016 0345   GLUCOSE 133 (H) 01/11/2016 0345   BUN 18 01/11/2016 0345   BUN 22 10/05/2015 1146   CREATININE 1.05 (H) 01/11/2016 0345   CREATININE 0.99 07/02/2014 0714   CALCIUM 8.7 (L) 01/11/2016 0345   PROT 6.9 12/30/2015 1308   PROT 7.0 08/30/2015 0840   ALBUMIN 3.9 12/30/2015 1308   ALBUMIN 4.4 08/30/2015 0840   AST 18 12/30/2015 1308   ALT 14 12/30/2015 1308   ALKPHOS 74 12/30/2015 1308   BILITOT 0.4 12/30/2015 1308   BILITOT 0.5 08/30/2015 0840   GFRNONAA 56 (L) 01/11/2016 0345   GFRAA >60 01/11/2016 0345       Component Value Date/Time   WBC 12.4 (H) 01/06/2016 0204   WBC 13.6 (H) 01/05/2016 0415   WBC 16.9 (H) 01/04/2016 1600   HGB 9.3 (L) 01/06/2016 0204   HGB 8.7 (L) 01/05/2016 0415   HGB 10.2 (L) 01/04/2016 1602   HCT 27.8 (L) 01/06/2016 0204   HCT 26.5 (L) 01/05/2016 0415   HCT 30.0 (L) 01/04/2016 1602   MCV 84.5 01/06/2016 0204   MCV 85.5 01/05/2016 0415   MCV 85.1 01/04/2016 1600    Lipid Panel     Component Value Date/Time   CHOL 141 08/30/2015 0840   TRIG 167 (H) 08/30/2015 0840   HDL 61 08/30/2015 0840   CHOLHDL 2.3 08/30/2015 0840   CHOLHDL 2.5 07/02/2014 0714   VLDL 35 07/02/2014 0714    LDLCALC 47 08/30/2015 0840  ABG    Component Value Date/Time   PHART 7.329 (L) 01/03/2016 1811   PCO2ART 40.5 01/03/2016 1811   PO2ART 156.0 (H) 01/03/2016 1811   HCO3 21.3 01/03/2016 1811   TCO2 20 01/04/2016 1602   ACIDBASEDEF 4.0 (H) 01/03/2016 1811   O2SAT 99.0 01/03/2016 1811     Lab Results  Component Value Date   TSH 1.958 09/14/2013   BNP (last 3 results) No results for input(s): BNP in the last 8760 hours.  ProBNP (last 3 results) No results for input(s): PROBNP in the last 8760 hours.  Cardiac Panel (last 3 results) No results for input(s): CKTOTAL, CKMB, TROPONINI, RELINDX in the last 72 hours.  Iron/TIBC/Ferritin/ %Sat No results found for: IRON, TIBC, FERRITIN, IRONPCTSAT   EKG Orders placed or performed during the hospital encounter of 01/03/16  . EKG 12-Lead  . EKG 12-Lead  . EKG 12-Lead  . EKG 12-Lead  . EKG 12-Lead  . EKG 12-Lead     Prior Assessment and Plan Problem List as of 01/23/2016 Reviewed: 01/02/2016  7:58 PM by Ivar Drape, CRNA     Cardiovascular and Mediastinum   Aortic stenosis   Last Assessment & Plan 09/10/2012 Office Visit Written 09/10/2012  3:13 PM by Satira Sark, MD    Probable bicuspid aortic valve as outlined associated with what looks to be mild dilatation of the ascending aorta based on limited images. The recent echocardiogram does suggest progression in degree of aortic stenosis, although visual inspection of the valve and actual gradients are not clearly concordant. As noted above, the increased gradients may have other explanations, and valve planimetry looks to be perhaps more in the moderate range overall. She does not report any decline in functional capacity or obvious exertional symptoms on a regular basis. We did discuss proceeding with a TEE, however she prefers observation at this point, followup planned in 6 months with a repeat echocardiogram. Also would like to go ahead and get a CT angiogram of the chest to  better size her aorta and exclude aneurysmal disease.      Hypertension   Last Assessment & Plan 09/10/2012 Office Visit Written 09/10/2012  3:13 PM by Satira Sark, MD    Blood pressure elevated today. Repeat regular followup with Dr. Wolfgang Phoenix.        Endocrine   Type 2 diabetes mellitus not at goal [facility])   Last Assessment & Plan 09/11/2010 Office Visit Written 09/11/2010 10:07 AM by Satira Sark, MD    Followed by Dr. Wolfgang Phoenix.      Thyroid nodule     Musculoskeletal and Integument   Ruptured disk     Genitourinary   Renal insufficiency     Other   Hyperlipidemia   Restless leg syndrome   Insomnia   S/P AVR   Encounter for therapeutic drug monitoring       Imaging: Dg Chest 2 View  Result Date: 12/30/2015 CLINICAL DATA:  Aortic valve stenosis. EXAM: CHEST  2 VIEW COMPARISON:  12/14/2015 FINDINGS: Heart and mediastinal contours are within normal limits. No focal opacities or effusions. No acute bony abnormality. IMPRESSION: No active cardiopulmonary disease. Electronically Signed   By: Rolm Baptise M.D.   On: 12/30/2015 14:31   Ct Chest Wo Contrast  Result Date: 12/29/2015 CLINICAL DATA:  Preop for ascending aortic replacement/AVR 01/03/2016 EXAM: CT CHEST WITHOUT CONTRAST TECHNIQUE: Multidetector CT imaging of the chest was performed following the standard protocol without IV contrast. COMPARISON:  Chest CT 09/12/2012 FINDINGS: Cardiovascular:  Fusiform enlargement of the ascending aorta with maximum measurement of 4.1 x 4.0 cm at the level of the right pulmonary artery. Dense calcification noted at the aortic valve. Minimal atherosclerotic calcifications at the aortic arch. Coronary artery calcifications are stable. Mediastinum/Nodes: No mediastinal or hilar mass or adenopathy. Small scattered lymph nodes are stable. The esophagus is grossly normal. Lungs/Pleura: No significant pulmonary findings. No acute pulmonary abnormality. No worrisome pulmonary lesions. No pleural  effusion. Upper Abdomen: No significant upper abdominal findings. Musculoskeletal: No significant bony findings. Suspect early changes of DISH or ankylosing spondylitis. Stable small lytic lesion in a mid thoracic vertebral body, unchanged since 2014. Advanced sternoclavicular joint degenerative changes, right greater than left. IMPRESSION: 1. Fusiform aneurysmal dilatation of the ascending aorta with maximum measurements of 4.1 x 4.0 cm. Dense calcification noted at the aortic valve. 2. No mediastinal or hilar mass or adenopathy. 3. No significant pulmonary findings. Electronically Signed   By: Marijo Sanes M.D.   On: 12/29/2015 12:49   Dg Chest Port 1 View  Result Date: 01/05/2016 CLINICAL DATA:  Status post aortic valve repair with chest pain EXAM: PORTABLE CHEST 1 VIEW COMPARISON:  01/04/2016 FINDINGS: Right jugular sheath remains. The Swan-Ganz catheter has been removed however. Mediastinal drain has also been removed as well as apparent pericardial drain. Small bilateral pleural effusions are seen. Some likely underlying atelectasis is present as well. No pneumothorax is seen. IMPRESSION: Small bilateral pleural effusions. Electronically Signed   By: Inez Catalina M.D.   On: 01/05/2016 07:50   Dg Chest Port 1 View  Result Date: 01/04/2016 CLINICAL DATA:  Post AVR EXAM: PORTABLE CHEST 1 VIEW COMPARISON:  Portable exam 0605 hours compared to 01/03/2016 FINDINGS: Interval removal of endotracheal and nasogastric tubes. RIGHT jugular Swan-Ganz catheter with tip projecting over bifurcation of main pulmonary artery, stable. Mediastinal drain present. Enlargement of cardiac silhouette post AVR. Mild pulmonary vascular congestion. Bibasilar atelectasis slightly greater on RIGHT. No gross infiltrate, pleural effusion, or pneumothorax. IMPRESSION: Bibasilar atelectasis. Electronically Signed   By: Lavonia Dana M.D.   On: 01/04/2016 07:57   Dg Chest Port 1 View  Result Date: 01/03/2016 CLINICAL DATA:  Status  post aortic valve replacement. EXAM: PORTABLE CHEST 1 VIEW COMPARISON:  12/30/2015 FINDINGS: An endotracheal tube is 4.7 cm above the carina. Nasogastric tube extends into the abdomen. Evidence for mediastinal chest tubes. Swan-Ganz catheter in the region of the proximal right pulmonary artery. Negative for pneumothorax. Low lung volumes. Mild blunting at the left costophrenic angle may represent atelectasis. Changes compatible with aortic valve surgery. IMPRESSION: Support apparatuses are appropriately positioned. Low lung volumes with left basilar atelectasis or small effusion. No pneumothorax. Electronically Signed   By: Markus Daft M.D.   On: 01/03/2016 12:40   Mm Screening Breast Tomo Bilateral  Result Date: 01/03/2016 CLINICAL DATA:  Screening. EXAM: 2D DIGITAL SCREENING BILATERAL MAMMOGRAM WITH CAD AND ADJUNCT TOMO COMPARISON:  Previous exam(s). ACR Breast Density Category a: The breast tissue is almost entirely fatty. FINDINGS: There are no findings suspicious for malignancy. Images were processed with CAD. IMPRESSION: No mammographic evidence of malignancy. A result letter of this screening mammogram will be mailed directly to the patient. RECOMMENDATION: Screening mammogram in one year. (Code:SM-B-01Y) BI-RADS CATEGORY  1: Negative. Electronically Signed   By: Altamese Cabal M.D.   On: 01/03/2016 13:59

## 2016-01-24 ENCOUNTER — Encounter: Payer: BC Managed Care – PPO | Admitting: Adult Health

## 2016-01-30 ENCOUNTER — Ambulatory Visit (INDEPENDENT_AMBULATORY_CARE_PROVIDER_SITE_OTHER): Payer: BC Managed Care – PPO | Admitting: *Deleted

## 2016-01-30 DIAGNOSIS — Z5181 Encounter for therapeutic drug level monitoring: Secondary | ICD-10-CM | POA: Diagnosis not present

## 2016-01-30 DIAGNOSIS — Z952 Presence of prosthetic heart valve: Secondary | ICD-10-CM

## 2016-01-30 LAB — POCT INR: INR: 2.6

## 2016-02-03 ENCOUNTER — Ambulatory Visit: Payer: BC Managed Care – PPO | Admitting: Family Medicine

## 2016-02-07 ENCOUNTER — Ambulatory Visit (INDEPENDENT_AMBULATORY_CARE_PROVIDER_SITE_OTHER): Payer: BC Managed Care – PPO | Admitting: Family Medicine

## 2016-02-07 ENCOUNTER — Other Ambulatory Visit: Payer: Self-pay | Admitting: Surgery

## 2016-02-07 ENCOUNTER — Encounter: Payer: Self-pay | Admitting: Family Medicine

## 2016-02-07 VITALS — BP 122/74 | Ht 66.0 in | Wt 224.0 lb

## 2016-02-07 DIAGNOSIS — E784 Other hyperlipidemia: Secondary | ICD-10-CM | POA: Diagnosis not present

## 2016-02-07 DIAGNOSIS — I10 Essential (primary) hypertension: Secondary | ICD-10-CM

## 2016-02-07 DIAGNOSIS — E119 Type 2 diabetes mellitus without complications: Secondary | ICD-10-CM | POA: Diagnosis not present

## 2016-02-07 DIAGNOSIS — Z952 Presence of prosthetic heart valve: Secondary | ICD-10-CM

## 2016-02-07 DIAGNOSIS — G2581 Restless legs syndrome: Secondary | ICD-10-CM

## 2016-02-07 DIAGNOSIS — E7849 Other hyperlipidemia: Secondary | ICD-10-CM

## 2016-02-07 MED ORDER — LORAZEPAM 1 MG PO TABS: ORAL_TABLET | ORAL | 4 refills | Status: DC

## 2016-02-07 MED ORDER — TRAZODONE HCL 100 MG PO TABS: 100.0000 mg | ORAL_TABLET | Freq: Every day | ORAL | 4 refills | Status: DC

## 2016-02-07 NOTE — Progress Notes (Signed)
   Subjective:    Patient ID: [name], female    DOB: [birth year], 60 y.o.   MRN: [id number]  Diabetes  She presents for her follow-up diabetic visit. She has type 2 diabetes mellitus. Pertinent negatives for hypoglycemia include no confusion. Associated symptoms include fatigue. Pertinent negatives for diabetes include no chest pain, no polydipsia, no polyphagia and no weakness. She is compliant with treatment all of the time. Exercise: walking some. Home blood sugar record trend: morning sugars good, afternoon around 265. She does not see a podiatrist.Eye exam is current.  A1C done in hospital on 12/30/15. Result 7.8.  Pt had heart surgery 5 weeks ago. Has INR done at cardiology.   Pt states no concerns today besides blood sugar.  medications were revewed in etail  Review of Systems  Constitutional: Positive for fatigue. Negative for activity change and appetite change.  HENT: Negative for congestion.   Respiratory: Negative for cough.   Cardiovascular: Negative for chest pain.  Gastrointestinal: Negative for abdominal pain.  Endocrine: Negative for polydipsia and polyphagia.  Neurological: Negative for weakness.  Psychiatric/Behavioral: Negative for confusion.       Objective:   Physical Exam  Constitutional: She appears well-nourished. No distress.  Cardiovascular: Normal rate, regular rhythm and normal heart sounds.   No murmur heard. Pulmonary/Chest: Effort normal and breath sounds normal. No respiratory distress.  Musculoskeletal: She exhibits no edema.  Lymphadenopathy:    She has no cervical adenopathy.  Neurological: She is alert. She exhibits normal muscle tone.  Psychiatric: Her behavior is normal.  Vitals reviewed.         Assessment & Plan:   diabetes-her recheck of the A1c actually shows that it's much better than what thought. I do stick wih everything asis If low ugar spels she is to notify us.  Blod pressure under god control.   restless legs  under good control currently  Patient was advised based on her previous surgery that when she gets dental work that she have antibiotics beforehand per protocol

## 2016-02-08 ENCOUNTER — Encounter: Payer: Self-pay | Admitting: Surgery

## 2016-02-08 ENCOUNTER — Ambulatory Visit (INDEPENDENT_AMBULATORY_CARE_PROVIDER_SITE_OTHER): Payer: Self-pay | Admitting: Surgery

## 2016-02-08 ENCOUNTER — Ambulatory Visit
Admission: RE | Admit: 2016-02-08 | Discharge: 2016-02-08 | Disposition: A | Discharge status: Home / Self Care | Payer: BC Managed Care – PPO | Source: Ambulatory Visit | Attending: Surgery | Admitting: Surgery

## 2016-02-08 ENCOUNTER — Ambulatory Visit (INDEPENDENT_AMBULATORY_CARE_PROVIDER_SITE_OTHER): Payer: BC Managed Care – PPO | Admitting: *Deleted

## 2016-02-08 VITALS — BP 134/76 | HR 74 | Resp 16 | Ht 66.0 in | Wt 224.0 lb

## 2016-02-08 DIAGNOSIS — Z952 Presence of prosthetic heart valve: Secondary | ICD-10-CM

## 2016-02-08 DIAGNOSIS — Z5181 Encounter for therapeutic drug level monitoring: Secondary | ICD-10-CM | POA: Diagnosis not present

## 2016-02-08 DIAGNOSIS — I350 Nonrheumatic aortic (valve) stenosis: Secondary | ICD-10-CM

## 2016-02-08 LAB — POCT INR: INR: 3.1

## 2016-02-08 LAB — POCT GLYCOSYLATED HEMOGLOBIN (HGB A1C): HEMOGLOBIN A1C: 7

## 2016-02-08 MED ORDER — WARFARIN SODIUM 5 MG PO TABS: 5.0000 mg | ORAL_TABLET | Freq: Once | ORAL | 3 refills | Status: DC

## 2016-02-08 NOTE — Progress Notes (Signed)
Hemoglobin A1c was resulted as 7.0

## 2016-02-12 ENCOUNTER — Telehealth: Payer: Self-pay | Admitting: Family Medicine

## 2016-02-12 ENCOUNTER — Encounter: Payer: Self-pay | Admitting: Surgery

## 2016-02-12 NOTE — Telephone Encounter (Signed)
[name], this patient was wondering whether or not she needed to use antibiotics before procedures/dental. I assume she would. The patient has an appointment with you in early January please cover this aspect with her thank you.

## 2016-02-12 NOTE — Progress Notes (Signed)
HPI: Patient returns for routine postoperative follow-up having undergone AVR with a 21 mm St Jude mechanical valve on 01/03/2016. The patient's early postoperative recovery while in the hospital was notable for an uncomplicated postop course. Since hospital discharge the patient reports she has been feeling well and is walking daily without chest pain, shortness of breath or dizziness. Her INR today was 3.1 on Coumadin 5 mg daily.   Current Outpatient Prescriptions  Medication Sig Dispense Refill  . ACCU-CHEK AVIVA PLUS test strip TEST DAILY AS DIRECTED. 50 each 5  . ACCU-CHEK FASTCLIX LANCETS MISC USE DAILY AS DIRECTED. 102 each 5  . acetaminophen (TYLENOL) 325 MG tablet Take 2 tablets (650 mg total) by mouth every 6 (six) hours as needed for mild pain.    Marland Kitchen amLODipine (NORVASC) 10 MG tablet Take 1 tablet (10 mg total) by mouth daily. 30 tablet 3  . aspirin 81 MG tablet Take 81 mg by mouth daily.    . B-D UF III MINI PEN NEEDLES 31G X 5 MM MISC USE AS DIRECTED. 100 each 5  . empagliflozin (JARDIANCE) 10 MG TABS tablet Take 10 mg by mouth daily. 30 tablet 5  . glyBURIDE (DIABETA) 5 MG tablet TAKE 1 tablet by mouth every morning and 1 tablet with supper. (Patient taking differently: Take 5 mg by mouth 2 (two) times daily with a meal. ) 60 tablet 5  . guaiFENesin (MUCINEX) 600 MG 12 hr tablet Take 600 mg by mouth 2 (two) times daily as needed for cough (sinus congestion).    . insulin detemir (LEVEMIR) 100 UNIT/ML injection Inject 0.24 mLs (24 Units total) into the skin at bedtime. 10 mL 5  . lisinopril (PRINIVIL,ZESTRIL) 40 MG tablet Take 1 tablet (40 mg total) by mouth daily. 30 tablet 3  . LORazepam (ATIVAN) 1 MG tablet 1/2 to 1 qhs prn sleep/restless legs 30 tablet 4  . pravastatin (PRAVACHOL) 20 MG tablet TAKE 1 TABLET BY MOUTH AT BEDTIME FOR CHOLESTEROL. (Patient taking differently: Take 20 mg by mouth daily. ) 30 tablet 5  . traZODone (DESYREL) 100 MG tablet Take 1 tablet (100 mg  total) by mouth at bedtime. 30 tablet 4  . warfarin (COUMADIN) 5 MG tablet Take 1 tablet (5 mg total) by mouth one time only at 6 PM. 90 tablet 3  . metFORMIN (GLUCOPHAGE) 1000 MG tablet TAKE (1) TABLET BY MOUTH TWICE DAILY. (Patient taking differently: Take 1,000 mg by mouth 2 (two) times daily with a meal. ) 60 tablet 5  . metoprolol (LOPRESSOR) 50 MG tablet Take 1 tablet (50 mg total) by mouth 2 (two) times daily. 60 tablet 3   No current facility-administered medications for this visit.     Physical Exam: BP 134/76 (BP Location: Right Arm, Patient Position: Sitting, Cuff Size: Large)   Pulse 74   Resp 16   Ht 5\' 6"  (1.676 m)   Wt 224 lb (101.6 kg)   SpO2 99% Comment: ON RA  BMI 36.15 kg/m  She looks well. Lung exam is clear. Cardiac exam shows a regular rate and rhythm with crisp mechanical heart sounds. Chest incision is healing well and sternum is stable. There is no peripheral edema.   Diagnostic Tests:  CLINICAL DATA:  Post aortic valve replacement.  EXAM: CHEST  2 VIEW  COMPARISON:  01/05/2016  FINDINGS: Prior aortic valve replacement. Heart is upper limits normal in size. Lungs are clear. No effusions or pneumothorax. No acute bony abnormality.  IMPRESSION: No active  cardiopulmonary disease.   Electronically Signed   By: Rolm Baptise M.D.   On: 02/08/2016 10:56  Impression:  Overall I think she is doing well. I encouraged her to continue walking. She is planning to participate in cardiac rehab. I told her that she could drive her car but should not lift anything heavier than 10 lbs for three months postop.   Plan:  She will continue to followup with cardiology and her INR will be monitored there. She will followup with Dr. Wolfgang Phoenix for her primary care. She will contact me if she has any problems with her incision.    Gaye Pollack, MD [facility] [phone]

## 2016-02-13 NOTE — Telephone Encounter (Signed)
Yes. She will need a Rx for Amoxicillin 2 gms po prior to procedure.

## 2016-02-13 NOTE — Telephone Encounter (Signed)
Notified patient that cardiology does in fact agree that she needs to be on antibiotics one hour before any dental procedure-in the future if she is having teeth cleaning etc. she can call us ahead of time in we will send in an antibiotic. Patient verbalized understanding.

## 2016-02-13 NOTE — Telephone Encounter (Signed)
Nurses please communicate with [name] that cardiology does in fact agree that she needs to be on antibiotics one hour before any dental procedure-in the future if she is having teeth cleaning etc. she can call us ahead of time in we will send in an antibiotic

## 2016-02-17 LAB — CBC WITH DIFFERENTIAL/PLATELET
BASOS ABS: 72 {cells}/uL (ref 0–200)
Basophils Relative: 1 %
EOS PCT: 5 %
Eosinophils Absolute: 360 cells/uL (ref 15–500)
HCT: 33.8 % — ABNORMAL LOW (ref 35.0–45.0)
Hemoglobin: 10.9 g/dL — ABNORMAL LOW (ref 11.7–15.5)
LYMPHS PCT: 37 %
Lymphs Abs: 2664 cells/uL (ref 850–3900)
MCH: 26.1 pg — AB (ref 27.0–33.0)
MCHC: 32.2 g/dL (ref 32.0–36.0)
MCV: 80.9 fL (ref 80.0–100.0)
MONOS PCT: 10 %
MPV: 8.5 fL (ref 7.5–12.5)
Monocytes Absolute: 720 cells/uL (ref 200–950)
NEUTROS PCT: 47 %
Neutro Abs: 3384 cells/uL (ref 1500–7800)
PLATELETS: 315 10*3/uL (ref 140–400)
RBC: 4.18 MIL/uL (ref 3.80–5.10)
RDW: 14.2 % (ref 11.0–15.0)
WBC: 7.2 10*3/uL (ref 3.8–10.8)

## 2016-02-18 LAB — BASIC METABOLIC PANEL
BUN: 24 mg/dL (ref 7–25)
CALCIUM: 9.5 mg/dL (ref 8.6–10.4)
CO2: 24 mmol/L (ref 20–31)
CREATININE: 1.02 mg/dL — AB (ref 0.50–0.99)
Chloride: 105 mmol/L (ref 98–110)
Glucose, Bld: 116 mg/dL — ABNORMAL HIGH (ref 65–99)
Potassium: 4.3 mmol/L (ref 3.5–5.3)
SODIUM: 137 mmol/L (ref 135–146)

## 2016-02-22 ENCOUNTER — Ambulatory Visit (INDEPENDENT_AMBULATORY_CARE_PROVIDER_SITE_OTHER): Payer: BC Managed Care – PPO | Admitting: *Deleted

## 2016-02-22 DIAGNOSIS — Z952 Presence of prosthetic heart valve: Secondary | ICD-10-CM

## 2016-02-22 DIAGNOSIS — Z5181 Encounter for therapeutic drug level monitoring: Secondary | ICD-10-CM | POA: Diagnosis not present

## 2016-02-22 LAB — POCT INR: INR: 3.8

## 2016-03-06 ENCOUNTER — Ambulatory Visit: Payer: BC Managed Care – PPO | Admitting: Adult Health

## 2016-03-06 NOTE — Progress Notes (Deleted)
Cardiology Office Note   Date:  03/06/2016   ID:  EABHA ORBE, DOB 19-Nov-1955, MRN [id number]  PCP:  Sallee Lange, MD  Cardiologist:  [place]/  Jory Sims, NP   No chief complaint on file.     History of Present Illness: [name] is a 61 y.o. female who presents for ongoing assessment and management of aortic valve stenosis, hypertension, hyperlipidemia, restless leg syndrome, and type 2 diabetes. The patient underwent surgical AVR October 2017, by Dr. Arvid Right. Remains on Coumadin therapy and is followed in our Coumadin clinic and [place]. She was seen in December 2017 by Dr. Cyndia Bent and released back to cardiology.    Past Medical History:  Diagnosis Date  . Aortic stenosis    Mild 5/12  . Aortic stenosis   . Dyspnea    with exertion  . Essential hypertension, benign    takes Lisinopril daily  . Insomnia    takes Trazodone nightly as needed  . Mixed hyperlipidemia    takes Pravastatin daily  . Nocturia   . Numbness    both feet  . Restless leg syndrome   . Ruptured disk   . Type 2 diabetes mellitus (HCC)    takes Glyburide,Metformin,Jardiance,and Levemir daily.Average fasting blood sugar 120    Past Surgical History:  Procedure Laterality Date  . AORTIC VALVE REPLACEMENT N/A 01/03/2016   Procedure: AORTIC VALVE REPLACEMENT (AVR);  Surgeon: Gaye Pollack, MD;  Location: [facility];  Service: Open Heart Surgery;  Laterality: N/A;  . CARDIAC CATHETERIZATION N/A 12/16/2015   Procedure: Right/Left Heart Cath and Coronary Angiography;  Surgeon: Belva Crome, MD;  Location: [facility];  Service: Cardiovascular;  Laterality: N/A;  . COLONOSCOPY    . TEE WITHOUT CARDIOVERSION N/A 01/03/2016   Procedure: TRANSESOPHAGEAL ECHOCARDIOGRAM (TEE);  Surgeon: Gaye Pollack, MD;  Location: [facility];  Service: Open Heart Surgery;  Laterality: N/A;  . TOE SURGERY     left foot middle      Current Outpatient Prescriptions  Medication Sig Dispense Refill  .  ACCU-CHEK AVIVA PLUS test strip TEST DAILY AS DIRECTED. 50 each 5  . ACCU-CHEK FASTCLIX LANCETS MISC USE DAILY AS DIRECTED. 102 each 5  . acetaminophen (TYLENOL) 325 MG tablet Take 2 tablets (650 mg total) by mouth every 6 (six) hours as needed for mild pain.    Marland Kitchen amLODipine (NORVASC) 10 MG tablet Take 1 tablet (10 mg total) by mouth daily. 30 tablet 3  . aspirin 81 MG tablet Take 81 mg by mouth daily.    . B-D UF III MINI PEN NEEDLES 31G X 5 MM MISC USE AS DIRECTED. 100 each 5  . empagliflozin (JARDIANCE) 10 MG TABS tablet Take 10 mg by mouth daily. 30 tablet 5  . glyBURIDE (DIABETA) 5 MG tablet TAKE 1 tablet by mouth every morning and 1 tablet with supper. (Patient taking differently: Take 5 mg by mouth 2 (two) times daily with a meal. ) 60 tablet 5  . guaiFENesin (MUCINEX) 600 MG 12 hr tablet Take 600 mg by mouth 2 (two) times daily as needed for cough (sinus congestion).    . insulin detemir (LEVEMIR) 100 UNIT/ML injection Inject 0.24 mLs (24 Units total) into the skin at bedtime. 10 mL 5  . lisinopril (PRINIVIL,ZESTRIL) 40 MG tablet Take 1 tablet (40 mg total) by mouth daily. 30 tablet 3  . LORazepam (ATIVAN) 1 MG tablet 1/2 to 1 qhs prn sleep/restless legs 30 tablet 4  .  metFORMIN (GLUCOPHAGE) 1000 MG tablet TAKE (1) TABLET BY MOUTH TWICE DAILY. (Patient taking differently: Take 1,000 mg by mouth 2 (two) times daily with a meal. ) 60 tablet 5  . metoprolol (LOPRESSOR) 50 MG tablet Take 1 tablet (50 mg total) by mouth 2 (two) times daily. 60 tablet 3  . pravastatin (PRAVACHOL) 20 MG tablet TAKE 1 TABLET BY MOUTH AT BEDTIME FOR CHOLESTEROL. (Patient taking differently: Take 20 mg by mouth daily. ) 30 tablet 5  . traZODone (DESYREL) 100 MG tablet Take 1 tablet (100 mg total) by mouth at bedtime. 30 tablet 4  . warfarin (COUMADIN) 5 MG tablet Take 1 tablet (5 mg total) by mouth one time only at 6 PM. 90 tablet 3   No current facility-administered medications for this visit.     Allergies:   No  known allergies    Social History:  The patient  reports that she has never smoked. She has never used smokeless tobacco. She reports that she does not drink alcohol or use drugs.   Family History:  The patient's family history includes Coronary artery disease in her father; Diabetes in her brother and mother; Diabetes type II in her brother and mother; Hyperlipidemia in her sister; Hypertension in her father, mother, and sister.    ROS: All other systems are reviewed and negative. Unless otherwise mentioned in H&P    PHYSICAL EXAM: VS:  There were no vitals taken for this visit. , BMI There is no height or weight on file to calculate BMI. GEN: Well nourished, well developed, in no acute distress  HEENT: normal  Neck: no JVD, carotid bruits, or masses Cardiac: ***RRR; no murmurs, rubs, or gallops,no edema  Respiratory:  clear to auscultation bilaterally, normal work of breathing GI: soft, nontender, nondistended, + BS MS: no deformity or atrophy  Skin: warm and dry, no rash Neuro:  Strength and sensation are intact Psych: euthymic mood, full affect   EKG:  EKG {ACTION; IS/IS [id number] ordered today. The ekg ordered today demonstrates ***   Recent Labs: 12/30/2015: ALT 14 01/04/2016: Magnesium 1.9 02/17/2016: BUN 24; Creat 1.02; Hemoglobin 10.9; Platelets 315; Potassium 4.3; Sodium 137    Lipid Panel    Component Value Date/Time   CHOL 141 08/30/2015 0840   TRIG 167 (H) 08/30/2015 0840   HDL 61 08/30/2015 0840   CHOLHDL 2.3 08/30/2015 0840   CHOLHDL 2.5 07/02/2014 0714   VLDL 35 07/02/2014 0714   LDLCALC 47 08/30/2015 0840      Wt Readings from Last 3 Encounters:  02/08/16 224 lb (101.6 kg)  02/07/16 224 lb (101.6 kg)  01/23/16 228 lb (103.4 kg)      Other studies Reviewed: Additional studies/ records that were reviewed today include: ***. Review of the above records demonstrates: ***   ASSESSMENT AND PLAN:  1.  ***   Current medicines are reviewed  at length with the patient today.    Labs/ tests ordered today include: *** No orders of the defined types were placed in this encounter.    Disposition:   FU with *** in {gen number [id number] {TIME; UNITS DAY/WEEK/MONTH:19136}   Signed, Jory Sims, NP  03/06/2016 6:44 AM    [facility] [address] [place], [place] [address] Phone: [phone]; Fax: [phone]

## 2016-03-12 ENCOUNTER — Ambulatory Visit (INDEPENDENT_AMBULATORY_CARE_PROVIDER_SITE_OTHER): Payer: BC Managed Care – PPO | Admitting: *Deleted

## 2016-03-12 DIAGNOSIS — Z952 Presence of prosthetic heart valve: Secondary | ICD-10-CM

## 2016-03-12 DIAGNOSIS — Z5181 Encounter for therapeutic drug level monitoring: Secondary | ICD-10-CM | POA: Diagnosis not present

## 2016-03-12 LAB — POCT INR: INR: 1.9

## 2016-03-15 ENCOUNTER — Ambulatory Visit: Payer: BC Managed Care – PPO | Admitting: Adult Health

## 2016-03-28 ENCOUNTER — Ambulatory Visit (INDEPENDENT_AMBULATORY_CARE_PROVIDER_SITE_OTHER): Payer: BC Managed Care – PPO | Admitting: *Deleted

## 2016-03-28 DIAGNOSIS — Z5181 Encounter for therapeutic drug level monitoring: Secondary | ICD-10-CM

## 2016-03-28 DIAGNOSIS — Z952 Presence of prosthetic heart valve: Secondary | ICD-10-CM | POA: Diagnosis not present

## 2016-03-28 LAB — POCT INR: INR: 3.6

## 2016-03-29 ENCOUNTER — Ambulatory Visit (INDEPENDENT_AMBULATORY_CARE_PROVIDER_SITE_OTHER): Payer: BC Managed Care – PPO | Admitting: Adult Health

## 2016-03-29 ENCOUNTER — Encounter: Payer: Self-pay | Admitting: Adult Health

## 2016-03-29 VITALS — BP 134/66 | HR 73 | Ht 66.0 in | Wt 227.0 lb

## 2016-03-29 DIAGNOSIS — I10 Essential (primary) hypertension: Secondary | ICD-10-CM | POA: Diagnosis not present

## 2016-03-29 DIAGNOSIS — Q230 Congenital stenosis of aortic valve: Secondary | ICD-10-CM

## 2016-03-29 DIAGNOSIS — Q231 Congenital insufficiency of aortic valve: Secondary | ICD-10-CM

## 2016-03-29 DIAGNOSIS — Z952 Presence of prosthetic heart valve: Secondary | ICD-10-CM

## 2016-03-29 NOTE — Patient Instructions (Signed)

## 2016-03-29 NOTE — Progress Notes (Signed)
Cardiology Office Note   Date:  03/29/2016   ID:  [name], [name] [birth year], MRN [id number]  PCP:  Sallee Lange, MD  Cardiologist: Delman Cheadle, NP   Chief Complaint  Patient presents with  . Cardiac Valve Problem  . Hypertension      History of Present Illness: [name] is a 61 y.o. female who presents for ongoing assessment and management of aortic valve stenosis status post aortic valve repair with a St. Jude Regent mechanical valve, on Coumadin therapy, other history includes dyspnea on exertion resolves with aortic valve repair, hypertension, hypercholesterolemia, type 2 diabetes. She was last seen in the office on 01/23/2016 and was clinically stable and without complaint. The patient has been seen by Dr. Cyndia Bent and was stable postoperatively as well. She is followed in our [facility] Coumadin clinic.  She is feeling very well and is without complaints. Most recent INR yesterday 3.6. She denies issues with bleeding, dyspnea or chest pain.   Past Medical History:  Diagnosis Date  . Aortic stenosis    Mild 5/12  . Aortic stenosis   . Dyspnea    with exertion  . Essential hypertension, benign    takes Lisinopril daily  . Insomnia    takes Trazodone nightly as needed  . Mixed hyperlipidemia    takes Pravastatin daily  . Nocturia   . Numbness    both feet  . Restless leg syndrome   . Ruptured disk   . Type 2 diabetes mellitus (HCC)    takes Glyburide,Metformin,Jardiance,and Levemir daily.Average fasting blood sugar 120    Past Surgical History:  Procedure Laterality Date  . AORTIC VALVE REPLACEMENT N/A 01/03/2016   Procedure: AORTIC VALVE REPLACEMENT (AVR);  Surgeon: Gaye Pollack, MD;  Location: [facility];  Service: Open Heart Surgery;  Laterality: N/A;  . CARDIAC CATHETERIZATION N/A 12/16/2015   Procedure: Right/Left Heart Cath and Coronary Angiography;  Surgeon: Belva Crome, MD;  Location: [facility];  Service: Cardiovascular;  Laterality:  N/A;  . COLONOSCOPY    . TEE WITHOUT CARDIOVERSION N/A 01/03/2016   Procedure: TRANSESOPHAGEAL ECHOCARDIOGRAM (TEE);  Surgeon: Gaye Pollack, MD;  Location: [facility];  Service: Open Heart Surgery;  Laterality: N/A;  . TOE SURGERY     left foot middle      Current Outpatient Prescriptions  Medication Sig Dispense Refill  . ACCU-CHEK AVIVA PLUS test strip TEST DAILY AS DIRECTED. 50 each 5  . ACCU-CHEK FASTCLIX LANCETS MISC USE DAILY AS DIRECTED. 102 each 5  . acetaminophen (TYLENOL) 325 MG tablet Take 2 tablets (650 mg total) by mouth every 6 (six) hours as needed for mild pain.    Marland Kitchen amLODipine (NORVASC) 10 MG tablet Take 1 tablet (10 mg total) by mouth daily. 30 tablet 3  . aspirin 81 MG tablet Take 81 mg by mouth daily.    . B-D UF III MINI PEN NEEDLES 31G X 5 MM MISC USE AS DIRECTED. 100 each 5  . empagliflozin (JARDIANCE) 10 MG TABS tablet Take 10 mg by mouth daily. 30 tablet 5  . glyBURIDE (DIABETA) 5 MG tablet TAKE 1 tablet by mouth every morning and 1 tablet with supper. (Patient taking differently: Take 5 mg by mouth 2 (two) times daily with a meal. ) 60 tablet 5  . guaiFENesin (MUCINEX) 600 MG 12 hr tablet Take 600 mg by mouth 2 (two) times daily as needed for cough (sinus congestion).    . insulin detemir (LEVEMIR) 100  UNIT/ML injection Inject 0.24 mLs (24 Units total) into the skin at bedtime. 10 mL 5  . lisinopril (PRINIVIL,ZESTRIL) 40 MG tablet Take 1 tablet (40 mg total) by mouth daily. 30 tablet 3  . LORazepam (ATIVAN) 1 MG tablet 1/2 to 1 qhs prn sleep/restless legs 30 tablet 4  . metFORMIN (GLUCOPHAGE) 1000 MG tablet TAKE (1) TABLET BY MOUTH TWICE DAILY. (Patient taking differently: Take 1,000 mg by mouth 2 (two) times daily with a meal. ) 60 tablet 5  . metoprolol (LOPRESSOR) 50 MG tablet Take 1 tablet (50 mg total) by mouth 2 (two) times daily. 60 tablet 3  . pravastatin (PRAVACHOL) 20 MG tablet TAKE 1 TABLET BY MOUTH AT BEDTIME FOR CHOLESTEROL. (Patient taking differently:  Take 20 mg by mouth daily. ) 30 tablet 5  . traZODone (DESYREL) 100 MG tablet Take 1 tablet (100 mg total) by mouth at bedtime. 30 tablet 4  . warfarin (COUMADIN) 5 MG tablet Take 1 tablet (5 mg total) by mouth one time only at 6 PM. 90 tablet 3   No current facility-administered medications for this visit.     Allergies:   No known allergies    Social History:  The patient  reports that she has never smoked. She has never used smokeless tobacco. She reports that she does not drink alcohol or use drugs.   Family History:  The patient's family history includes Coronary artery disease in her father; Diabetes in her brother and mother; Diabetes type II in her brother and mother; Hyperlipidemia in her sister; Hypertension in her father, mother, and sister.    ROS: All other systems are reviewed and negative. Unless otherwise mentioned in H&P    PHYSICAL EXAM: VS:  BP 134/66   Pulse 73   Ht 5\' 6"  (1.676 m)   Wt 227 lb (103 kg)   SpO2 98%   BMI 36.64 kg/m  , BMI Body mass index is 36.64 kg/m. GEN: Well nourished, well developed, in no acute distress  HEENT: normal  Neck: no JVD, carotid bruits, or masses Cardiac: RRR; crisp S2 click. no murmurs, rubs, or gallops,no edema  Respiratory:  \Clear to auscultation bilaterally, normal work of breathing GI: soft, nontender, nondistended, + BS MS: no deformity or atrophy  Skin: warm and dry, no rash Neuro:  Strength and sensation are intact Psych: euthymic mood, full affect   Recent Labs: 12/30/2015: ALT 14 01/04/2016: Magnesium 1.9 02/17/2016: BUN 24; Creat 1.02; Hemoglobin 10.9; Platelets 315; Potassium 4.3; Sodium 137    Lipid Panel    Component Value Date/Time   CHOL 141 08/30/2015 0840   TRIG 167 (H) 08/30/2015 0840   HDL 61 08/30/2015 0840   CHOLHDL 2.3 08/30/2015 0840   CHOLHDL 2.5 07/02/2014 0714   VLDL 35 07/02/2014 0714   LDLCALC 47 08/30/2015 0840      Wt Readings from Last 3 Encounters:  03/29/16 227 lb (103 kg)   02/08/16 224 lb (101.6 kg)  02/07/16 224 lb (101.6 kg)      Other studies Reviewed: Additional studies/ records that were reviewed today include: .11/09/2015  Left ventricle: The cavity size was normal. Wall thickness was   increased in a pattern of moderate LVH. Systolic function was   vigorous. The estimated ejection fraction was in the range of 65%   to 70%. Wall motion was normal; there were no regional wall   motion abnormalities. Doppler parameters are consistent with   abnormal left ventricular relaxation (grade 1 diastolic   dysfunction). -  Aortic valve: Bicuspid; moderately calcified leaflets. Cusp   separation was reduced. There was severe stenosis. Mean gradient   (S): 52 mm Hg. Peak gradient (S): 88 mm Hg. VTI ratio of LVOT to   aortic valve: 0.26. Valve area (VTI): 0.9 cm^2. - Left atrium: The atrium was at the upper limits of normal in   size. - Right atrium: Central venous pressure (est): 3 mm Hg. - Atrial septum: No defect or patent foramen ovale was identified. - Tricuspid valve: There was trivial regurgitation. - Pulmonary arteries: PA peak pressure: 27 mm Hg (S). - Pericardium, extracardiac: A prominent pericardial fat pad was   present.  Procedure: 01/03/2016  1. Median Sternotomy 2. Extracorporeal circulation 3.   Aortic valve replacement using a 21 mm  St. Jude Regent mechanical valve.  ASSESSMENT AND PLAN:  1.  Aortic Valve Repair: Doing well. She is not having any issues with coumadin. She continuing to be seen in the coumadin clinic. Will see her in 6 months. If issues arise, she can let us know when she is seen in coumadin clinic.   2. Hypertension: Good control. No changes in medication regimen. Continue metoprolol, amlodipine, and lisinopril.   3. Hypercholesterolemia: Continue statin therapy. Will have follow up labs on next visit unless PCP has completed.    Current medicines are reviewed at length with the patient today.    Labs/ tests  ordered today include:  No orders of the defined types were placed in this encounter.    Disposition:   FU with 6 month unless symptomatic.  Signed, Tina Sims, NP  03/29/2016 4:09 PM    [facility] [address] [place], [place] [address] Phone: [phone]; Fax: [phone]

## 2016-03-29 NOTE — Progress Notes (Signed)
Name: [name]    DOB: Jul 04, 1955  Age: 61 y.o.  MR#: [id number]       PCP:  Sallee Lange, MD      Insurance: Payor: BLUE [place] / Plan: [place] PPO / Product Type: *No Product type* /   CC:    Chief Complaint  Patient presents with  . Cardiac Valve Problem  . Hypertension    VS Vitals:   03/29/16 1556  BP: 134/66  Pulse: 73  SpO2: 98%  Weight: 227 lb (103 kg)  Height: 5\' 6"  (1.676 m)    Weights Current Weight  03/29/16 227 lb (103 kg)  02/08/16 224 lb (101.6 kg)  02/07/16 224 lb (101.6 kg)    Blood Pressure  BP Readings from Last 3 Encounters:  03/29/16 134/66  02/08/16 134/76  02/07/16 122/74     Admit date:  (Not on file) Last encounter with RMR:  01/23/2016   Allergy No known allergies  Current Outpatient Prescriptions  Medication Sig Dispense Refill  . ACCU-CHEK AVIVA PLUS test strip TEST DAILY AS DIRECTED. 50 each 5  . ACCU-CHEK FASTCLIX LANCETS MISC USE DAILY AS DIRECTED. 102 each 5  . acetaminophen (TYLENOL) 325 MG tablet Take 2 tablets (650 mg total) by mouth every 6 (six) hours as needed for mild pain.    Marland Kitchen amLODipine (NORVASC) 10 MG tablet Take 1 tablet (10 mg total) by mouth daily. 30 tablet 3  . aspirin 81 MG tablet Take 81 mg by mouth daily.    . B-D UF III MINI PEN NEEDLES 31G X 5 MM MISC USE AS DIRECTED. 100 each 5  . empagliflozin (JARDIANCE) 10 MG TABS tablet Take 10 mg by mouth daily. 30 tablet 5  . glyBURIDE (DIABETA) 5 MG tablet TAKE 1 tablet by mouth every morning and 1 tablet with supper. (Patient taking differently: Take 5 mg by mouth 2 (two) times daily with a meal. ) 60 tablet 5  . guaiFENesin (MUCINEX) 600 MG 12 hr tablet Take 600 mg by mouth 2 (two) times daily as needed for cough (sinus congestion).    . insulin detemir (LEVEMIR) 100 UNIT/ML injection Inject 0.24 mLs (24 Units total) into the skin at bedtime. 10 mL 5  . lisinopril (PRINIVIL,ZESTRIL) 40 MG tablet Take 1 tablet (40 mg total) by mouth daily. 30  tablet 3  . LORazepam (ATIVAN) 1 MG tablet 1/2 to 1 qhs prn sleep/restless legs 30 tablet 4  . metFORMIN (GLUCOPHAGE) 1000 MG tablet TAKE (1) TABLET BY MOUTH TWICE DAILY. (Patient taking differently: Take 1,000 mg by mouth 2 (two) times daily with a meal. ) 60 tablet 5  . metoprolol (LOPRESSOR) 50 MG tablet Take 1 tablet (50 mg total) by mouth 2 (two) times daily. 60 tablet 3  . pravastatin (PRAVACHOL) 20 MG tablet TAKE 1 TABLET BY MOUTH AT BEDTIME FOR CHOLESTEROL. (Patient taking differently: Take 20 mg by mouth daily. ) 30 tablet 5  . traZODone (DESYREL) 100 MG tablet Take 1 tablet (100 mg total) by mouth at bedtime. 30 tablet 4  . warfarin (COUMADIN) 5 MG tablet Take 1 tablet (5 mg total) by mouth one time only at 6 PM. 90 tablet 3   No current facility-administered medications for this visit.     Discontinued Meds:   There are no discontinued medications.  Patient Active Problem List   Diagnosis Date Noted  . Encounter for therapeutic drug monitoring 01/12/2016  . S/P AVR 01/03/2016  . Insomnia 06/15/2015  .  Thyroid nodule 09/15/2012  . Renal insufficiency 09/15/2012  . Aortic stenosis   . Type 2 diabetes mellitus not at goal [facility])   . Hypertension   . Hyperlipidemia   . Restless leg syndrome   . Ruptured disk     LABS    Component Value Date/Time   NA 137 02/17/2016 0843   NA 134 (L) 01/11/2016 0345   NA 137 01/10/2016 0318   NA 138 10/05/2015 1146   NA 138 08/30/2015 0840   K 4.3 02/17/2016 0843   K 3.9 01/11/2016 0345   K 4.2 01/10/2016 0318   CL 105 02/17/2016 0843   CL 100 (L) 01/11/2016 0345   CL 99 (L) 01/10/2016 0318   CO2 24 02/17/2016 0843   CO2 25 01/11/2016 0345   CO2 26 01/10/2016 0318   GLUCOSE 116 (H) 02/17/2016 0843   GLUCOSE 133 (H) 01/11/2016 0345   GLUCOSE 176 (H) 01/10/2016 0318   BUN 24 02/17/2016 0843   BUN 18 01/11/2016 0345   BUN 17 01/10/2016 0318   BUN 22 10/05/2015 1146   BUN 30 (H) 08/30/2015 0840   CREATININE 1.02 (H) 02/17/2016  0843   CREATININE 1.05 (H) 01/11/2016 0345   CREATININE 1.04 (H) 01/10/2016 0318   CREATININE 0.98 01/09/2016 0414   CREATININE 0.99 07/02/2014 0714   CREATININE 1.09 09/14/2013 0001   CALCIUM 9.5 02/17/2016 0843   CALCIUM 8.7 (L) 01/11/2016 0345   CALCIUM 9.1 01/10/2016 0318   GFRNONAA 56 (L) 01/11/2016 0345   GFRNONAA 57 (L) 01/10/2016 0318   GFRNONAA >60 01/09/2016 0414   GFRAA >60 01/11/2016 0345   GFRAA >60 01/10/2016 0318   GFRAA >60 01/09/2016 0414   CMP     Component Value Date/Time   NA 137 02/17/2016 0843   NA 138 10/05/2015 1146   K 4.3 02/17/2016 0843   CL 105 02/17/2016 0843   CO2 24 02/17/2016 0843   GLUCOSE 116 (H) 02/17/2016 0843   BUN 24 02/17/2016 0843   BUN 22 10/05/2015 1146   CREATININE 1.02 (H) 02/17/2016 0843   CALCIUM 9.5 02/17/2016 0843   PROT 6.9 12/30/2015 1308   PROT 7.0 08/30/2015 0840   ALBUMIN 3.9 12/30/2015 1308   ALBUMIN 4.4 08/30/2015 0840   AST 18 12/30/2015 1308   ALT 14 12/30/2015 1308   ALKPHOS 74 12/30/2015 1308   BILITOT 0.4 12/30/2015 1308   BILITOT 0.5 08/30/2015 0840   GFRNONAA 56 (L) 01/11/2016 0345   GFRAA >60 01/11/2016 0345       Component Value Date/Time   WBC 7.2 02/17/2016 0843   WBC 12.4 (H) 01/06/2016 0204   WBC 13.6 (H) 01/05/2016 0415   HGB 10.9 (L) 02/17/2016 0843   HGB 9.3 (L) 01/06/2016 0204   HGB 8.7 (L) 01/05/2016 0415   HCT 33.8 (L) 02/17/2016 0843   HCT 27.8 (L) 01/06/2016 0204   HCT 26.5 (L) 01/05/2016 0415   MCV 80.9 02/17/2016 0843   MCV 84.5 01/06/2016 0204   MCV 85.5 01/05/2016 0415    Lipid Panel     Component Value Date/Time   CHOL 141 08/30/2015 0840   TRIG 167 (H) 08/30/2015 0840   HDL 61 08/30/2015 0840   CHOLHDL 2.3 08/30/2015 0840   CHOLHDL 2.5 07/02/2014 0714   VLDL 35 07/02/2014 0714   LDLCALC 47 08/30/2015 0840    ABG    Component Value Date/Time   PHART 7.329 (L) 01/03/2016 1811   PCO2ART 40.5 01/03/2016 1811   PO2ART 156.0 (H)  01/03/2016 1811   HCO3 21.3 01/03/2016  1811   TCO2 20 01/04/2016 1602   ACIDBASEDEF 4.0 (H) 01/03/2016 1811   O2SAT 99.0 01/03/2016 1811     Lab Results  Component Value Date   TSH 1.958 09/14/2013   BNP (last 3 results) No results for input(s): BNP in the last 8760 hours.  ProBNP (last 3 results) No results for input(s): PROBNP in the last 8760 hours.  Cardiac Panel (last 3 results) No results for input(s): CKTOTAL, CKMB, TROPONINI, RELINDX in the last 72 hours.  Iron/TIBC/Ferritin/ %Sat No results found for: IRON, TIBC, FERRITIN, IRONPCTSAT   EKG Orders placed or performed during the hospital encounter of 01/03/16  . EKG 12-Lead  . EKG 12-Lead  . EKG 12-Lead  . EKG 12-Lead  . EKG 12-Lead  . EKG 12-Lead     Prior Assessment and Plan Problem List as of 03/29/2016 Reviewed: 02/12/2016 11:25 AM by Gaye Pollack, MD     Cardiovascular and Mediastinum   Aortic stenosis   Last Assessment & Plan 09/10/2012 Office Visit Written 09/10/2012  3:13 PM by Satira Sark, MD    Probable bicuspid aortic valve as outlined associated with what looks to be mild dilatation of the ascending aorta based on limited images. The recent echocardiogram does suggest progression in degree of aortic stenosis, although visual inspection of the valve and actual gradients are not clearly concordant. As noted above, the increased gradients may have other explanations, and valve planimetry looks to be perhaps more in the moderate range overall. She does not report any decline in functional capacity or obvious exertional symptoms on a regular basis. We did discuss proceeding with a TEE, however she prefers observation at this point, followup planned in 6 months with a repeat echocardiogram. Also would like to go ahead and get a CT angiogram of the chest to better size her aorta and exclude aneurysmal disease.      Hypertension   Last Assessment & Plan 09/10/2012 Office Visit Written 09/10/2012  3:13 PM by Satira Sark, MD    Blood pressure  elevated today. Repeat regular followup with Dr. Wolfgang Phoenix.        Endocrine   Type 2 diabetes mellitus not at goal [facility])   Last Assessment & Plan 09/11/2010 Office Visit Written 09/11/2010 10:07 AM by Satira Sark, MD    Followed by Dr. Wolfgang Phoenix.      Thyroid nodule     Musculoskeletal and Integument   Ruptured disk     Genitourinary   Renal insufficiency     Other   Hyperlipidemia   Restless leg syndrome   Insomnia   S/P AVR   Encounter for therapeutic drug monitoring       Imaging: No results found.

## 2016-04-09 ENCOUNTER — Other Ambulatory Visit: Payer: Self-pay | Admitting: Family Medicine

## 2016-04-11 ENCOUNTER — Ambulatory Visit (INDEPENDENT_AMBULATORY_CARE_PROVIDER_SITE_OTHER): Payer: BC Managed Care – PPO | Admitting: *Deleted

## 2016-04-11 DIAGNOSIS — Z952 Presence of prosthetic heart valve: Secondary | ICD-10-CM | POA: Diagnosis not present

## 2016-04-11 DIAGNOSIS — Z5181 Encounter for therapeutic drug level monitoring: Secondary | ICD-10-CM | POA: Diagnosis not present

## 2016-04-11 LAB — POCT INR: INR: 1.7

## 2016-04-21 ENCOUNTER — Other Ambulatory Visit: Payer: Self-pay | Admitting: Family Medicine

## 2016-04-25 ENCOUNTER — Ambulatory Visit (INDEPENDENT_AMBULATORY_CARE_PROVIDER_SITE_OTHER): Payer: BC Managed Care – PPO | Admitting: *Deleted

## 2016-04-25 DIAGNOSIS — Z5181 Encounter for therapeutic drug level monitoring: Secondary | ICD-10-CM | POA: Diagnosis not present

## 2016-04-25 DIAGNOSIS — Z952 Presence of prosthetic heart valve: Secondary | ICD-10-CM | POA: Diagnosis not present

## 2016-04-25 LAB — POCT INR: INR: 3

## 2016-05-03 ENCOUNTER — Other Ambulatory Visit: Payer: Self-pay | Admitting: Family Medicine

## 2016-05-12 ENCOUNTER — Other Ambulatory Visit: Payer: Self-pay | Admitting: Physician Assistant

## 2016-05-16 ENCOUNTER — Ambulatory Visit (INDEPENDENT_AMBULATORY_CARE_PROVIDER_SITE_OTHER): Payer: BC Managed Care – PPO | Admitting: *Deleted

## 2016-05-16 DIAGNOSIS — Z5181 Encounter for therapeutic drug level monitoring: Secondary | ICD-10-CM | POA: Diagnosis not present

## 2016-05-16 DIAGNOSIS — Z952 Presence of prosthetic heart valve: Secondary | ICD-10-CM | POA: Diagnosis not present

## 2016-05-16 LAB — POCT INR: INR: 2.6

## 2016-05-18 ENCOUNTER — Other Ambulatory Visit: Payer: Self-pay | Admitting: Physician Assistant

## 2016-05-24 ENCOUNTER — Other Ambulatory Visit: Payer: Self-pay | Admitting: Cardiology

## 2016-05-24 MED ORDER — LISINOPRIL 40 MG PO TABS: 40.0000 mg | ORAL_TABLET | Freq: Every day | ORAL | 3 refills | Status: DC

## 2016-05-24 MED ORDER — AMLODIPINE BESYLATE 10 MG PO TABS: 10.0000 mg | ORAL_TABLET | Freq: Every day | ORAL | 3 refills | Status: DC

## 2016-05-24 NOTE — Telephone Encounter (Signed)
Refills complete as requested

## 2016-05-24 NOTE — Telephone Encounter (Signed)
Needs refill on Lisinopril and amlodopidine sent to [facility] / tg

## 2016-05-28 ENCOUNTER — Encounter: Payer: Self-pay | Admitting: Family Medicine

## 2016-05-28 LAB — HM DIABETES EYE EXAM

## 2016-06-04 ENCOUNTER — Ambulatory Visit (INDEPENDENT_AMBULATORY_CARE_PROVIDER_SITE_OTHER): Payer: BC Managed Care – PPO | Admitting: Family Medicine

## 2016-06-04 ENCOUNTER — Other Ambulatory Visit: Payer: Self-pay | Admitting: Family Medicine

## 2016-06-04 ENCOUNTER — Encounter: Payer: Self-pay | Admitting: Family Medicine

## 2016-06-04 VITALS — BP 122/76 | Ht 66.0 in | Wt 227.0 lb

## 2016-06-04 DIAGNOSIS — I10 Essential (primary) hypertension: Secondary | ICD-10-CM

## 2016-06-04 DIAGNOSIS — E119 Type 2 diabetes mellitus without complications: Secondary | ICD-10-CM | POA: Diagnosis not present

## 2016-06-04 DIAGNOSIS — R5383 Other fatigue: Secondary | ICD-10-CM | POA: Diagnosis not present

## 2016-06-04 DIAGNOSIS — Z79899 Other long term (current) drug therapy: Secondary | ICD-10-CM

## 2016-06-04 DIAGNOSIS — Z7901 Long term (current) use of anticoagulants: Secondary | ICD-10-CM | POA: Diagnosis not present

## 2016-06-04 DIAGNOSIS — E784 Other hyperlipidemia: Secondary | ICD-10-CM | POA: Diagnosis not present

## 2016-06-04 DIAGNOSIS — E7849 Other hyperlipidemia: Secondary | ICD-10-CM

## 2016-06-04 LAB — POCT GLYCOSYLATED HEMOGLOBIN (HGB A1C): Hemoglobin A1C: 6.9

## 2016-06-04 MED ORDER — INSULIN DETEMIR 100 UNIT/ML ~~LOC~~ SOLN: 24.0000 [IU] | Freq: Every day | SUBCUTANEOUS | 5 refills | Status: DC

## 2016-06-04 MED ORDER — EMPAGLIFLOZIN 10 MG PO TABS: 10.0000 mg | ORAL_TABLET | Freq: Every day | ORAL | 5 refills | Status: DC

## 2016-06-04 MED ORDER — PRAVASTATIN SODIUM 20 MG PO TABS: ORAL_TABLET | ORAL | 5 refills | Status: DC

## 2016-06-04 MED ORDER — HYDROCODONE-ACETAMINOPHEN 5-325 MG PO TABS: 1.0000 | ORAL_TABLET | Freq: Four times a day (QID) | ORAL | 0 refills | Status: DC | PRN

## 2016-06-04 MED ORDER — GLUCOSE BLOOD VI STRP: ORAL_STRIP | 5 refills | Status: DC

## 2016-06-04 MED ORDER — METFORMIN HCL 1000 MG PO TABS: ORAL_TABLET | ORAL | 5 refills | Status: DC

## 2016-06-04 MED ORDER — GLYBURIDE 5 MG PO TABS: ORAL_TABLET | ORAL | 5 refills | Status: DC

## 2016-06-04 NOTE — Progress Notes (Addendum)
   Subjective:    Patient ID: [name], female    DOB: 12/20/1955, 61 y.o.   MRN: [id number]  Diabetes  She presents for her follow-up diabetic visit. She has type 2 diabetes mellitus. Pertinent negatives for hypoglycemia include no confusion. Pertinent negatives for diabetes include no chest pain, no fatigue, no polydipsia, no polyphagia and no weakness. She is compliant with treatment all of the time. She rarely participates in exercise. Home blood sugar record trend: mornings 100-115, evenings 260. She does not see a podiatrist.Eye exam is current (saw last week).   Agent had open heart surgery recently for a valve replacement she's been doing better but she has not been exercising. She is on Coumadin not having any bleeding issues it is managed by cardiology She does take her blood pressure medicine on a regular basis than not denies problems She does suffer with obesity and has comorbid problems She has hyperlipidemia takes medicine on a regular basis    Review of Systems  Constitutional: Negative for activity change, appetite change and fatigue.  HENT: Negative for congestion.   Respiratory: Negative for cough.   Cardiovascular: Negative for chest pain.  Gastrointestinal: Negative for abdominal pain.  Endocrine: Negative for polydipsia and polyphagia.  Neurological: Negative for weakness.  Psychiatric/Behavioral: Negative for confusion.       Objective:   Physical Exam  Constitutional: She appears well-nourished. No distress.  Cardiovascular: Normal rate, regular rhythm and normal heart sounds.   No murmur heard. Pulmonary/Chest: Effort normal and breath sounds normal. No respiratory distress.  Musculoskeletal: She exhibits no edema.  Lymphadenopathy:    She has no cervical adenopathy.  Neurological: She is alert. She exhibits normal muscle tone.  Psychiatric: Her behavior is normal.  Vitals reviewed.   25 minutes was spent with the patient. Greater than half the  time was spent in discussion and answering questions and counseling regarding the issues that the patient came in for today.  Has benign white spots on her skin that been appearing more often recently does not appear to be cancer    patient will need thyroid ultrasound in the fall time as follow-up on thyroid nodules Assessment & Plan:  Diabetes A1c good control no low sugar spells continue current measures refills given  Hyperlipidemia continue statin recheck lab work await the results previous labs reviewed patient compliant  Heart disease stable on Coumadin no bleeding issues  Colonoscopy when cardiology gives her approval to get it done  Patient has benign lesions on skin patient defers on dermatology referral she will let us know  HTN good control continue current measures  Morbid obesity patient encouraged healthier snacks more physical activity try to bring weight down  The patient does get intermittentback pin and discomfort tat she uses ydrocodone sparingl forshe knows nt to takeit if it make her drowsy o sleepyin ddition tothis she does no use itfrequently and she avoids Advil  Follow-up 6 months

## 2016-06-05 LAB — CBC WITH DIFFERENTIAL/PLATELET
BASOS: 1 %
Basophils Absolute: 0.1 10*3/uL (ref 0.0–0.2)
EOS (ABSOLUTE): 0.3 10*3/uL (ref 0.0–0.4)
EOS: 4 %
HEMATOCRIT: 39.4 % (ref 34.0–46.6)
Hemoglobin: 12.9 g/dL (ref 11.1–15.9)
Immature Grans (Abs): 0 10*3/uL (ref 0.0–0.1)
Immature Granulocytes: 0 %
LYMPHS: 35 %
Lymphocytes Absolute: 2.9 10*3/uL (ref 0.7–3.1)
MCH: 26.3 pg — AB (ref 26.6–33.0)
MCHC: 32.7 g/dL (ref 31.5–35.7)
MCV: 80 fL (ref 79–97)
Monocytes Absolute: 1 10*3/uL — ABNORMAL HIGH (ref 0.1–0.9)
Monocytes: 12 %
NEUTROS ABS: 4.1 10*3/uL (ref 1.4–7.0)
NEUTROS PCT: 48 %
Platelets: 330 10*3/uL (ref 150–379)
RBC: 4.91 x10E6/uL (ref 3.77–5.28)
RDW: 16.4 % — ABNORMAL HIGH (ref 12.3–15.4)
WBC: 8.4 10*3/uL (ref 3.4–10.8)

## 2016-06-05 LAB — BASIC METABOLIC PANEL
BUN / CREAT RATIO: 26 (ref 12–28)
BUN: 28 mg/dL — ABNORMAL HIGH (ref 8–27)
CALCIUM: 9.6 mg/dL (ref 8.7–10.3)
CO2: 26 mmol/L (ref 18–29)
CREATININE: 1.09 mg/dL — AB (ref 0.57–1.00)
Chloride: 100 mmol/L (ref 96–106)
GFR, EST AFRICAN AMERICAN: 63 mL/min/{1.73_m2} (ref >59)
GFR, EST NON AFRICAN AMERICAN: 55 mL/min/{1.73_m2} — AB (ref >59)
Glucose: 146 mg/dL — ABNORMAL HIGH (ref 65–99)
Potassium: 4.9 mmol/L (ref 3.5–5.2)
Sodium: 137 mmol/L (ref 134–144)

## 2016-06-05 LAB — LIPID PANEL
CHOLESTEROL TOTAL: 157 mg/dL (ref 100–199)
Chol/HDL Ratio: 2.7 ratio (ref 0.0–4.4)
HDL: 59 mg/dL (ref >39)
LDL CALC: 64 mg/dL (ref 0–99)
Triglycerides: 168 mg/dL — ABNORMAL HIGH (ref 0–149)
VLDL Cholesterol Cal: 34 mg/dL (ref 5–40)

## 2016-06-05 LAB — HEPATIC FUNCTION PANEL
ALBUMIN: 4.3 g/dL (ref 3.6–4.8)
ALK PHOS: 87 IU/L (ref 39–117)
ALT: 14 IU/L (ref 0–32)
AST: 15 IU/L (ref 0–40)
BILIRUBIN TOTAL: 0.4 mg/dL (ref 0.0–1.2)
Bilirubin, Direct: 0.14 mg/dL (ref 0.00–0.40)
TOTAL PROTEIN: 6.9 g/dL (ref 6.0–8.5)

## 2016-06-11 ENCOUNTER — Other Ambulatory Visit: Payer: Self-pay | Admitting: Physician Assistant

## 2016-06-12 ENCOUNTER — Telehealth: Payer: Self-pay | Admitting: Adult Health

## 2016-06-12 ENCOUNTER — Other Ambulatory Visit: Payer: Self-pay

## 2016-06-12 MED ORDER — METOPROLOL TARTRATE 50 MG PO TABS
50.0000 mg | ORAL_TABLET | Freq: Two times a day (BID) | ORAL | 3 refills | Status: DC
Start: 2016-06-12 — End: 2016-10-22

## 2016-06-12 NOTE — Telephone Encounter (Signed)
° ° ° °  1. Which medications need to be refilled? (please list name of each medication and dose if known) metoprolol (LOPRESSOR) 50 MG tablet [[id number]]   2. Which pharmacy/location (including street and city if local pharmacy) is medication to be sent to? [place]  3. Do they need a 30 day or 90 day supply?

## 2016-06-13 ENCOUNTER — Ambulatory Visit (INDEPENDENT_AMBULATORY_CARE_PROVIDER_SITE_OTHER): Payer: BC Managed Care – PPO | Admitting: *Deleted

## 2016-06-13 DIAGNOSIS — Z5181 Encounter for therapeutic drug level monitoring: Secondary | ICD-10-CM

## 2016-06-13 DIAGNOSIS — Z952 Presence of prosthetic heart valve: Secondary | ICD-10-CM | POA: Diagnosis not present

## 2016-06-13 LAB — POCT INR: INR: 2.8

## 2016-07-03 ENCOUNTER — Ambulatory Visit (INDEPENDENT_AMBULATORY_CARE_PROVIDER_SITE_OTHER): Payer: BC Managed Care – PPO | Admitting: Family Medicine

## 2016-07-03 ENCOUNTER — Encounter: Payer: Self-pay | Admitting: Family Medicine

## 2016-07-03 VITALS — BP 138/82 | Ht 66.0 in

## 2016-07-03 DIAGNOSIS — E119 Type 2 diabetes mellitus without complications: Secondary | ICD-10-CM | POA: Diagnosis not present

## 2016-07-03 NOTE — Progress Notes (Signed)
   Subjective:    Patient ID: [name], female    DOB: 12-14-1955, 61 y.o.   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  10-15 minutes was spent filling out a form regarding her driving She is aware of the symptoms of low sugar. She is compliant with her medicines. She is motivated to do well. Is not had any accidents or injuries associated with diabetes. 15 minutes was spent filling out forms related to driving safety with diabetes Patient not having any low sugar spells Compliant with medicines Diabetes under good control   Review of Systems     Objective:   Physical Exam        Assessment & Plan:  Diabetes stable control follow-up within 6 months  I have talked with the patient at length about safety of driving. 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

## 2016-07-11 ENCOUNTER — Ambulatory Visit (INDEPENDENT_AMBULATORY_CARE_PROVIDER_SITE_OTHER): Payer: BC Managed Care – PPO | Admitting: *Deleted

## 2016-07-11 DIAGNOSIS — Z5181 Encounter for therapeutic drug level monitoring: Secondary | ICD-10-CM | POA: Diagnosis not present

## 2016-07-11 DIAGNOSIS — Z952 Presence of prosthetic heart valve: Secondary | ICD-10-CM

## 2016-07-11 LAB — POCT INR: INR: 3

## 2016-08-20 ENCOUNTER — Other Ambulatory Visit: Payer: Self-pay | Admitting: Physician Assistant

## 2016-08-22 ENCOUNTER — Ambulatory Visit (INDEPENDENT_AMBULATORY_CARE_PROVIDER_SITE_OTHER): Payer: BC Managed Care – PPO | Admitting: *Deleted

## 2016-08-22 DIAGNOSIS — Z5181 Encounter for therapeutic drug level monitoring: Secondary | ICD-10-CM | POA: Diagnosis not present

## 2016-08-22 DIAGNOSIS — Z952 Presence of prosthetic heart valve: Secondary | ICD-10-CM

## 2016-08-22 LAB — POCT INR: INR: 2.5

## 2016-09-03 ENCOUNTER — Other Ambulatory Visit: Payer: Self-pay | Admitting: Adult Health

## 2016-09-14 ENCOUNTER — Other Ambulatory Visit: Payer: Self-pay | Admitting: Physician Assistant

## 2016-10-03 ENCOUNTER — Ambulatory Visit (INDEPENDENT_AMBULATORY_CARE_PROVIDER_SITE_OTHER): Payer: BC Managed Care – PPO | Admitting: *Deleted

## 2016-10-03 DIAGNOSIS — Z5181 Encounter for therapeutic drug level monitoring: Secondary | ICD-10-CM

## 2016-10-03 DIAGNOSIS — Z952 Presence of prosthetic heart valve: Secondary | ICD-10-CM

## 2016-10-03 LAB — POCT INR: INR: 3.2

## 2016-10-22 ENCOUNTER — Other Ambulatory Visit: Payer: Self-pay | Admitting: Adult Health

## 2016-10-24 ENCOUNTER — Other Ambulatory Visit: Payer: Self-pay | Admitting: Adult Health

## 2016-10-24 MED ORDER — METOPROLOL TARTRATE 50 MG PO TABS: ORAL_TABLET | ORAL | 3 refills | Status: DC

## 2016-10-24 NOTE — Telephone Encounter (Signed)
Pt called stating that [facility] has not received her Rx for metoprolol tartrate (LOPRESSOR) 50 MG tablet [[id number]]

## 2016-10-24 NOTE — Telephone Encounter (Signed)
I called in refill request for pt, #[id number] with RF: 3 metoprolol

## 2016-11-01 ENCOUNTER — Ambulatory Visit (INDEPENDENT_AMBULATORY_CARE_PROVIDER_SITE_OTHER): Payer: BC Managed Care – PPO | Admitting: *Deleted

## 2016-11-01 DIAGNOSIS — Z5181 Encounter for therapeutic drug level monitoring: Secondary | ICD-10-CM | POA: Diagnosis not present

## 2016-11-01 DIAGNOSIS — Z952 Presence of prosthetic heart valve: Secondary | ICD-10-CM

## 2016-11-01 LAB — POCT INR: INR: 2.9

## 2016-11-02 ENCOUNTER — Other Ambulatory Visit: Payer: Self-pay | Admitting: Family Medicine

## 2016-11-10 ENCOUNTER — Other Ambulatory Visit: Payer: Self-pay | Admitting: Family Medicine

## 2016-11-26 ENCOUNTER — Encounter: Payer: Self-pay | Admitting: Family Medicine

## 2016-11-26 ENCOUNTER — Ambulatory Visit (INDEPENDENT_AMBULATORY_CARE_PROVIDER_SITE_OTHER): Payer: BC Managed Care – PPO | Admitting: Family Medicine

## 2016-11-26 ENCOUNTER — Ambulatory Visit (INDEPENDENT_AMBULATORY_CARE_PROVIDER_SITE_OTHER): Payer: BC Managed Care – PPO | Admitting: *Deleted

## 2016-11-26 VITALS — BP 120/64 | Ht 66.0 in | Wt 231.4 lb

## 2016-11-26 DIAGNOSIS — E784 Other hyperlipidemia: Secondary | ICD-10-CM | POA: Diagnosis not present

## 2016-11-26 DIAGNOSIS — Z23 Encounter for immunization: Secondary | ICD-10-CM

## 2016-11-26 DIAGNOSIS — Z5181 Encounter for therapeutic drug level monitoring: Secondary | ICD-10-CM

## 2016-11-26 DIAGNOSIS — I10 Essential (primary) hypertension: Secondary | ICD-10-CM | POA: Diagnosis not present

## 2016-11-26 DIAGNOSIS — E041 Nontoxic single thyroid nodule: Secondary | ICD-10-CM | POA: Diagnosis not present

## 2016-11-26 DIAGNOSIS — Z952 Presence of prosthetic heart valve: Secondary | ICD-10-CM

## 2016-11-26 DIAGNOSIS — E7849 Other hyperlipidemia: Secondary | ICD-10-CM

## 2016-11-26 DIAGNOSIS — E119 Type 2 diabetes mellitus without complications: Secondary | ICD-10-CM

## 2016-11-26 LAB — POCT INR: INR: 4.5

## 2016-11-26 MED ORDER — METFORMIN HCL 1000 MG PO TABS: ORAL_TABLET | ORAL | 5 refills | Status: DC

## 2016-11-26 MED ORDER — EMPAGLIFLOZIN 10 MG PO TABS: 10.0000 mg | ORAL_TABLET | Freq: Every day | ORAL | 5 refills | Status: DC

## 2016-11-26 MED ORDER — LISINOPRIL 40 MG PO TABS: 40.0000 mg | ORAL_TABLET | Freq: Every day | ORAL | 9 refills | Status: DC

## 2016-11-26 MED ORDER — AMLODIPINE BESYLATE 10 MG PO TABS: 10.0000 mg | ORAL_TABLET | Freq: Every day | ORAL | 9 refills | Status: DC

## 2016-11-26 MED ORDER — METOPROLOL TARTRATE 50 MG PO TABS: ORAL_TABLET | ORAL | 3 refills | Status: DC

## 2016-11-26 MED ORDER — GLYBURIDE 5 MG PO TABS: ORAL_TABLET | ORAL | 5 refills | Status: DC

## 2016-11-26 MED ORDER — PRAVASTATIN SODIUM 20 MG PO TABS: ORAL_TABLET | ORAL | 5 refills | Status: DC

## 2016-11-26 NOTE — Progress Notes (Signed)
   Subjective:    Patient ID: [name], female    DOB: Jan 10, 1956, 61 y.o.   MRN: [id number]  HPI Patient in today to discuss ultrasound for thyroid. She denies any growth in the neck denies any pain in the neck no difficulty swallowing Patient has thyroid nodules on ultrasound last year they were stable it was recommended to repeat it again in one years time  Patient does have blood pressure issues she takes medicine on a regular basis denies any problems States no other concerns this visit.  She does take her cholesterol medicine regular basis watch his diet Her Coumadin and is followed by cardiology Her diabetes under fair control denies any significantly elevated glucoses she also denies any low sugar spells She does relate compliance with her medications.  Review of Systems  Constitutional: Negative for activity change, appetite change and fatigue.  HENT: Negative for congestion.   Respiratory: Negative for cough.   Cardiovascular: Negative for chest pain.  Gastrointestinal: Negative for abdominal pain.  Endocrine: Negative for polydipsia and polyphagia.  Skin: Negative for color change.  Neurological: Negative for weakness.  Psychiatric/Behavioral: Negative for confusion.       Objective:   Physical Exam  Constitutional: She appears well-developed and well-nourished. No distress.  HENT:  Head: Normocephalic and atraumatic.  Eyes: Right eye exhibits no discharge. Left eye exhibits no discharge.  Neck: No tracheal deviation present.  Cardiovascular: Normal rate, regular rhythm and normal heart sounds.   No murmur heard. Pulmonary/Chest: Effort normal and breath sounds normal. No respiratory distress. She has no wheezes. She has no rales.  Musculoskeletal: She exhibits no edema.  Lymphadenopathy:    She has no cervical adenopathy.  Neurological: She is alert. She exhibits normal muscle tone.  Skin: Skin is warm and dry. No erythema.  Psychiatric: Her behavior is  normal.  Vitals reviewed. Neck exam no nodules were felt  25 minutes was spent with the patient. Greater than half the time was spent in discussion and answering questions and counseling regarding the issues that the patient came in for today.       Assessment & Plan:  Thyroid nodules-no nodules felt on exam but patient clearly has thyroid nodules on previous ultrasound standard of care to repeat this again ultrasound ordered  Diabetes subpar control in the past but she's been working hard at it A1c to be checked watch diet continue medication  Hyperlipidemia previous labs reviewed no new labs ordered continue medication  Blood pressure good control with medication continue current medicine  Follow-up 6 months

## 2016-12-10 ENCOUNTER — Ambulatory Visit (INDEPENDENT_AMBULATORY_CARE_PROVIDER_SITE_OTHER): Payer: BC Managed Care – PPO | Admitting: *Deleted

## 2016-12-10 DIAGNOSIS — Z952 Presence of prosthetic heart valve: Secondary | ICD-10-CM

## 2016-12-10 DIAGNOSIS — Z5181 Encounter for therapeutic drug level monitoring: Secondary | ICD-10-CM | POA: Diagnosis not present

## 2016-12-10 LAB — POCT INR: INR: 4.6

## 2016-12-11 ENCOUNTER — Other Ambulatory Visit: Payer: Self-pay | Admitting: Family Medicine

## 2016-12-11 ENCOUNTER — Ambulatory Visit (HOSPITAL_COMMUNITY)
Admission: RE | Admit: 2016-12-11 | Discharge: 2016-12-11 | Disposition: A | Discharge status: Home / Self Care | Payer: BC Managed Care – PPO | Source: Ambulatory Visit | Attending: Family Medicine | Admitting: Family Medicine

## 2016-12-11 DIAGNOSIS — E042 Nontoxic multinodular goiter: Secondary | ICD-10-CM | POA: Diagnosis present

## 2016-12-11 DIAGNOSIS — Z1231 Encounter for screening mammogram for malignant neoplasm of breast: Secondary | ICD-10-CM

## 2016-12-12 LAB — BASIC METABOLIC PANEL
BUN/Creatinine Ratio: 16 (ref 12–28)
BUN: 19 mg/dL (ref 8–27)
CO2: 20 mmol/L (ref 20–29)
CREATININE: 1.16 mg/dL — AB (ref 0.57–1.00)
Calcium: 9.6 mg/dL (ref 8.7–10.3)
Chloride: 102 mmol/L (ref 96–106)
GFR calc Af Amer: 58 mL/min/{1.73_m2} — ABNORMAL LOW (ref >59)
GFR, EST NON AFRICAN AMERICAN: 51 mL/min/{1.73_m2} — AB (ref >59)
Glucose: 181 mg/dL — ABNORMAL HIGH (ref 65–99)
Potassium: 4.8 mmol/L (ref 3.5–5.2)
SODIUM: 138 mmol/L (ref 134–144)

## 2016-12-12 LAB — HEMOGLOBIN A1C
ESTIMATED AVERAGE GLUCOSE: 194 mg/dL
Hgb A1c MFr Bld: 8.4 % — ABNORMAL HIGH (ref 4.8–5.6)

## 2016-12-24 ENCOUNTER — Ambulatory Visit (INDEPENDENT_AMBULATORY_CARE_PROVIDER_SITE_OTHER): Payer: BC Managed Care – PPO | Admitting: *Deleted

## 2016-12-24 DIAGNOSIS — Z952 Presence of prosthetic heart valve: Secondary | ICD-10-CM | POA: Diagnosis not present

## 2016-12-24 DIAGNOSIS — Z5181 Encounter for therapeutic drug level monitoring: Secondary | ICD-10-CM | POA: Diagnosis not present

## 2016-12-24 LAB — POCT INR: INR: 3.1

## 2017-01-02 ENCOUNTER — Encounter (HOSPITAL_COMMUNITY): Payer: Self-pay

## 2017-01-02 ENCOUNTER — Ambulatory Visit (HOSPITAL_COMMUNITY)
Admission: RE | Admit: 2017-01-02 | Discharge: 2017-01-02 | Disposition: A | Discharge status: Home / Self Care | Payer: BC Managed Care – PPO | Source: Ambulatory Visit | Attending: Family Medicine | Admitting: Family Medicine

## 2017-01-02 DIAGNOSIS — Z1231 Encounter for screening mammogram for malignant neoplasm of breast: Secondary | ICD-10-CM | POA: Insufficient documentation

## 2017-01-09 ENCOUNTER — Encounter: Payer: Self-pay | Admitting: *Deleted

## 2017-01-10 MED ORDER — METFORMIN HCL 500 MG PO TABS: 500.0000 mg | ORAL_TABLET | Freq: Two times a day (BID) | ORAL | 3 refills | Status: DC

## 2017-01-10 MED ORDER — EMPAGLIFLOZIN 25 MG PO TABS: 25.0000 mg | ORAL_TABLET | Freq: Every day | ORAL | 5 refills | Status: DC

## 2017-01-10 NOTE — Addendum Note (Signed)
Addended by: Karle Barr on: 01/10/2017 02:21 PM   Modules accepted: Orders

## 2017-01-23 ENCOUNTER — Ambulatory Visit (INDEPENDENT_AMBULATORY_CARE_PROVIDER_SITE_OTHER): Payer: BC Managed Care – PPO | Admitting: *Deleted

## 2017-01-23 DIAGNOSIS — Z5181 Encounter for therapeutic drug level monitoring: Secondary | ICD-10-CM | POA: Diagnosis not present

## 2017-01-23 DIAGNOSIS — Z952 Presence of prosthetic heart valve: Secondary | ICD-10-CM | POA: Diagnosis not present

## 2017-01-23 LAB — POCT INR: INR: 2.7

## 2017-02-21 ENCOUNTER — Ambulatory Visit (INDEPENDENT_AMBULATORY_CARE_PROVIDER_SITE_OTHER): Payer: BC Managed Care – PPO | Admitting: *Deleted

## 2017-02-21 DIAGNOSIS — Z952 Presence of prosthetic heart valve: Secondary | ICD-10-CM

## 2017-02-21 DIAGNOSIS — Z5181 Encounter for therapeutic drug level monitoring: Secondary | ICD-10-CM | POA: Diagnosis not present

## 2017-02-21 LAB — POCT INR: INR: 2.8

## 2017-03-27 ENCOUNTER — Ambulatory Visit (INDEPENDENT_AMBULATORY_CARE_PROVIDER_SITE_OTHER): Payer: BC Managed Care – PPO | Admitting: *Deleted

## 2017-03-27 DIAGNOSIS — Z952 Presence of prosthetic heart valve: Secondary | ICD-10-CM | POA: Diagnosis not present

## 2017-03-27 DIAGNOSIS — Z5181 Encounter for therapeutic drug level monitoring: Secondary | ICD-10-CM | POA: Diagnosis not present

## 2017-03-27 LAB — POCT INR: INR: 2.6

## 2017-03-27 NOTE — Patient Instructions (Signed)
Continue coumadin 1 tablet daily except 1/2 tablet on Mondays, Wednesdays and Fridays Recheck in 6 weeks 

## 2017-04-25 IMAGING — CR DG CHEST 1V PORT
1 series · 1 of 1 positions shown · non-contrast
Comparison: 12/30/2015

CLINICAL DATA: Status post aortic valve replacement.

EXAM:
PORTABLE CHEST 1 VIEW

[AP]
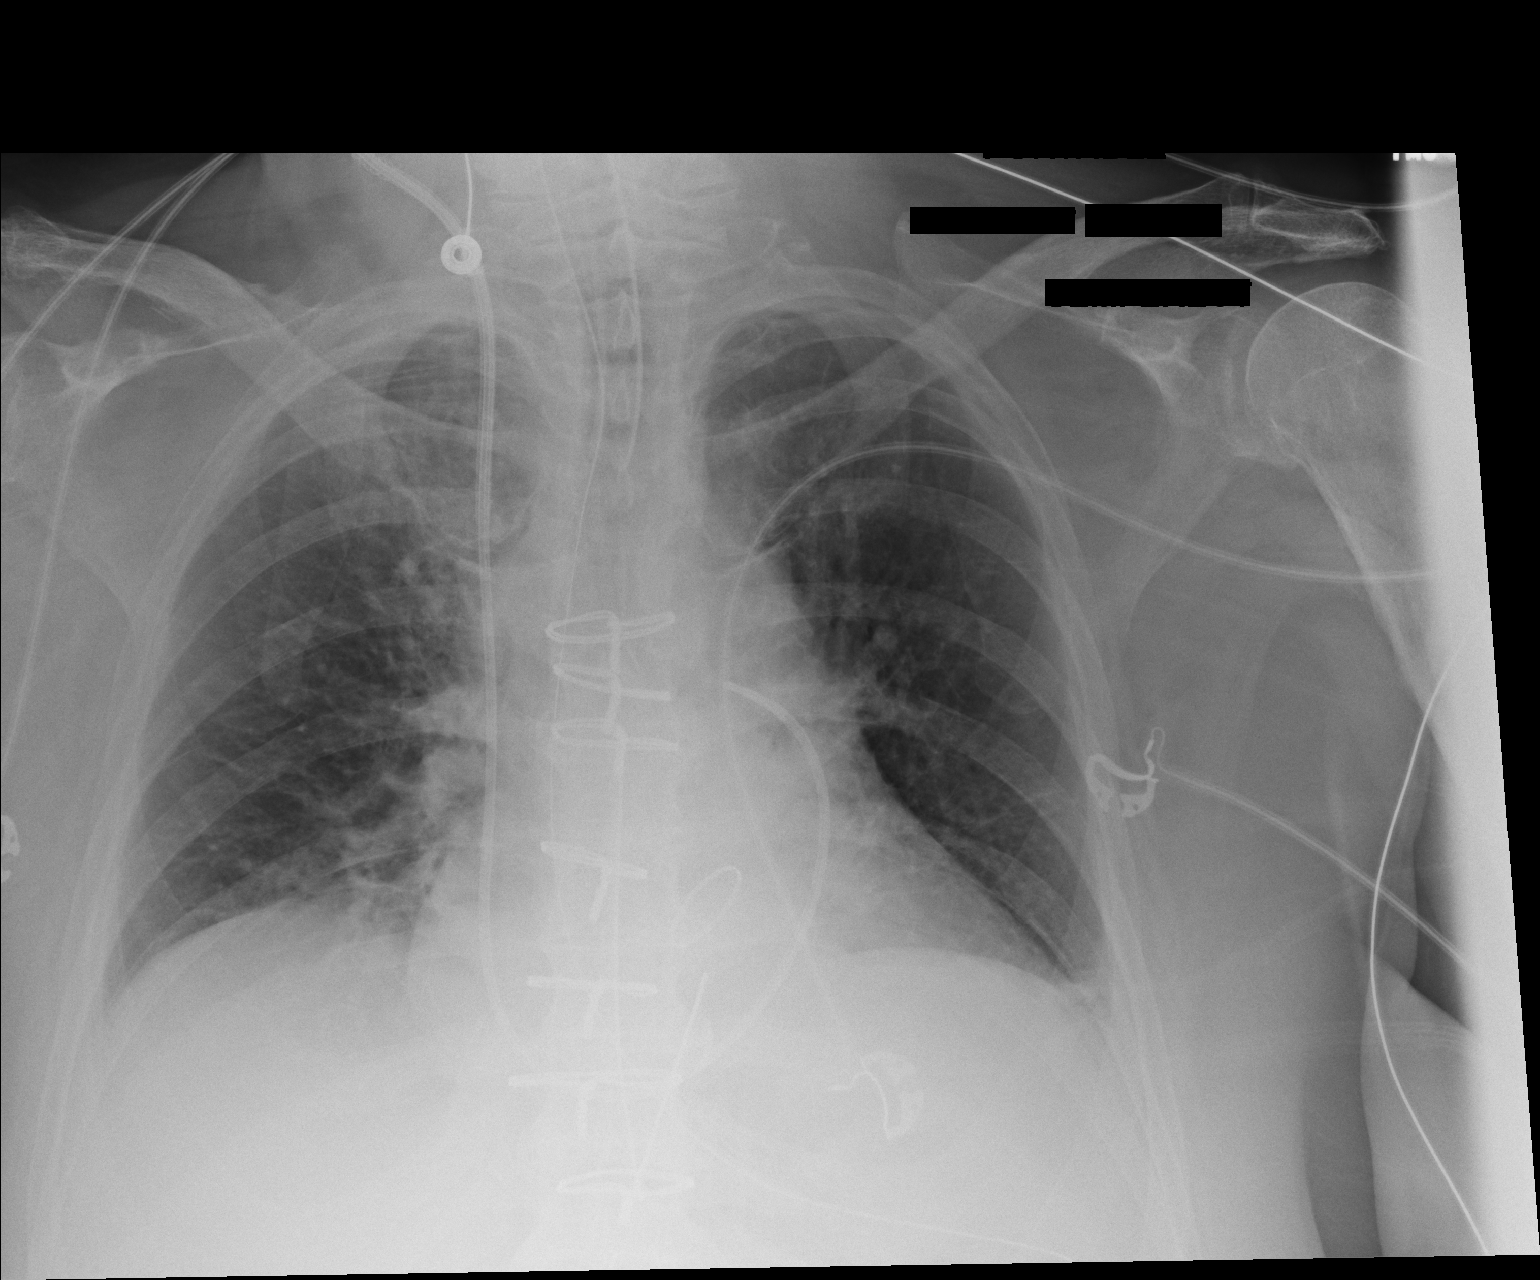

[1 of 1 positions shown; findings below may reference images not displayed]

FINDINGS: An endotracheal tube is 4.7 cm above the carina. Nasogastric tube
extends into the abdomen. Evidence for mediastinal chest tubes.
Swan-Ganz catheter in the region of the proximal right pulmonary
artery. Negative for pneumothorax. Low lung volumes. Mild blunting
at the left costophrenic angle may represent atelectasis. Changes
compatible with aortic valve surgery.
IMPRESSION: Support apparatuses are appropriately positioned.

Low lung volumes with left basilar atelectasis or small effusion. No
pneumothorax.

## 2017-04-27 IMAGING — CR DG CHEST 1V PORT
1 series · 1 of 1 positions shown · non-contrast
Comparison: 01/04/2016

CLINICAL DATA: Status post aortic valve repair with chest pain

EXAM:
PORTABLE CHEST 1 VIEW

[AP]
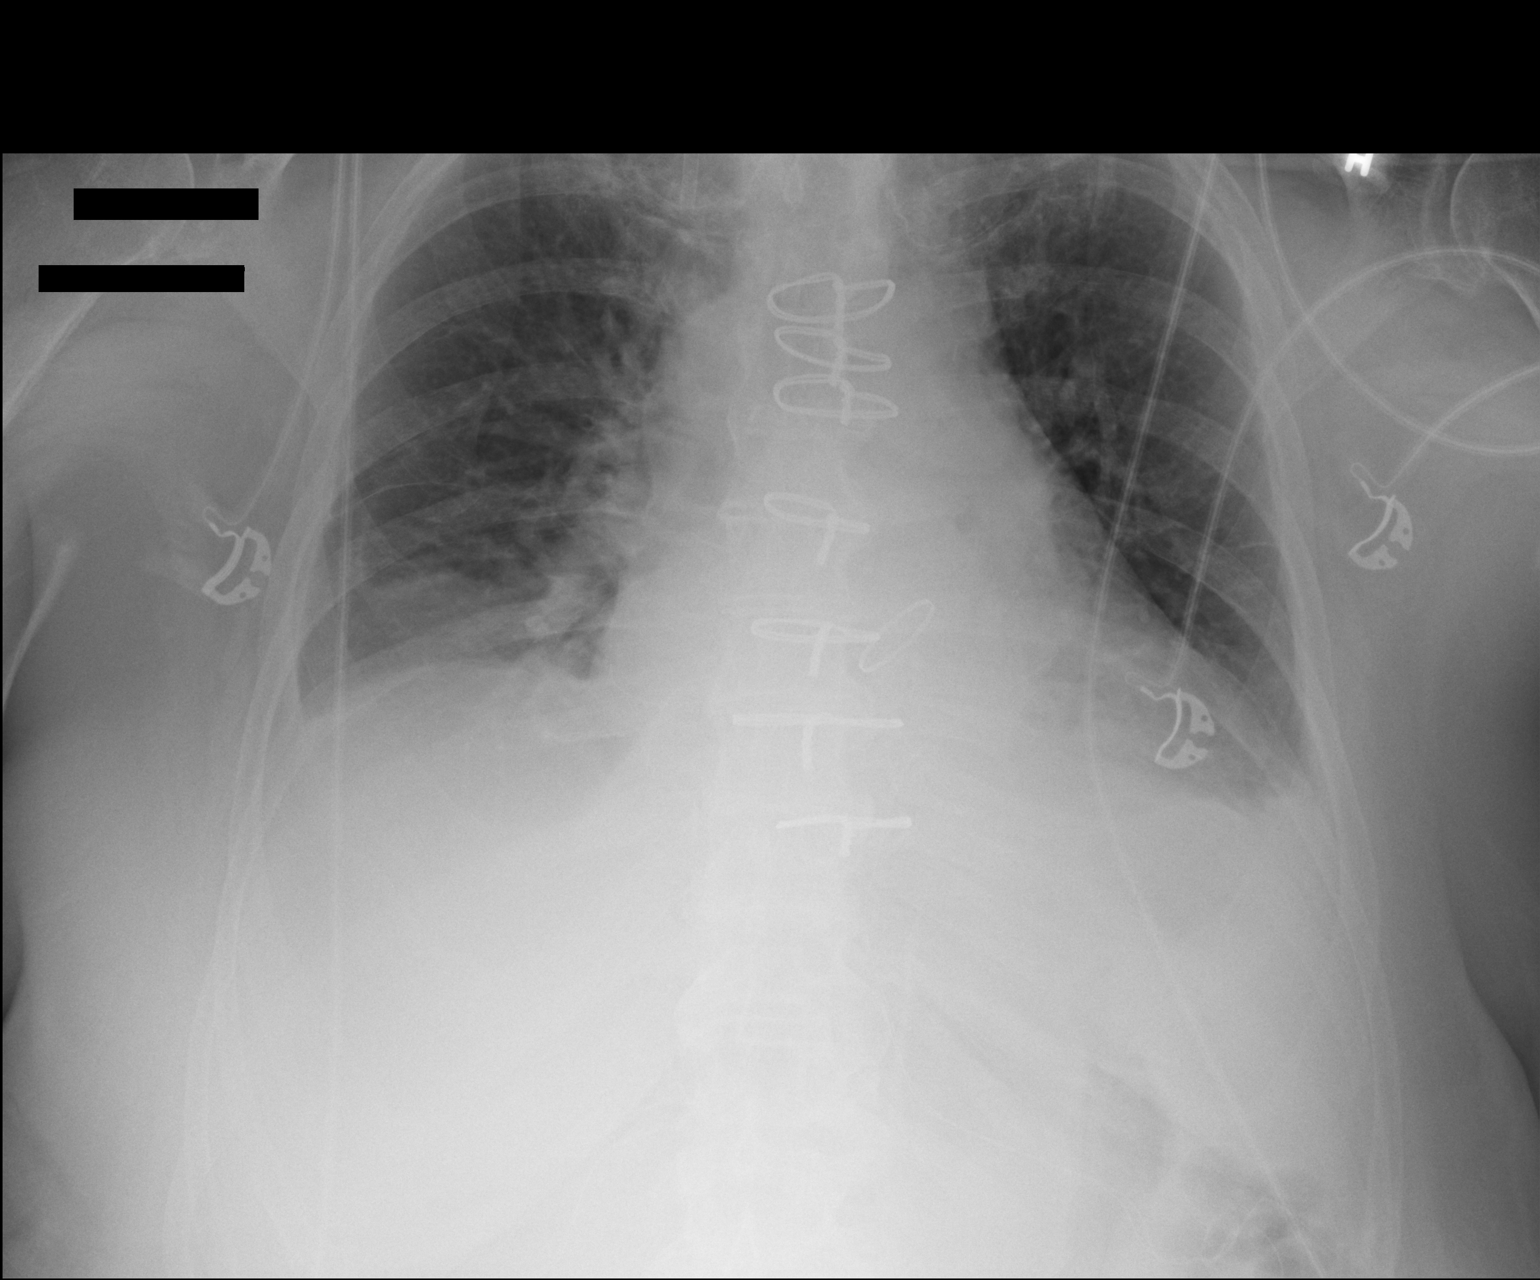

[1 of 1 positions shown; findings below may reference images not displayed]

FINDINGS: Right jugular sheath remains. The Swan-Ganz catheter has been
removed however. Mediastinal drain has also been removed as well as
apparent pericardial drain. Small bilateral pleural effusions are
seen. Some likely underlying atelectasis is present as well. No
pneumothorax is seen.
IMPRESSION: Small bilateral pleural effusions.

## 2017-05-01 ENCOUNTER — Other Ambulatory Visit: Payer: Self-pay | Admitting: Cardiology

## 2017-05-08 ENCOUNTER — Ambulatory Visit (INDEPENDENT_AMBULATORY_CARE_PROVIDER_SITE_OTHER): Payer: BC Managed Care – PPO | Admitting: *Deleted

## 2017-05-08 DIAGNOSIS — Z952 Presence of prosthetic heart valve: Secondary | ICD-10-CM | POA: Diagnosis not present

## 2017-05-08 DIAGNOSIS — Z5181 Encounter for therapeutic drug level monitoring: Secondary | ICD-10-CM | POA: Diagnosis not present

## 2017-05-08 LAB — POCT INR: INR: 2.8

## 2017-05-08 NOTE — Patient Instructions (Signed)
Continue coumadin 1 tablet daily except 1/2 tablet on Mondays, Wednesdays and Fridays Recheck in 6 weeks 

## 2017-05-30 ENCOUNTER — Other Ambulatory Visit: Payer: Self-pay | Admitting: Family Medicine

## 2017-05-30 NOTE — Telephone Encounter (Signed)
May refill each, 1 additional refill each, recommend follow-up office visit this spring

## 2017-05-31 IMAGING — CR DG CHEST 2V
2 series · 2 of 2 positions shown · non-contrast
Comparison: 01/05/2016

CLINICAL DATA: Post aortic valve replacement.

EXAM:
CHEST  2 VIEW

[w chest pa]
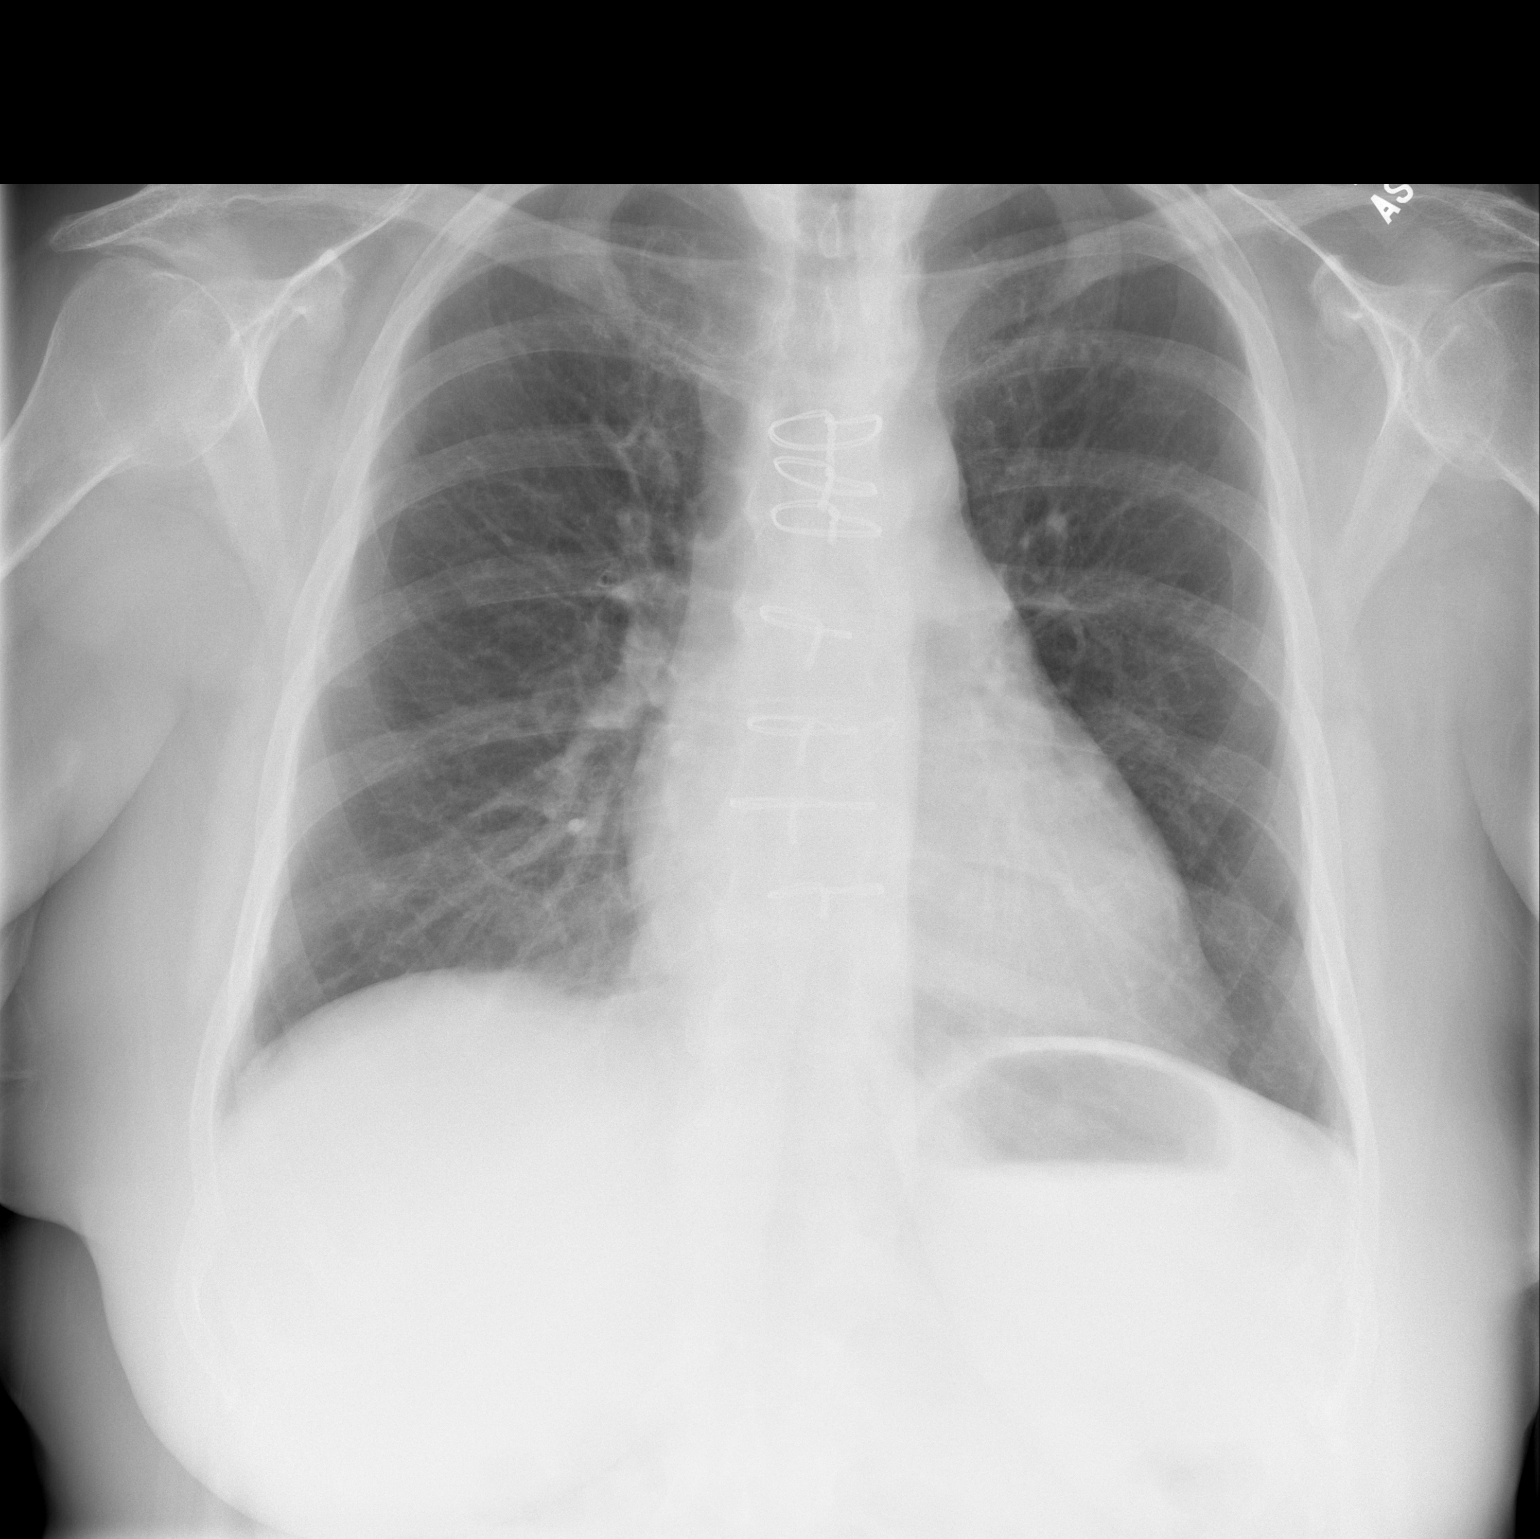

[w chest lat]
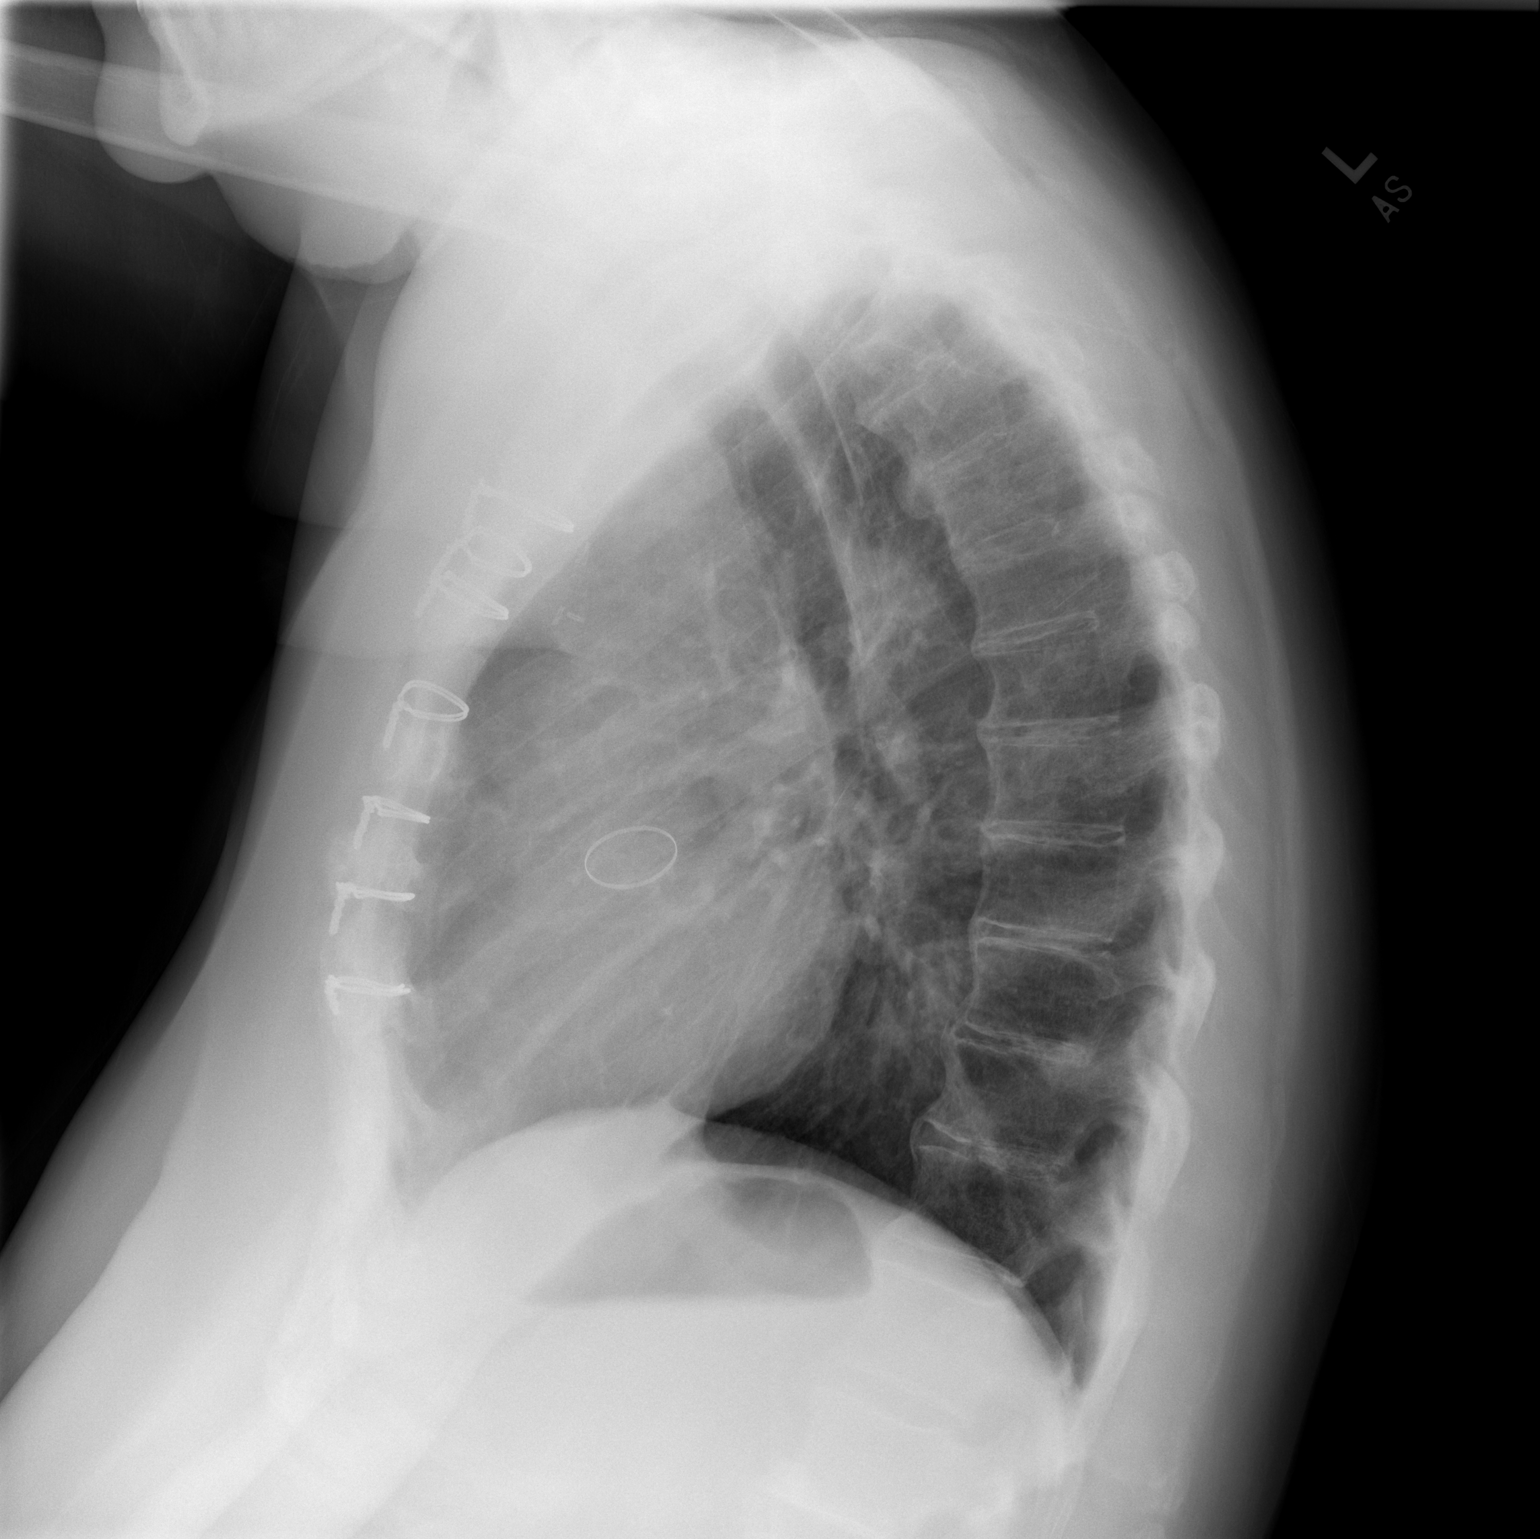

[2 of 2 positions shown; findings below may reference images not displayed]

FINDINGS: Prior aortic valve replacement. Heart is upper limits normal in
size. Lungs are clear. No effusions or pneumothorax. No acute bony
abnormality.
IMPRESSION: No active cardiopulmonary disease.

## 2017-06-19 ENCOUNTER — Ambulatory Visit (INDEPENDENT_AMBULATORY_CARE_PROVIDER_SITE_OTHER): Payer: BC Managed Care – PPO | Admitting: *Deleted

## 2017-06-19 DIAGNOSIS — Z5181 Encounter for therapeutic drug level monitoring: Secondary | ICD-10-CM

## 2017-06-19 DIAGNOSIS — Z952 Presence of prosthetic heart valve: Secondary | ICD-10-CM | POA: Diagnosis not present

## 2017-06-19 LAB — POCT INR: INR: 2.3

## 2017-06-19 NOTE — Patient Instructions (Signed)
Continue coumadin 1 tablet daily except 1/2 tablet on Mondays, Wednesdays and Fridays Recheck in 6 weeks 

## 2017-06-24 ENCOUNTER — Other Ambulatory Visit: Payer: Self-pay | Admitting: Family Medicine

## 2017-07-02 ENCOUNTER — Other Ambulatory Visit: Payer: Self-pay | Admitting: Family Medicine

## 2017-07-03 ENCOUNTER — Ambulatory Visit: Payer: BC Managed Care – PPO | Admitting: Family Medicine

## 2017-07-03 VITALS — BP 130/72 | Ht 66.0 in | Wt 235.0 lb

## 2017-07-03 DIAGNOSIS — Z114 Encounter for screening for human immunodeficiency virus [HIV]: Secondary | ICD-10-CM | POA: Diagnosis not present

## 2017-07-03 DIAGNOSIS — I10 Essential (primary) hypertension: Secondary | ICD-10-CM | POA: Diagnosis not present

## 2017-07-03 DIAGNOSIS — E119 Type 2 diabetes mellitus without complications: Secondary | ICD-10-CM

## 2017-07-03 DIAGNOSIS — Z79899 Other long term (current) drug therapy: Secondary | ICD-10-CM

## 2017-07-03 DIAGNOSIS — E7849 Other hyperlipidemia: Secondary | ICD-10-CM

## 2017-07-03 DIAGNOSIS — Z1159 Encounter for screening for other viral diseases: Secondary | ICD-10-CM | POA: Diagnosis not present

## 2017-07-03 DIAGNOSIS — M533 Sacrococcygeal disorders, not elsewhere classified: Secondary | ICD-10-CM | POA: Diagnosis not present

## 2017-07-03 DIAGNOSIS — L300 Nummular dermatitis: Secondary | ICD-10-CM | POA: Diagnosis not present

## 2017-07-03 DIAGNOSIS — H1013 Acute atopic conjunctivitis, bilateral: Secondary | ICD-10-CM | POA: Diagnosis not present

## 2017-07-03 DIAGNOSIS — R5383 Other fatigue: Secondary | ICD-10-CM | POA: Diagnosis not present

## 2017-07-03 LAB — POCT GLYCOSYLATED HEMOGLOBIN (HGB A1C): Hemoglobin A1C: 7.3

## 2017-07-03 MED ORDER — EMPAGLIFLOZIN 25 MG PO TABS: 25.0000 mg | ORAL_TABLET | Freq: Every day | ORAL | 5 refills | Status: DC

## 2017-07-03 MED ORDER — METOPROLOL TARTRATE 50 MG PO TABS: ORAL_TABLET | ORAL | 1 refills | Status: DC

## 2017-07-03 MED ORDER — GLUCOSE BLOOD VI STRP: ORAL_STRIP | 5 refills | Status: DC

## 2017-07-03 MED ORDER — INSULIN DETEMIR 100 UNIT/ML FLEXPEN: PEN_INJECTOR | SUBCUTANEOUS | 5 refills | Status: DC

## 2017-07-03 MED ORDER — LISINOPRIL 40 MG PO TABS: 40.0000 mg | ORAL_TABLET | Freq: Every day | ORAL | 5 refills | Status: DC

## 2017-07-03 MED ORDER — METFORMIN HCL 500 MG PO TABS: 500.0000 mg | ORAL_TABLET | Freq: Two times a day (BID) | ORAL | 5 refills | Status: DC

## 2017-07-03 MED ORDER — AMLODIPINE BESYLATE 10 MG PO TABS: 10.0000 mg | ORAL_TABLET | Freq: Every day | ORAL | 5 refills | Status: DC

## 2017-07-03 MED ORDER — MOMETASONE FUROATE 0.1 % EX CREA: TOPICAL_CREAM | CUTANEOUS | 1 refills | Status: DC

## 2017-07-03 MED ORDER — HYDROCODONE-ACETAMINOPHEN 5-325 MG PO TABS: 1.0000 | ORAL_TABLET | ORAL | 0 refills | Status: DC | PRN

## 2017-07-03 MED ORDER — GLYBURIDE 5 MG PO TABS: ORAL_TABLET | ORAL | 5 refills | Status: DC

## 2017-07-03 MED ORDER — OLOPATADINE HCL 0.1 % OP SOLN: 1.0000 [drp] | Freq: Two times a day (BID) | OPHTHALMIC | 12 refills | Status: DC

## 2017-07-03 MED ORDER — PRAVASTATIN SODIUM 20 MG PO TABS: ORAL_TABLET | ORAL | 5 refills | Status: DC

## 2017-07-03 NOTE — Progress Notes (Signed)
Subjective:    Patient ID: [name], female    DOB: 11/18/1955, 62 y.o.   MRN: [id number]  HPI Patient is here today to follow up on Dm.She is on Glyburide 5 mg one bid, Jardiance 25 mg once daily, Metformin 500 mg one bid. She states she does not eats as healthy as she should, she does not do a lot of exercise.She see Cardiology.   She denies any low spells.  She states her sugars have been running fairly well She is tolerating all of her medications She is affording her medicines currently  Patient for blood pressure check up. Patient relates compliance with meds. Todays BP reviewed with the patient. Patient denies issues with medication. Patient relates reasonable diet. Patient tries to minimize salt. Patient aware of BP goals.  Patient here for follow-up regarding cholesterol.  Patient does try to maintain a reasonable diet.  Patient does take the medication on a regular basis.  Denies missing a dose.  The patient denies any obvious side effects.  Prior blood work results reviewed with the patient.  The patient is aware of his cholesterol goals and the need to keep it under good control to lessen the risk of disease.  She does have chronic low back pain that is intermittent.  Sometimes bad enough to take medication.  She does use hydrocodone but uses it sparingly She denies abusing the medication  She does have significant allergy issues runny eyes itchy eyes redness around the eyes denies fever chills sweats denies sinus symptoms  Patient also has a rash on her lower legs its circumferential with scaling and itching is been going on for the past few weeks nothing is triggered and she is not tried anything on it other than lotions  She also having coccygeal pain does not radiate down the legs no rectal bleeding with it no abdominal pressure pain she is due for colonoscopy.  She gets soreness in the tailbone region when she sits  She does have a history of heart disease stable  currently   Results for orders placed or performed in visit on 07/03/17  POCT glycosylated hemoglobin (Hb A1C)  Result Value Ref Range   Hemoglobin A1C 7.3     Review of Systems  Constitutional: Negative for activity change, appetite change and fatigue.  HENT: Negative for congestion, ear discharge, postnasal drip and rhinorrhea.   Eyes: Positive for discharge, redness and itching. Negative for photophobia and pain.  Respiratory: Negative for cough.   Cardiovascular: Negative for chest pain.  Gastrointestinal: Negative for abdominal pain, constipation and diarrhea.  Endocrine: Negative for polydipsia and polyphagia.  Musculoskeletal: Positive for back pain. Negative for arthralgias, gait problem and joint swelling.  Skin: Negative for color change.  Neurological: Negative for dizziness, weakness and headaches.  Psychiatric/Behavioral: Negative for confusion.       Objective:   Physical Exam  Constitutional: She appears well-nourished. No distress.  Eyes: Right eye exhibits no discharge. Left eye exhibits no discharge.  Watery red denies  Neck: No thyromegaly present.  Cardiovascular: Normal rate, regular rhythm and normal heart sounds.  No murmur heard. Pulmonary/Chest: Effort normal and breath sounds normal. No stridor. No respiratory distress. She has no wheezes.  Musculoskeletal: She exhibits no edema.  Lymphadenopathy:    She has no cervical adenopathy.  Neurological: She is alert. She exhibits normal muscle tone.  Skin: Rash noted. She is not diaphoretic. No erythema. No pallor.  Psychiatric: She has a normal mood and affect. Her  behavior is normal.  Vitals reviewed. Subjective discomfort in the coccygeal bone Redness around both eyes watery eyes What appears to be nummular eczema on the lower legs several different spots approximately 3 to 4 inches in diameter with red excoriated and demarcated lines      Assessment & Plan:  The patient was seen today as part of a  comprehensive visit for diabetes. The importance of keeping her A1c at or below 7 was discussed.  Importance of regular physical activity was discussed.   The importance of adherence to medication as well as a controlled low starch/sugar diet was also discussed.  Standard follow-up visit recommended.  Finally failure to follow good diabetic measures including self effort and compliance with recommendations can certainly increase the risk of heart disease strokes kidney failure blindness loss of limb and early death was discussed with the patient.  HTN- Patient was seen today as part of a visit regarding hypertension. The importance of healthy diet and regular physical activity was discussed. The importance of compliance with medications discussed.  Ideal goal is to keep blood pressure low elevated levels certainly below 224/49 when possible.  The patient was counseled that keeping blood pressure under control lessen his risk of heart attack, stroke, kidney failure, and early death.  The importance of regular follow-ups was discussed with the patient.  Low-salt diet such as DASH recommended. Regular physical activity was recommended as well.  Patient was advised to keep regular follow-ups.  The patient was seen today as part of an evaluation regarding hyperlipidemia.  Recent lab work has been reviewed with the patient as well as the goals for good cholesterol care.  In addition to this medications have been discussed the importance of compliance with diet and medications discussed as well.  Finally the patient is aware that poor control of cholesterol, noncompliance can dramatically increase her risk of heart attack strokes and premature death.  The patient will keep regular office visits and the patient does agreed to periodic lab work.  Nummular eczema steroid cream as directed if not doing better over the next few weeks notify us we will get dermatology involved  Allergic conjunctivitis  allergy eyedrops as directed if fevers chills or other problems follow-up  Patient is due for colonoscopy she has a valve she is on Coumadin we will touch base with cardiology to see if she can come off of Coumadin  Coccygeal pain Tylenol as needed  Chronic low back pain intermittent hydrocodone caution drowsiness home use only  Extensive lab work ordered await results previous labs reviewed with patient

## 2017-07-04 LAB — CBC WITH DIFFERENTIAL/PLATELET
Basophils Absolute: 0.1 10*3/uL (ref 0.0–0.2)
Basos: 1 %
EOS (ABSOLUTE): 0.4 10*3/uL (ref 0.0–0.4)
EOS: 4 %
HEMATOCRIT: 41.3 % (ref 34.0–46.6)
Hemoglobin: 13.6 g/dL (ref 11.1–15.9)
Immature Grans (Abs): 0 10*3/uL (ref 0.0–0.1)
Immature Granulocytes: 0 %
LYMPHS ABS: 2.6 10*3/uL (ref 0.7–3.1)
Lymphs: 25 %
MCH: 28.2 pg (ref 26.6–33.0)
MCHC: 32.9 g/dL (ref 31.5–35.7)
MCV: 86 fL (ref 79–97)
MONOS ABS: 1 10*3/uL — AB (ref 0.1–0.9)
Monocytes: 10 %
Neutrophils Absolute: 6.3 10*3/uL (ref 1.4–7.0)
Neutrophils: 60 %
Platelets: 319 10*3/uL (ref 150–379)
RBC: 4.83 x10E6/uL (ref 3.77–5.28)
RDW: 13.5 % (ref 12.3–15.4)
WBC: 10.4 10*3/uL (ref 3.4–10.8)

## 2017-07-04 LAB — MICROALBUMIN / CREATININE URINE RATIO
Creatinine, Urine: 56.5 mg/dL
Microalb/Creat Ratio: 6.4 mg/g creat (ref 0.0–30.0)
Microalbumin, Urine: 3.6 ug/mL

## 2017-07-04 LAB — HEPATIC FUNCTION PANEL
ALK PHOS: 94 IU/L (ref 39–117)
ALT: 13 IU/L (ref 0–32)
AST: 18 IU/L (ref 0–40)
Albumin: 4.6 g/dL (ref 3.6–4.8)
Bilirubin Total: 0.5 mg/dL (ref 0.0–1.2)
Bilirubin, Direct: 0.14 mg/dL (ref 0.00–0.40)
TOTAL PROTEIN: 7.2 g/dL (ref 6.0–8.5)

## 2017-07-04 LAB — LIPID PANEL
CHOLESTEROL TOTAL: 161 mg/dL (ref 100–199)
Chol/HDL Ratio: 2.7 ratio (ref 0.0–4.4)
HDL: 59 mg/dL (ref >39)
LDL CALC: 65 mg/dL (ref 0–99)
TRIGLYCERIDES: 183 mg/dL — AB (ref 0–149)
VLDL Cholesterol Cal: 37 mg/dL (ref 5–40)

## 2017-07-04 LAB — HIV ANTIBODY (ROUTINE TESTING W REFLEX): HIV SCREEN 4TH GENERATION: NONREACTIVE

## 2017-07-04 LAB — HEPATITIS C ANTIBODY

## 2017-07-31 ENCOUNTER — Ambulatory Visit (INDEPENDENT_AMBULATORY_CARE_PROVIDER_SITE_OTHER): Payer: BC Managed Care – PPO | Admitting: *Deleted

## 2017-07-31 DIAGNOSIS — Z952 Presence of prosthetic heart valve: Secondary | ICD-10-CM

## 2017-07-31 DIAGNOSIS — Z5181 Encounter for therapeutic drug level monitoring: Secondary | ICD-10-CM

## 2017-07-31 LAB — POCT INR: INR: 2.4 (ref 2.0–3.0)

## 2017-07-31 NOTE — Patient Instructions (Signed)
Continue coumadin 1 tablet daily except 1/2 tablet on Mondays, Wednesdays and Fridays Recheck in 6 weeks 

## 2017-08-25 NOTE — Progress Notes (Signed)
Nurses-the patient is due for a colonoscopy. On her last visit we discussed this with her Coumadin Cardiology says that she can go ahead with colonoscopy but gastroenterology would have to see her for the colonoscopy plus also discussed with her the Coumadin Typically they recommend keeping Coumadin level within a reasonable level if just doing a plain colonoscopy A lower Coumadin level if doing up polyp removal All of that can be handled in the pre-colonoscopy visit with gastroenterology I do recommend that she consider getting referred for the colonoscopy Find out where she wants to do this did go ahead with the referral

## 2017-08-27 ENCOUNTER — Other Ambulatory Visit: Payer: Self-pay | Admitting: *Deleted

## 2017-08-27 DIAGNOSIS — Z1211 Encounter for screening for malignant neoplasm of colon: Secondary | ICD-10-CM

## 2017-08-27 NOTE — Progress Notes (Signed)
Left message to return call 

## 2017-08-27 NOTE — Progress Notes (Signed)
Discussed with pt. Pt does want to go ahead with colonoscopy. Referral put in

## 2017-09-02 ENCOUNTER — Other Ambulatory Visit: Payer: Self-pay | Admitting: Family Medicine

## 2017-09-04 ENCOUNTER — Encounter (INDEPENDENT_AMBULATORY_CARE_PROVIDER_SITE_OTHER): Payer: Self-pay

## 2017-09-04 ENCOUNTER — Encounter: Payer: Self-pay | Admitting: Family Medicine

## 2017-09-10 ENCOUNTER — Encounter: Payer: Self-pay | Admitting: Gastroenterology

## 2017-09-10 ENCOUNTER — Encounter: Payer: Self-pay | Admitting: Adult Health

## 2017-09-10 ENCOUNTER — Other Ambulatory Visit (HOSPITAL_COMMUNITY)
Admission: RE | Admit: 2017-09-10 | Discharge: 2017-09-10 | Disposition: A | Discharge status: Home / Self Care | Payer: BC Managed Care – PPO | Source: Ambulatory Visit | Attending: Adult Health | Admitting: Adult Health

## 2017-09-10 ENCOUNTER — Ambulatory Visit: Payer: BC Managed Care – PPO | Admitting: Adult Health

## 2017-09-10 VITALS — BP 107/66 | HR 60 | Ht 67.0 in | Wt 234.5 lb

## 2017-09-10 DIAGNOSIS — Z01411 Encounter for gynecological examination (general) (routine) with abnormal findings: Secondary | ICD-10-CM

## 2017-09-10 DIAGNOSIS — Z1212 Encounter for screening for malignant neoplasm of rectum: Secondary | ICD-10-CM | POA: Diagnosis not present

## 2017-09-10 DIAGNOSIS — N952 Postmenopausal atrophic vaginitis: Secondary | ICD-10-CM | POA: Diagnosis not present

## 2017-09-10 DIAGNOSIS — Z1151 Encounter for screening for human papillomavirus (HPV): Secondary | ICD-10-CM | POA: Diagnosis not present

## 2017-09-10 DIAGNOSIS — Z1211 Encounter for screening for malignant neoplasm of colon: Secondary | ICD-10-CM

## 2017-09-10 DIAGNOSIS — N898 Other specified noninflammatory disorders of vagina: Secondary | ICD-10-CM | POA: Diagnosis not present

## 2017-09-10 DIAGNOSIS — Z01419 Encounter for gynecological examination (general) (routine) without abnormal findings: Secondary | ICD-10-CM | POA: Diagnosis present

## 2017-09-10 LAB — HEMOCCULT GUIAC POC 1CARD (OFFICE): Fecal Occult Blood, POC: NEGATIVE

## 2017-09-10 MED ORDER — TERCONAZOLE 0.4 % VA CREA
1.0000 | TOPICAL_CREAM | Freq: Every day | VAGINAL | 2 refills | Status: DC
Start: 2017-09-10 — End: 2020-04-11

## 2017-09-10 NOTE — Progress Notes (Signed)
Patient ID: [name], female   DOB: April 13, 1955, 62 y.o.   MRN: [id number] History of Present Illness: [name] is a 62 year old white female in for well woman gyn exam and pap. PCP is [name].    Current Medications, Allergies, Past Medical History, Past Surgical History, Family History and Social History were reviewed in [employer] record.     Review of Systems: Patient denies any headaches, hearing loss, fatigue, blurred vision, shortness of breath, chest pain, abdominal pain, problems with bowel movements, urination, or intercourse(not active). No joint pain or mood swings. +itching in vagina area, esp near clitoris and uses monistat, it helps some.   Physical Exam:BP 107/66 (BP Location: Left Arm, Patient Position: Sitting, Cuff Size: Large)   Pulse 60   Ht 5\' 7"  (1.702 m)   Wt 234 lb 8 oz (106.4 kg)   BMI 36.73 kg/m  General:  Well developed, well nourished, no acute distress Skin:  Warm and dry Neck:  Midline trachea,  Thyroid enlarged, good ROM, no lymphadenopathy, no carotid bruits heard Lungs; Clear to auscultation bilaterally Breast:  No dominant palpable mass, retraction, or nipple discharge Cardiovascular: Regular rate and rhythm,can hear click from valve  Abdomen:  Soft, non tender, no hepatosplenomegaly Pelvic:  External genitalia is normal in appearance, no lesions.  The vagina is normal in appearance for age with loss of moisture and rugae, and is pale . Urethra has no lesions or masses. The cervix is smooth, pap with HPV performed.  Uterus is felt to be normal size, shape, and contour.  No adnexal masses or tenderness noted.Bladder is non tender, no masses felt. Rectal: Good sphincter tone, no polyps, or hemorrhoids felt.  Hemoccult negative. Extremities/musculoskeletal:  No swelling or varicosities noted, no clubbing or cyanosis Psych:  No mood changes, alert and cooperative,seems happy PHQ 2 score 0. Will try terazol 7 as needed, may  can try vaginal estrogen if persists.  Impression: 1. Encounter for gynecological examination with Papanicolaou smear of cervix   2. Screening for colorectal cancer   3. Itching in the vaginal area   4. Vaginal atrophy       Plan: Meds ordered this encounter  Medications  . terconazole (TERAZOL 7) 0.4 % vaginal cream    Sig: Place 1 applicator vaginally at bedtime.    Dispense:  45 g    Refill:  2    Order Specific Question:   Supervising Provider    Answer:   [name] [[id number]]  Physical in 1 year, pap in 3 if normal Get mammogram yearly Colonoscopy advised Labs with PCP and cardiology

## 2017-09-11 ENCOUNTER — Ambulatory Visit (INDEPENDENT_AMBULATORY_CARE_PROVIDER_SITE_OTHER): Payer: BC Managed Care – PPO | Admitting: *Deleted

## 2017-09-11 DIAGNOSIS — Z5181 Encounter for therapeutic drug level monitoring: Secondary | ICD-10-CM

## 2017-09-11 DIAGNOSIS — Z952 Presence of prosthetic heart valve: Secondary | ICD-10-CM

## 2017-09-11 LAB — POCT INR: INR: 2.6 (ref 2.0–3.0)

## 2017-09-11 NOTE — Patient Instructions (Addendum)
Continue coumadin 1 tablet daily except 1/2 tablet on Mondays, Wednesdays and Fridays Recheck in 6 weeks Pending colonoscopy with Dr Laural Golden.  Not scheduled yet.

## 2017-09-12 LAB — CYTOLOGY - PAP
Diagnosis: NEGATIVE
HPV: NOT DETECTED

## 2017-09-30 ENCOUNTER — Encounter: Payer: Self-pay | Admitting: Physician Assistant

## 2017-09-30 ENCOUNTER — Ambulatory Visit (INDEPENDENT_AMBULATORY_CARE_PROVIDER_SITE_OTHER): Payer: BC Managed Care – PPO | Admitting: Physician Assistant

## 2017-09-30 VITALS — BP 122/60 | HR 60 | Ht 67.0 in | Wt 237.2 lb

## 2017-09-30 DIAGNOSIS — Z952 Presence of prosthetic heart valve: Secondary | ICD-10-CM | POA: Diagnosis not present

## 2017-09-30 DIAGNOSIS — E119 Type 2 diabetes mellitus without complications: Secondary | ICD-10-CM | POA: Diagnosis not present

## 2017-09-30 DIAGNOSIS — I719 Aortic aneurysm of unspecified site, without rupture: Secondary | ICD-10-CM

## 2017-09-30 DIAGNOSIS — N289 Disorder of kidney and ureter, unspecified: Secondary | ICD-10-CM

## 2017-09-30 DIAGNOSIS — I10 Essential (primary) hypertension: Secondary | ICD-10-CM | POA: Diagnosis not present

## 2017-09-30 DIAGNOSIS — E782 Mixed hyperlipidemia: Secondary | ICD-10-CM

## 2017-09-30 DIAGNOSIS — Z01818 Encounter for other preprocedural examination: Secondary | ICD-10-CM | POA: Insufficient documentation

## 2017-09-30 NOTE — Patient Instructions (Signed)
Medication Instructions:  Your physician recommends that you continue on your current medications as directed. Please refer to the Current Medication list given to you today.   Labwork: Your physician recommends that you return for lab work in: Just before CT Chest     Testing/Procedures: Non-Cardiac CT Angiography (CTA), is a special type of CT scan that uses a computer to produce multi-dimensional views of major blood vessels throughout the body. In CT angiography, a contrast material is injected through an IV to help visualize the blood vessels  Follow-Up: Your physician wants you to follow-up in: 1 Year with Dr. Domenic Polite. You will receive a reminder letter in the mail two months in advance. If you don't receive a letter, please call our office to schedule the follow-up appointment.   Any Other Special Instructions Will Be Listed Below (If Applicable).     If you need a refill on your cardiac medications before your next appointment, please call your pharmacy. Thank you for choosing [facility]!

## 2017-09-30 NOTE — Progress Notes (Signed)
Cardiology Office Note    Date:  09/30/2017   ID:  [name], DOB March 26, 1955, MRN [id number]  PCP:  Kathyrn Drown, MD  Cardiologist: Rozann Lesches, MD  Chief Complaint  Patient presents with  . Pre-op Exam    History of Present Illness:  [name] is a 62 y.o. female with history of St. Jude AVR 2017 on Coumadin  Also has hypertension, DM, HLD, renal insufficiency.  Patient last seen in our office 03/2016.  Patient did have an asending aortic aneurysm 4.1 x 4.0 in 2017.  Patient was last seen in our office 03/29/2016 at which time she was doing well.  Is followed regularly in our Coumadin clinic.  Patient comes in today for clearance for colonoscopy.Works full time as Environmental consultant at [employer].Doesn't exercise at all. Denies chest pain, dyspnea, dyspnea on exertion, dizziness or presyncope.  Past Medical History:  Diagnosis Date  . Aortic stenosis    Mild 5/12  . Aortic stenosis   . Dyspnea    with exertion  . Essential hypertension, benign    takes Lisinopril daily  . Insomnia    takes Trazodone nightly as needed  . Mixed hyperlipidemia    takes Pravastatin daily  . Nocturia   . Numbness    both feet  . Restless leg syndrome   . Ruptured disk   . Type 2 diabetes mellitus (HCC)    takes Glyburide,Metformin,Jardiance,and Levemir daily.Average fasting blood sugar 120    Past Surgical History:  Procedure Laterality Date  . AORTIC VALVE REPLACEMENT N/A 01/03/2016   Procedure: AORTIC VALVE REPLACEMENT (AVR);  Surgeon: Gaye Pollack, MD;  Location: [facility];  Service: Open Heart Surgery;  Laterality: N/A;  . CARDIAC CATHETERIZATION N/A 12/16/2015   Procedure: Right/Left Heart Cath and Coronary Angiography;  Surgeon: Belva Crome, MD;  Location: [facility];  Service: Cardiovascular;  Laterality: N/A;  . COLONOSCOPY    . TEE WITHOUT CARDIOVERSION N/A 01/03/2016   Procedure: TRANSESOPHAGEAL ECHOCARDIOGRAM (TEE);  Surgeon: Gaye Pollack, MD;   Location: [facility];  Service: Open Heart Surgery;  Laterality: N/A;  . TOE SURGERY     left foot middle     Current Medications: Current Meds  Medication Sig  . ACCU-CHEK FASTCLIX LANCETS MISC USE DAILY AS DIRECTED.  Marland Kitchen acetaminophen (TYLENOL) 325 MG tablet Take 2 tablets (650 mg total) by mouth every 6 (six) hours as needed for mild pain.  Marland Kitchen amLODipine (NORVASC) 10 MG tablet Take 1 tablet (10 mg total) by mouth daily.  Marland Kitchen aspirin 81 MG tablet Take 81 mg by mouth daily.  Marland Kitchen glucose blood (ACCU-CHEK AVIVA PLUS) test strip TEST DAILY AS DIRECTED.  Marland Kitchen glyBURIDE (DIABETA) 5 MG tablet Take one po Q am and one po with supper  . HYDROcodone-acetaminophen (NORCO/VICODIN) 5-325 MG tablet Take 1 tablet by mouth every 4 (four) hours as needed.  . Insulin Detemir (LEVEMIR FLEXTOUCH) 100 UNIT/ML Pen INJECT 24 UNITS INTO THE SKIN AT BEDTIME.  . insulin detemir (LEVEMIR) 100 UNIT/ML injection Inject 0.24 mLs (24 Units total) into the skin at bedtime.  Marland Kitchen JARDIANCE 25 MG TABS tablet TAKE 1 TABLET BY MOUTH DAILY.  Marland Kitchen lisinopril (PRINIVIL,ZESTRIL) 40 MG tablet Take 1 tablet (40 mg total) by mouth daily.  . metFORMIN (GLUCOPHAGE) 500 MG tablet Take 1 tablet (500 mg total) by mouth 2 (two) times daily with a meal.  . metoprolol tartrate (LOPRESSOR) 50 MG tablet TAKE (1) TABLET BY MOUTH TWICE DAILY.  Marland Kitchen  mometasone (ELOCON) 0.1 % cream Apply to affected area bid prn  . pravastatin (PRAVACHOL) 20 MG tablet Take one po QHS  . SURE COMFORT PEN NEEDLES 31G X 5 MM MISC USE AS DIRECTED ONCE DAILY.  Marland Kitchen terconazole (TERAZOL 7) 0.4 % vaginal cream Place 1 applicator vaginally at bedtime.  Marland Kitchen warfarin (COUMADIN) 5 MG tablet Take 1 tablet daily except 1/2 tablet on Mondays, Wednesdays and Fridays     Allergies:   No known allergies   Social History   Socioeconomic History  . Marital status: Married    Spouse name: Not on file  . Number of children: Not on file  . Years of education: Not on file  . Highest education level:  Not on file  Occupational History  . Occupation: Network engineer    Comment: Psychiatrist: [employer]  Social Needs  . Financial resource strain: Not on file  . Food insecurity:    Worry: Not on file    Inability: Not on file  . Transportation needs:    Medical: Not on file    Non-medical: Not on file  Tobacco Use  . Smoking status: Never Smoker  . Smokeless tobacco: Never Used  Substance and Sexual Activity  . Alcohol use: No  . Drug use: No  . Sexual activity: Not Currently    Birth control/protection: Post-menopausal  Lifestyle  . Physical activity:    Days per week: Not on file    Minutes per session: Not on file  . Stress: Not on file  Relationships  . Social connections:    Talks on phone: Not on file    Gets together: Not on file    Attends religious service: Not on file    Active member of club or organization: Not on file    Attends meetings of clubs or organizations: Not on file    Relationship status: Not on file  Other Topics Concern  . Not on file  Social History Narrative  . Not on file     Family History:  The patient's family history includes Coronary artery disease in her father; Diabetes in her brother and mother; Diabetes type II in her brother and mother; Hyperlipidemia in her sister; Hypertension in her father, mother, and sister.   ROS:   Please see the history of present illness.    Review of Systems  HENT: Negative.   Eyes: Negative.   Cardiovascular: Negative.   Respiratory: Negative.   Hematologic/Lymphatic: Negative.   Musculoskeletal: Negative.  Negative for joint pain.  Gastrointestinal: Negative.   Genitourinary: Negative.   Neurological: Negative.    All other systems reviewed and are negative.   PHYSICAL EXAM:   VS:  BP 122/60   Pulse 60   Ht 5\' 7"  (1.702 m)   Wt 237 lb 3.2 oz (107.6 kg)   SpO2 98%   BMI 37.15 kg/m   Physical Exam  GEN: Obese, in no acute distress  Neck: no JVD, carotid bruits, or  masses Cardiac:RRR; crisp valvular clicks, no murmur Respiratory:  clear to auscultation bilaterally, normal work of breathing GI: soft, nontender, nondistended, + BS Ext: mild ankle edema with varicosities. Good distal pulses bilaterally Neuro:  Alert and Oriented x 3 Psych: euthymic mood, full affect  Wt Readings from Last 3 Encounters:  09/30/17 237 lb 3.2 oz (107.6 kg)  09/10/17 234 lb 8 oz (106.4 kg)  07/03/17 235 lb (106.6 kg)      Studies/Labs Reviewed:  EKG:  EKG is  ordered today.  The ekg ordered today demonstrates sinus bradycardia at 58 bpm otherwise normal  Recent Labs: 12/11/2016: BUN 19; Creatinine, Ser 1.16; Potassium 4.8; Sodium 138 07/03/2017: ALT 13; Hemoglobin 13.6; Platelets 319   Lipid Panel    Component Value Date/Time   CHOL 161 07/03/2017 0945   TRIG 183 (H) 07/03/2017 0945   HDL 59 07/03/2017 0945   CHOLHDL 2.7 07/03/2017 0945   CHOLHDL 2.5 07/02/2014 0714   VLDL 35 07/02/2014 0714   LDLCALC 65 07/03/2017 0945    Additional studies/ records that were reviewed today include:  2D echo 11/09/2015   Left ventricle: The cavity size was normal. Wall thickness was   increased in a pattern of moderate LVH. Systolic function was   vigorous. The estimated ejection fraction was in the range of 65%   to 70%. Wall motion was normal; there were no regional wall   motion abnormalities. Doppler parameters are consistent with   abnormal left ventricular relaxation (grade 1 diastolic   dysfunction). - Aortic valve: Bicuspid; moderately calcified leaflets. Cusp   separation was reduced. There was severe stenosis. Mean gradient   (S): 52 mm Hg. Peak gradient (S): 88 mm Hg. VTI ratio of LVOT to   aortic valve: 0.26. Valve area (VTI): 0.9 cm^2. - Left atrium: The atrium was at the upper limits of normal in   size. - Right atrium: Central venous pressure (est): 3 mm Hg. - Atrial septum: No defect or patent foramen ovale was identified. - Tricuspid valve: There was  trivial regurgitation. - Pulmonary arteries: PA peak pressure: 27 mm Hg (S). - Pericardium, extracardiac: A prominent pericardial fat pad was   present.   Procedure: 01/03/2016   1. Median Sternotomy 2. Extracorporeal circulation 3.   Aortic valve replacement using a 21 mm  St. Jude Regent mechanical valve.     ASSESSMENT:    1. Preoperative clearance   2. S/P AVR   3. Essential hypertension   4. Type 2 diabetes mellitus not at goal [facility])   5. Renal insufficiency   6. Mixed hyperlipidemia   7. Aortic aneurysm without rupture, unspecified portion of aorta (HCC)      PLAN:  In order of problems listed above:  Preoperative clearance for colonoscopy.  Patient is status post Saint Jude atrial valve replacement in 2017 and is without cardiac complaints.  According to the revised cardiac risk index patient does not need any prior testing before undergoing colonoscopy.  She needs to contact our Coumadin clinic once she knows the date of the colonoscopy to discuss how long Coumadin can be held. According to the Revised Cardiac Risk Index (RCRI), her Perioperative Risk of Major Cardiac Event is (%): 0.9  Her Functional Capacity in METs is: 6.05 according to the Duke Activity Status Index (DASI).   Status post AVR 2017 on chronic Coumadin doing well.  Follow-up with Dr. Domenic Polite in 1 year.  Essential hypertension blood pressure well controlled  Diabetes mellitus type 2 managed by PCP  Renal insufficiency last creatinine 1.16 on 12/11/2016 recheck renal function today.  Mixed hyperlipidemia LDL below goal at 65 on lipid panel 07/03/2017 triglycerides elevated 183  Asending aortic aneurysm 4.1 x 4.0 in 2017.  We will repeat CT scan.  Medication Adjustments/Labs and Tests Ordered: Current medicines are reviewed at length with the patient today.  Concerns regarding medicines are outlined above.  Medication changes, Labs and Tests ordered today are listed in the Patient Instructions  below.  Patient Instructions  Medication Instructions:  Your physician recommends that you continue on your current medications as directed. Please refer to the Current Medication list given to you today.   Labwork: Your physician recommends that you return for lab work in: Just before CT Chest     Testing/Procedures: Non-Cardiac CT Angiography (CTA), is a special type of CT scan that uses a computer to produce multi-dimensional views of major blood vessels throughout the body. In CT angiography, a contrast material is injected through an IV to help visualize the blood vessels  Follow-Up: Your physician wants you to follow-up in: 1 Year with Dr. Domenic Polite. You will receive a reminder letter in the mail two months in advance. If you don't receive a letter, please call our office to schedule the follow-up appointment.   Any Other Special Instructions Will Be Listed Below (If Applicable).     If you need a refill on your cardiac medications before your next appointment, please call your pharmacy. Thank you for choosing [facility]!       Sumner Boast, PA-C  09/30/2017 2:34 PM    [facility] [address], [place], [place]  [address] Phone: [phone]; Fax: [phone]

## 2017-10-11 LAB — BASIC METABOLIC PANEL
BUN/Creatinine Ratio: 21 (calc) (ref 6–22)
BUN: 24 mg/dL (ref 7–25)
CALCIUM: 9.4 mg/dL (ref 8.6–10.4)
CO2: 27 mmol/L (ref 20–32)
Chloride: 102 mmol/L (ref 98–110)
Creat: 1.14 mg/dL — ABNORMAL HIGH (ref 0.50–0.99)
Glucose, Bld: 215 mg/dL — ABNORMAL HIGH (ref 65–139)
POTASSIUM: 4.7 mmol/L (ref 3.5–5.3)
SODIUM: 137 mmol/L (ref 135–146)

## 2017-10-15 ENCOUNTER — Ambulatory Visit (HOSPITAL_COMMUNITY)
Admission: RE | Admit: 2017-10-15 | Discharge: 2017-10-15 | Disposition: A | Discharge status: Home / Self Care | Payer: BC Managed Care – PPO | Source: Ambulatory Visit | Attending: Physician Assistant | Admitting: Physician Assistant

## 2017-10-15 DIAGNOSIS — K8020 Calculus of gallbladder without cholecystitis without obstruction: Secondary | ICD-10-CM | POA: Diagnosis not present

## 2017-10-15 DIAGNOSIS — I719 Aortic aneurysm of unspecified site, without rupture: Secondary | ICD-10-CM | POA: Insufficient documentation

## 2017-10-15 DIAGNOSIS — Z952 Presence of prosthetic heart valve: Secondary | ICD-10-CM | POA: Diagnosis not present

## 2017-10-15 MED ORDER — IOPAMIDOL (ISOVUE-370) INJECTION 76%
100.0000 mL | Freq: Once | INTRAVENOUS | Status: AC | PRN
  Administered 2017-10-15: 100 mL via INTRAVENOUS

## 2017-10-17 ENCOUNTER — Telehealth: Payer: Self-pay | Admitting: *Deleted

## 2017-10-17 NOTE — Telephone Encounter (Signed)
Called patient with test results. No answer. Left message to call back.  

## 2017-10-17 NOTE — Telephone Encounter (Signed)
-----   Message from Imogene Burn, PA-C sent at 10/17/2017 11:19 AM EDT ----- Size of aneurysm is actually smaller than previously. Only 3.7 cm. It did detect that you have gallstones which you can discuss with PCP. Nothing to do with this unless they are bothering you

## 2017-10-23 ENCOUNTER — Ambulatory Visit (INDEPENDENT_AMBULATORY_CARE_PROVIDER_SITE_OTHER): Payer: BC Managed Care – PPO | Admitting: *Deleted

## 2017-10-23 DIAGNOSIS — Z952 Presence of prosthetic heart valve: Secondary | ICD-10-CM | POA: Diagnosis not present

## 2017-10-23 DIAGNOSIS — Z5181 Encounter for therapeutic drug level monitoring: Secondary | ICD-10-CM

## 2017-10-23 LAB — POCT INR: INR: 2.8 (ref 2.0–3.0)

## 2017-10-23 NOTE — Patient Instructions (Signed)
Description   Continue coumadin 1 tablet daily except 1/2 tablet on Mondays, Wednesdays and Fridays Recheck in 6 weeks Pending colonoscopy with Dr Laural Golden.  Not scheduled yet.

## 2017-11-01 ENCOUNTER — Other Ambulatory Visit: Payer: Self-pay | Admitting: Family Medicine

## 2017-11-30 ENCOUNTER — Other Ambulatory Visit: Payer: Self-pay | Admitting: Family Medicine

## 2017-12-04 ENCOUNTER — Ambulatory Visit (INDEPENDENT_AMBULATORY_CARE_PROVIDER_SITE_OTHER): Payer: BC Managed Care – PPO | Admitting: *Deleted

## 2017-12-04 DIAGNOSIS — Z952 Presence of prosthetic heart valve: Secondary | ICD-10-CM

## 2017-12-04 DIAGNOSIS — Z5181 Encounter for therapeutic drug level monitoring: Secondary | ICD-10-CM | POA: Diagnosis not present

## 2017-12-04 LAB — POCT INR: INR: 5.4 — AB (ref 2.0–3.0)

## 2017-12-04 NOTE — Patient Instructions (Signed)
Hold coumadin tonight and tomorrow night then resume 1 tablet daily except 1/2 tablet on Mondays, Wednesdays and Fridays Recheck in 1 week

## 2017-12-09 ENCOUNTER — Telehealth: Payer: Self-pay | Admitting: *Deleted

## 2017-12-09 ENCOUNTER — Encounter: Payer: Self-pay | Admitting: Gastroenterology

## 2017-12-09 ENCOUNTER — Ambulatory Visit: Payer: BC Managed Care – PPO | Admitting: Gastroenterology

## 2017-12-09 ENCOUNTER — Other Ambulatory Visit: Payer: Self-pay | Admitting: *Deleted

## 2017-12-09 VITALS — BP 134/62 | HR 56 | Temp 97.0°F | Ht 66.0 in | Wt 241.8 lb

## 2017-12-09 DIAGNOSIS — Z5181 Encounter for therapeutic drug level monitoring: Secondary | ICD-10-CM

## 2017-12-09 DIAGNOSIS — Z1211 Encounter for screening for malignant neoplasm of colon: Secondary | ICD-10-CM | POA: Insufficient documentation

## 2017-12-09 MED ORDER — PEG 3350-KCL-NA BICARB-NACL 420 G PO SOLR: 4000.0000 mL | Freq: Once | ORAL | 0 refills | Status: AC

## 2017-12-09 NOTE — Telephone Encounter (Signed)
Pt can hold coumadin 4 days before procedure.  She does not need lovenox bridge for mechanical AVR without RF for stoke/atrial fib. Thanks

## 2017-12-09 NOTE — Patient Instructions (Signed)
We will be in touch to schedule colonoscopy once I discuss sedation choices with Dr. Gala Romney.   I will also let Edrick Oh know of upcoming colonoscopy plans so she can assist with anticoagulation changes.

## 2017-12-09 NOTE — Telephone Encounter (Signed)
Tina Menghini, PA-C at 12/09/2017 10:00 AM   Status: Signed    Please schedule colonoscopy with propofol for screening purposes.   Day before procedure: glyburide 1/2 tablet bid, levemir 12 units at bedtime, jardiance 1/2 tab daily, metformin 1/2 tab bid  AM of TCS: Hold glyburide, levemir, jardiance, metformin  She will need to hold coumadin 4 days before procedure.   She will see Edrick Oh, RN at coumadin clinic for anticoagulation needs, ?LOVENOX BRIDGE.   I will send message to Lattie Haw when you give me dates.

## 2017-12-09 NOTE — Telephone Encounter (Signed)
-----   Message from Leeroy Bock, [facility] sent at 12/09/2017  1:00 PM EDT ----- She can hold for 5 days without a Lovenox bridge.  Thanks, Jinny Blossom  ----- Message ----- From: Malen Gauze, RN Sent: 12/09/2017  12:49 PM EDT To: Malen Gauze, RN, Erskine Emery, [facility], #  Pending colonoscopy.  S/P mech AVR 2017.  I don't see a need for lovenox per protocol.  Correct?

## 2017-12-09 NOTE — Telephone Encounter (Signed)
Called spoke with patient. She is scheduled for TCS W/ MAC on 01/09/18 at 8:15am. Patient is aware I will send prep to the pharmacy and I will mail her instructions. She is also aware she will need to hold coumadin 4 days prior to procedure and ? need lovenox bridge. She will be see coumadin clinic. I advised patient I will call back with pre-op appointment.

## 2017-12-09 NOTE — Telephone Encounter (Signed)
Pre-op scheduled for 01/07/18 at 1:45pm. LMOVM for pt. Letter sent

## 2017-12-09 NOTE — Progress Notes (Signed)
CC'D TO PCP °

## 2017-12-09 NOTE — Telephone Encounter (Signed)
[name], patient will be having colonoscopy on 01/09/18. Cardiology cleared and said we should send to coumadin clinic for management of her anticoagulation given St. Jude AVR in 2017.

## 2017-12-09 NOTE — Assessment & Plan Note (Addendum)
Screening colonoscopy in near future. Discussed patient's PMH and anesthesia concerns with Dr. Gala Romney, will plan for deep sedation with help of anesthesiology.  I have discussed the risks, alternatives, benefits with regards to but not limited to the risk of reaction to medication, bleeding, infection, perforation and the patient is agreeable to proceed. Written consent to be obtained.  She will need to come off coumadin for procedure and will have coumadin clinic assist with anticoagulation needs ie lovenox bridge, etc.

## 2017-12-09 NOTE — Progress Notes (Signed)
[place]

## 2017-12-09 NOTE — Progress Notes (Signed)
Please schedule colonoscopy with propofol for screening purposes.   Day before procedure: glyburide 1/2 tablet bid, levemir 12 units at bedtime, jardiance 1/2 tab daily, metformin 1/2 tab bid  AM of TCS: Hold glyburide, levemir, jardiance, metformin  She will need to hold coumadin 4 days before procedure.   She will see Edrick Oh, RN at coumadin clinic for anticoagulation needs, ?LOVENOX BRIDGE.   I will send message to Lattie Haw when you give me dates.

## 2017-12-09 NOTE — Progress Notes (Signed)
Primary Care Physician:  Kathyrn Drown, MD  Primary Gastroenterologist:  Garfield Cornea, MD   Chief Complaint  Patient presents with  . Colonoscopy    last tcs 12 yrs ago    HPI:  [name] is a 62 y.o. female with h/o St. Jude AVR 2017 on coumadin, HTN, DM, renal insufficiency presenting to schedule screening colonoscopy. She was seen by cardiology in 09/2017 for clearance to schedule colonoscopy.  She has been cleared and we will work with coumadin clinic with regards to her anticoagulation.   From a GI standpoint she is doing well. No bowel concerns. She is regular. No melena, brbpr. No abd pain. No UGI symptoms.   She is concerned about being put to sleep although she admits she has never had any problems.   She takes hydrocodone about once every six months.   Current Outpatient Medications  Medication Sig Dispense Refill  . ACCU-CHEK FASTCLIX LANCETS MISC USE DAILY AS DIRECTED. 102 each 5  . acetaminophen (TYLENOL) 325 MG tablet Take 2 tablets (650 mg total) by mouth every 6 (six) hours as needed for mild pain.    Marland Kitchen amLODipine (NORVASC) 10 MG tablet Take 1 tablet (10 mg total) by mouth daily. 30 tablet 5  . aspirin 81 MG tablet Take 81 mg by mouth daily.    Marland Kitchen glucose blood (ACCU-CHEK AVIVA PLUS) test strip TEST DAILY AS DIRECTED. 50 each 5  . glyBURIDE (DIABETA) 5 MG tablet Take one po Q am and one po with supper 60 tablet 5  . HYDROcodone-acetaminophen (NORCO/VICODIN) 5-325 MG tablet Take 1 tablet by mouth every 4 (four) hours as needed. 30 tablet 0  . Insulin Detemir (LEVEMIR FLEXTOUCH) 100 UNIT/ML Pen INJECT 24 UNITS INTO THE SKIN AT BEDTIME. 15 mL 5  . insulin detemir (LEVEMIR) 100 UNIT/ML injection Inject 0.24 mLs (24 Units total) into the skin at bedtime. 10 mL 5  . JARDIANCE 25 MG TABS tablet TAKE 1 TABLET BY MOUTH DAILY. 30 tablet 3  . lisinopril (PRINIVIL,ZESTRIL) 40 MG tablet Take 1 tablet (40 mg total) by mouth daily. 30 tablet 5  . metFORMIN (GLUCOPHAGE) 500 MG  tablet Take 1 tablet (500 mg total) by mouth 2 (two) times daily with a meal. 180 tablet 5  . metoprolol tartrate (LOPRESSOR) 50 MG tablet TAKE (1) TABLET BY MOUTH TWICE DAILY. 180 tablet 0  . mometasone (ELOCON) 0.1 % cream Apply to affected area bid prn 45 g 1  . pravastatin (PRAVACHOL) 20 MG tablet Take one po QHS 30 tablet 5  . SURE COMFORT PEN NEEDLES 31G X 5 MM MISC USE AS DIRECTED ONCE DAILY. 100 each 0  . terconazole (TERAZOL 7) 0.4 % vaginal cream Place 1 applicator vaginally at bedtime. 45 g 2  . warfarin (COUMADIN) 5 MG tablet Take 1 tablet daily except 1/2 tablet on Mondays, Wednesdays and Fridays 90 tablet 3   No current facility-administered medications for this visit.     Allergies as of 12/09/2017 - Review Complete 12/09/2017  Allergen Reaction Noted  . No known allergies  01/02/2016    Past Medical History:  Diagnosis Date  . Aortic stenosis    Mild 5/12  . Aortic stenosis   . Dyspnea    with exertion  . Essential hypertension, benign    takes Lisinopril daily  . Insomnia    takes Trazodone nightly as needed  . Mixed hyperlipidemia    takes Pravastatin daily  . Nocturia   . Numbness  both feet  . Restless leg syndrome   . Ruptured disk   . Type 2 diabetes mellitus (HCC)    takes Glyburide,Metformin,Jardiance,and Levemir daily.Average fasting blood sugar 120    Past Surgical History:  Procedure Laterality Date  . AORTIC VALVE REPLACEMENT N/A 01/03/2016   Procedure: AORTIC VALVE REPLACEMENT (AVR);  Surgeon: Gaye Pollack, MD;  Location: [facility];  Service: Open Heart Surgery;  Laterality: N/A;  . CARDIAC CATHETERIZATION N/A 12/16/2015   Procedure: Right/Left Heart Cath and Coronary Angiography;  Surgeon: Belva Crome, MD;  Location: [facility];  Service: Cardiovascular;  Laterality: N/A;  . COLONOSCOPY  2007   Dr. Gala Romney: single anal papilla, otherwise unremarkable.   . TEE WITHOUT CARDIOVERSION N/A 01/03/2016   Procedure: TRANSESOPHAGEAL  ECHOCARDIOGRAM (TEE);  Surgeon: Gaye Pollack, MD;  Location: [facility];  Service: Open Heart Surgery;  Laterality: N/A;  . TOE SURGERY     left foot middle     Family History  Problem Relation Age of Onset  . Coronary artery disease Father        Diagnosed in his 52s  . Hypertension Father   . Diabetes type II Mother   . Hypertension Mother   . Diabetes Mother   . Diabetes type II Brother   . Diabetes Brother   . Hypertension Sister   . Hyperlipidemia Sister   . Colon cancer Neg Hx     Social History   Socioeconomic History  . Marital status: Married    Spouse name: Not on file  . Number of children: Not on file  . Years of education: Not on file  . Highest education level: Not on file  Occupational History  . Occupation: Network engineer    Comment: Psychiatrist: [employer]  Social Needs  . Financial resource strain: Not on file  . Food insecurity:    Worry: Not on file    Inability: Not on file  . Transportation needs:    Medical: Not on file    Non-medical: Not on file  Tobacco Use  . Smoking status: Never Smoker  . Smokeless tobacco: Never Used  Substance and Sexual Activity  . Alcohol use: No  . Drug use: No  . Sexual activity: Not Currently    Birth control/protection: Post-menopausal  Lifestyle  . Physical activity:    Days per week: Not on file    Minutes per session: Not on file  . Stress: Not on file  Relationships  . Social connections:    Talks on phone: Not on file    Gets together: Not on file    Attends religious service: Not on file    Active member of club or organization: Not on file    Attends meetings of clubs or organizations: Not on file    Relationship status: Not on file  . Intimate partner violence:    Fear of current or ex partner: Not on file    Emotionally abused: Not on file    Physically abused: Not on file    Forced sexual activity: Not on file  Other Topics Concern  . Not on file  Social History Narrative  .  Not on file      ROS:  General: Negative for anorexia, weight loss, fever, chills, fatigue, weakness. Eyes: Negative for vision changes.  ENT: Negative for hoarseness, difficulty swallowing , nasal congestion. CV: Negative for chest pain, angina, palpitations, dyspnea on exertion, peripheral edema.  Respiratory:  Negative for dyspnea at rest, dyspnea on exertion, cough, sputum, wheezing.  GI: See history of present illness. GU:  Negative for dysuria, hematuria, urinary incontinence, urinary frequency, nocturnal urination.  MS: Negative for joint pain, low back pain.  Derm: Negative for rash or itching.  Neuro: Negative for weakness, abnormal sensation, seizure, frequent headaches, memory loss, confusion.  Psych: Negative for anxiety, depression, suicidal ideation, hallucinations.  Endo: Negative for unusual weight change.  Heme: Negative for bruising or bleeding. Allergy: Negative for rash or hives.    Physical Examination:  BP 134/62   Pulse (!) 56   Temp (!) 97 F (36.1 C) (Oral)   Ht 5\' 6"  (1.676 m)   Wt 241 lb 12.8 oz (109.7 kg)   BMI 39.03 kg/m    General: Well-nourished, well-developed in no acute distress.  Head: Normocephalic, atraumatic.   Eyes: Conjunctiva pink, no icterus. Mouth: Oropharyngeal mucosa moist and pink , no lesions erythema or exudate. Neck: Supple without thyromegaly, masses, or lymphadenopathy.  Lungs: Clear to auscultation bilaterally.  Heart: Regular rate and rhythm, no murmurs rubs or gallops.  Abdomen: Bowel sounds are normal, nontender, nondistended, no hepatosplenomegaly or masses, no abdominal bruits or    hernia , no rebound or guarding.   Rectal: not performed Extremities: No lower extremity edema. No clubbing or deformities.  Neuro: Alert and oriented x 4 , grossly normal neurologically.  Skin: Warm and dry, no rash or jaundice.   Psych: Alert and cooperative, normal mood and affect.  Labs: Lab Results  Component Value Date    CREATININE 1.14 (H) 10/11/2017   BUN 24 10/11/2017   NA 137 10/11/2017   K 4.7 10/11/2017   CL 102 10/11/2017   CO2 27 10/11/2017   Lab Results  Component Value Date   ALT 13 07/03/2017   AST 18 07/03/2017   ALKPHOS 94 07/03/2017   BILITOT 0.5 07/03/2017   Lab Results  Component Value Date   WBC 10.4 07/03/2017   HGB 13.6 07/03/2017   HCT 41.3 07/03/2017   MCV 86 07/03/2017   PLT 319 07/03/2017   Lab Results  Component Value Date   INR 5.4 (A) 12/04/2017   INR 2.8 10/23/2017   INR 2.6 09/11/2017     Imaging Studies: No results found.

## 2017-12-10 NOTE — Telephone Encounter (Signed)
Patient called back and is aware. She also states she is aware of pre-op appt.

## 2017-12-10 NOTE — Telephone Encounter (Signed)
See note from [facility]. Patient can hold coumadin for four days before procedure.   DOES NOT NEED LOVENOX BRIDGE.

## 2017-12-10 NOTE — Telephone Encounter (Signed)
LMOVM for pt 

## 2017-12-11 ENCOUNTER — Ambulatory Visit (INDEPENDENT_AMBULATORY_CARE_PROVIDER_SITE_OTHER): Payer: BC Managed Care – PPO | Admitting: *Deleted

## 2017-12-11 DIAGNOSIS — Z5181 Encounter for therapeutic drug level monitoring: Secondary | ICD-10-CM | POA: Diagnosis not present

## 2017-12-11 DIAGNOSIS — Z952 Presence of prosthetic heart valve: Secondary | ICD-10-CM

## 2017-12-11 LAB — POCT INR: INR: 2.6 (ref 2.0–3.0)

## 2017-12-11 NOTE — Patient Instructions (Signed)
Continue coumadin 1 tablet daily except 1/2 tablet on Mondays, Wednesdays and Fridays Recheck in 3 weeks

## 2017-12-27 ENCOUNTER — Other Ambulatory Visit: Payer: Self-pay | Admitting: Family Medicine

## 2018-01-01 NOTE — Patient Instructions (Signed)
[name]  01/01/2018     @PREFPERIOPPHARMACY @   Your procedure is scheduled on  01/09/2018   Report to [facility] at  645  A.M.  Call this number if you have problems the morning of surgery:  [phone]   Remember:  Follow the diet and prep instructions given to you by Dr Roseanne Kaufman office.                        Take these medicines the morning of surgery with A SIP OF WATER  Amlodipine, hydrocodone(if needed), lisinopril, metoprolol.     Do not wear jewelry, make-up or nail polish.  Do not wear lotions, powders, or perfumes, or deodorant.  Do not shave 48 hours prior to surgery.  Men may shave face and neck.  Do not bring valuables to the hospital.  [facility] is not responsible for any belongings or valuables.  Contacts, dentures or bridgework may not be worn into surgery.  Leave your suitcase in the car.  After surgery it may be brought to your room.  For patients admitted to the hospital, discharge time will be determined by your treatment team.  Patients discharged the day of surgery will not be allowed to drive home.   Name and phone number of your driver:   Family Special instructions:  DO NOT take any medications for diabetes the morning of your procedure.  Please read over the following fact sheets that you were given. Anesthesia Post-op Instructions and Care and Recovery After Surgery       Colonoscopy, Adult A colonoscopy is an exam to look at the large intestine. It is done to check for problems, such as:  Lumps (tumors).  Growths (polyps).  Swelling (inflammation).  Bleeding.  What happens before the procedure? Eating and drinking Follow instructions from your doctor about eating and drinking. These instructions may include:  A few days before the procedure - follow a low-fiber diet. ? Avoid nuts. ? Avoid seeds. ? Avoid dried fruit. ? Avoid raw fruits. ? Avoid vegetables.  1-3 days before the procedure - follow a clear  liquid diet. Avoid liquids that have red or purple dye. Drink only clear liquids, such as: ? Clear broth or bouillon. ? Black coffee or tea. ? Clear juice. ? Clear soft drinks or sports drinks. ? Gelatin dessert. ? Popsicles.  On the day of the procedure - do not eat or drink anything during the 2 hours before the procedure.  Bowel prep If you were prescribed an oral bowel prep:  Take it as told by your doctor. Starting the day before your procedure, you will need to drink a lot of liquid. The liquid will cause you to poop (have bowel movements) until your poop is almost clear or light green.  If your skin or butt gets irritated from diarrhea, you may: ? Wipe the area with wipes that have medicine in them, such as adult wet wipes with aloe and vitamin E. ? Put something on your skin that soothes the area, such as petroleum jelly.  If you throw up (vomit) while drinking the bowel prep, take a break for up to 60 minutes. Then begin the bowel prep again. If you keep throwing up and you cannot take the bowel prep without throwing up, call your doctor.  General instructions  Ask your doctor about changing or stopping your normal medicines. This is important if you  take diabetes medicines or blood thinners.  Plan to have someone take you home from the hospital or clinic. What happens during the procedure?  An IV tube may be put into one of your veins.  You will be given medicine to help you relax (sedative).  To reduce your risk of infection: ? Your doctors will wash their hands. ? Your anal area will be washed with soap.  You will be asked to lie on your side with your knees bent.  Your doctor will get a long, thin, flexible tube ready. The tube will have a camera and a light on the end.  The tube will be put into your anus.  The tube will be gently put into your large intestine.  Air will be delivered into your large intestine to keep it open. You may feel some pressure or  cramping.  The camera will be used to take photos.  A small tissue sample may be removed from your body to be looked at under a microscope (biopsy). If any possible problems are found, the tissue will be sent to a lab for testing.  If small growths are found, your doctor may remove them and have them checked for cancer.  The tube that was put into your anus will be slowly removed. The procedure may vary among doctors and hospitals. What happens after the procedure?  Your doctor will check on you often until the medicines you were given have worn off.  Do not drive for 24 hours after the procedure.  You may have a small amount of blood in your poop.  You may pass gas.  You may have mild cramps or bloating in your belly (abdomen).  It is up to you to get the results of your procedure. Ask your doctor, or the department performing the procedure, when your results will be ready. This information is not intended to replace advice given to you by your health care provider. Make sure you discuss any questions you have with your health care provider. Document Released: 03/24/2010 Document Revised: 12/21/2015 Document Reviewed: 05/03/2015 Elsevier Interactive Patient Education  2017 [employer]  Colonoscopy, Adult, Care After This sheet gives you information about how to care for yourself after your procedure. Your health care provider may also give you more specific instructions. If you have problems or questions, contact your health care provider. What can I expect after the procedure? After the procedure, it is common to have:  A small amount of blood in your stool for 24 hours after the procedure.  Some gas.  Mild abdominal cramping or bloating.  Follow these instructions at home: General instructions   For the first 24 hours after the procedure: ? Do not drive or use machinery. ? Do not sign important documents. ? Do not drink alcohol. ? Do your regular daily activities  at a slower pace than normal. ? Eat soft, easy-to-digest foods. ? Rest often.  Take over-the-counter or prescription medicines only as told by your health care provider.  It is up to you to get the results of your procedure. Ask your health care provider, or the department performing the procedure, when your results will be ready. Relieving cramping and bloating  Try walking around when you have cramps or feel bloated.  Apply heat to your abdomen as told by your health care provider. Use a heat source that your health care provider recommends, such as a moist heat pack or a heating pad. ? Place a towel between your  skin and the heat source. ? Leave the heat on for 20-30 minutes. ? Remove the heat if your skin turns bright red. This is especially important if you are unable to feel pain, heat, or cold. You may have a greater risk of getting burned. Eating and drinking  Drink enough fluid to keep your urine clear or pale yellow.  Resume your normal diet as instructed by your health care provider. Avoid heavy or fried foods that are hard to digest.  Avoid drinking alcohol for as long as instructed by your health care provider. Contact a health care provider if:  You have blood in your stool 2-3 days after the procedure. Get help right away if:  You have more than a small spotting of blood in your stool.  You pass large blood clots in your stool.  Your abdomen is swollen.  You have nausea or vomiting.  You have a fever.  You have increasing abdominal pain that is not relieved with medicine. This information is not intended to replace advice given to you by your health care provider. Make sure you discuss any questions you have with your health care provider. Document Released: 10/04/2003 Document Revised: 11/14/2015 Document Reviewed: 05/03/2015 Elsevier Interactive Patient Education  2018 [place] Anesthesia is a term that refers to techniques,  procedures, and medicines that help a person stay safe and comfortable during a medical procedure. Monitored anesthesia care, or sedation, is one type of anesthesia. Your anesthesia specialist may recommend sedation if you will be having a procedure that does not require you to be unconscious, such as:  Cataract surgery.  A dental procedure.  A biopsy.  A colonoscopy.  During the procedure, you may receive a medicine to help you relax (sedative). There are three levels of sedation:  Mild sedation. At this level, you may feel awake and relaxed. You will be able to follow directions.  Moderate sedation. At this level, you will be sleepy. You may not remember the procedure.  Deep sedation. At this level, you will be asleep. You will not remember the procedure.  The more medicine you are given, the deeper your level of sedation will be. Depending on how you respond to the procedure, the anesthesia specialist may change your level of sedation or the type of anesthesia to fit your needs. An anesthesia specialist will monitor you closely during the procedure. Let your health care provider know about:  Any allergies you have.  All medicines you are taking, including vitamins, herbs, eye drops, creams, and over-the-counter medicines.  Any use of steroids (by mouth or as a cream).  Any problems you or family members have had with sedatives and anesthetic medicines.  Any blood disorders you have.  Any surgeries you have had.  Any medical conditions you have, such as sleep apnea.  Whether you are pregnant or may be pregnant.  Any use of cigarettes, alcohol, or street drugs. What are the risks? Generally, this is a safe procedure. However, problems may occur, including:  Getting too much medicine (oversedation).  Nausea.  Allergic reaction to medicines.  Trouble breathing. If this happens, a breathing tube may be used to help with breathing. It will be removed when you are awake and  breathing on your own.  Heart trouble.  Lung trouble.  Before the procedure Staying hydrated Follow instructions from your health care provider about hydration, which may include:  Up to 2 hours before the procedure - you may continue to drink  clear liquids, such as water, clear fruit juice, black coffee, and plain tea.  Eating and drinking restrictions Follow instructions from your health care provider about eating and drinking, which may include:  8 hours before the procedure - stop eating heavy meals or foods such as meat, fried foods, or fatty foods.  6 hours before the procedure - stop eating light meals or foods, such as toast or cereal.  6 hours before the procedure - stop drinking milk or drinks that contain milk.  2 hours before the procedure - stop drinking clear liquids.  Medicines Ask your health care provider about:  Changing or stopping your regular medicines. This is especially important if you are taking diabetes medicines or blood thinners.  Taking medicines such as aspirin and ibuprofen. These medicines can thin your blood. Do not take these medicines before your procedure if your health care provider instructs you not to.  Tests and exams  You will have a physical exam.  You may have blood tests done to show: ? How well your kidneys and liver are working. ? How well your blood can clot.  General instructions  Plan to have someone take you home from the hospital or clinic.  If you will be going home right after the procedure, plan to have someone with you for 24 hours.  What happens during the procedure?  Your blood pressure, heart rate, breathing, level of pain and overall condition will be monitored.  An IV tube will be inserted into one of your veins.  Your anesthesia specialist will give you medicines as needed to keep you comfortable during the procedure. This may mean changing the level of sedation.  The procedure will be performed. After  the procedure  Your blood pressure, heart rate, breathing rate, and blood oxygen level will be monitored until the medicines you were given have worn off.  Do not drive for 24 hours if you received a sedative.  You may: ? Feel sleepy, clumsy, or nauseous. ? Feel forgetful about what happened after the procedure. ? Have a sore throat if you had a breathing tube during the procedure. ? Vomit. This information is not intended to replace advice given to you by your health care provider. Make sure you discuss any questions you have with your health care provider. Document Released: 11/15/2004 Document Revised: 07/29/2015 Document Reviewed: 06/12/2015 Elsevier Interactive Patient Education  2018 [place], Care After These instructions provide you with information about caring for yourself after your procedure. Your health care provider may also give you more specific instructions. Your treatment has been planned according to current medical practices, but problems sometimes occur. Call your health care provider if you have any problems or questions after your procedure. What can I expect after the procedure? After your procedure, it is common to:  Feel sleepy for several hours.  Feel clumsy and have poor balance for several hours.  Feel forgetful about what happened after the procedure.  Have poor judgment for several hours.  Feel nauseous or vomit.  Have a sore throat if you had a breathing tube during the procedure.  Follow these instructions at home: For at least 24 hours after the procedure:   Do not: ? Participate in activities in which you could fall or become injured. ? Drive. ? Use heavy machinery. ? Drink alcohol. ? Take sleeping pills or medicines that cause drowsiness. ? Make important decisions or sign legal documents. ? Take care of children on your own.  Rest. Eating and drinking  Follow the diet that is recommended by your health  care provider.  If you vomit, drink water, juice, or soup when you can drink without vomiting.  Make sure you have little or no nausea before eating solid foods. General instructions  Have a responsible adult stay with you until you are awake and alert.  Take over-the-counter and prescription medicines only as told by your health care provider.  If you smoke, do not smoke without supervision.  Keep all follow-up visits as told by your health care provider. This is important. Contact a health care provider if:  You keep feeling nauseous or you keep vomiting.  You feel light-headed.  You develop a rash.  You have a fever. Get help right away if:  You have trouble breathing. This information is not intended to replace advice given to you by your health care provider. Make sure you discuss any questions you have with your health care provider. Document Released: 06/12/2015 Document Revised: 10/12/2015 Document Reviewed: 06/12/2015 Elsevier Interactive Patient Education  [employer].

## 2018-01-02 ENCOUNTER — Other Ambulatory Visit: Payer: Self-pay | Admitting: Family Medicine

## 2018-01-06 ENCOUNTER — Ambulatory Visit (INDEPENDENT_AMBULATORY_CARE_PROVIDER_SITE_OTHER): Payer: BC Managed Care – PPO | Admitting: *Deleted

## 2018-01-06 DIAGNOSIS — Z952 Presence of prosthetic heart valve: Secondary | ICD-10-CM

## 2018-01-06 DIAGNOSIS — Z5181 Encounter for therapeutic drug level monitoring: Secondary | ICD-10-CM | POA: Diagnosis not present

## 2018-01-06 LAB — POCT INR: INR: 3.1 — AB (ref 2.0–3.0)

## 2018-01-06 NOTE — Patient Instructions (Signed)
Stopped coumadin Sunday for colonoscopy on 01/09/18 Continue coumadin 1 tablet daily except 1/2 tablet on Mondays, Wednesdays and Fridays Recheck in 2.5 weeks

## 2018-01-07 ENCOUNTER — Encounter (HOSPITAL_COMMUNITY)
Admission: RE | Admit: 2018-01-07 | Discharge: 2018-01-07 | Disposition: A | Discharge status: Home / Self Care | Payer: BC Managed Care – PPO | Source: Ambulatory Visit | Attending: Internal Medicine | Admitting: Internal Medicine

## 2018-01-07 ENCOUNTER — Other Ambulatory Visit: Payer: Self-pay

## 2018-01-07 ENCOUNTER — Telehealth: Payer: Self-pay | Admitting: *Deleted

## 2018-01-07 ENCOUNTER — Encounter (HOSPITAL_COMMUNITY): Payer: Self-pay

## 2018-01-07 DIAGNOSIS — Z1211 Encounter for screening for malignant neoplasm of colon: Secondary | ICD-10-CM | POA: Insufficient documentation

## 2018-01-07 DIAGNOSIS — Z01818 Encounter for other preprocedural examination: Secondary | ICD-10-CM | POA: Diagnosis present

## 2018-01-07 LAB — CBC WITH DIFFERENTIAL/PLATELET
ABS IMMATURE GRANULOCYTES: 0.04 10*3/uL (ref 0.00–0.07)
BASOS PCT: 1 %
Basophils Absolute: 0.1 10*3/uL (ref 0.0–0.1)
Eosinophils Absolute: 0.2 10*3/uL (ref 0.0–0.5)
Eosinophils Relative: 3 %
HCT: 38 % (ref 36.0–46.0)
Hemoglobin: 12.7 g/dL (ref 12.0–15.0)
Immature Granulocytes: 1 %
Lymphocytes Relative: 30 %
Lymphs Abs: 2.4 10*3/uL (ref 0.7–4.0)
MCH: 29.7 pg (ref 26.0–34.0)
MCHC: 33.4 g/dL (ref 30.0–36.0)
MCV: 89 fL (ref 80.0–100.0)
MONO ABS: 1 10*3/uL (ref 0.1–1.0)
Monocytes Relative: 12 %
NEUTROS ABS: 4.3 10*3/uL (ref 1.7–7.7)
NEUTROS PCT: 53 %
PLATELETS: 313 10*3/uL (ref 150–400)
RBC: 4.27 MIL/uL (ref 3.87–5.11)
RDW: 12.4 % (ref 11.5–15.5)
WBC: 8.1 10*3/uL (ref 4.0–10.5)
nRBC: 0 % (ref 0.0–0.2)

## 2018-01-07 LAB — BASIC METABOLIC PANEL
Anion gap: 14 (ref 5–15)
BUN: 51 mg/dL — AB (ref 8–23)
CO2: 23 mmol/L (ref 22–32)
CREATININE: 1.89 mg/dL — AB (ref 0.44–1.00)
Calcium: 8.8 mg/dL — ABNORMAL LOW (ref 8.9–10.3)
Chloride: 92 mmol/L — ABNORMAL LOW (ref 98–111)
GFR, EST AFRICAN AMERICAN: 32 mL/min — AB (ref >60)
GFR, EST NON AFRICAN AMERICAN: 27 mL/min — AB (ref >60)
Glucose, Bld: 579 mg/dL (ref 70–99)
POTASSIUM: 4.3 mmol/L (ref 3.5–5.1)
SODIUM: 129 mmol/L — AB (ref 135–145)

## 2018-01-07 NOTE — Telephone Encounter (Signed)
Spoke with patient. She checked her BS and it was 500. She reports she ate a "really big lunch and a cupcake" since she had to fast after lunch. She reports she feels fine.

## 2018-01-07 NOTE — Telephone Encounter (Signed)
[name] from [facility] called stating they have a critical lab result glucose of 579. They are wanting to know what to do? She can be reached at [phone]. She requests a call back today. Patient scheduled for TCS on 01/09/18. Please advise AB as RMR is on admin today

## 2018-01-07 NOTE — Telephone Encounter (Signed)
Called patient and is aware of recs per AB. She will monitor. Any changes she will seek emergency care. She will go ahead and take her night time insulin now. Called [facility] and she was already gone for the day. I spoke with Tiffany (on call) and she is aware.

## 2018-01-07 NOTE — Telephone Encounter (Signed)
Patient without any symptoms when staff spoke with her. Blood sugar coming down. Labs available not indicative of concern for DKA. Go ahead and take evening dose of standard diabetes medications. Monitor for any altered level of consciousness, mental status changes, etc. Call tomorrow morning with update.

## 2018-01-08 ENCOUNTER — Telehealth: Payer: Self-pay | Admitting: Internal Medicine

## 2018-01-08 NOTE — Telephone Encounter (Signed)
Pt called to let the nurse know that her sugar level this morning was 223. She is scheduled for procedure with RMR on 11/7. [phone]

## 2018-01-08 NOTE — Pre-Procedure Instructions (Signed)
Bmet routed to Neil Crouch, [place].

## 2018-01-08 NOTE — Telephone Encounter (Signed)
LMOVM

## 2018-01-08 NOTE — Telephone Encounter (Signed)
Continue current diabetes medications. Should be on clear liquids and should improve. Continue to monitor sugars today, avoid sweets.   Can take full dose glyburide and metformin if sugars remain above 200 today.

## 2018-01-08 NOTE — Telephone Encounter (Signed)
fowarding to AB/LSL.

## 2018-01-08 NOTE — Telephone Encounter (Signed)
Patient is aware 

## 2018-01-09 ENCOUNTER — Encounter (HOSPITAL_COMMUNITY): Payer: Self-pay | Admitting: *Deleted

## 2018-01-09 ENCOUNTER — Ambulatory Visit (HOSPITAL_COMMUNITY)
Admission: RE | Admit: 2018-01-09 | Discharge: 2018-01-09 | Disposition: A | Discharge status: Home / Self Care | Payer: BC Managed Care – PPO | Source: Ambulatory Visit | Attending: Internal Medicine | Admitting: Internal Medicine

## 2018-01-09 ENCOUNTER — Ambulatory Visit (HOSPITAL_COMMUNITY): Payer: BC Managed Care – PPO | Admitting: Anesthesiology

## 2018-01-09 ENCOUNTER — Encounter (HOSPITAL_COMMUNITY)
Admission: RE | Disposition: A | Discharge status: Home / Self Care | Payer: Self-pay | Source: Ambulatory Visit | Attending: Internal Medicine

## 2018-01-09 DIAGNOSIS — Z8249 Family history of ischemic heart disease and other diseases of the circulatory system: Secondary | ICD-10-CM | POA: Diagnosis not present

## 2018-01-09 DIAGNOSIS — Z952 Presence of prosthetic heart valve: Secondary | ICD-10-CM | POA: Diagnosis not present

## 2018-01-09 DIAGNOSIS — Z79899 Other long term (current) drug therapy: Secondary | ICD-10-CM | POA: Insufficient documentation

## 2018-01-09 DIAGNOSIS — I10 Essential (primary) hypertension: Secondary | ICD-10-CM | POA: Insufficient documentation

## 2018-01-09 DIAGNOSIS — G4700 Insomnia, unspecified: Secondary | ICD-10-CM | POA: Insufficient documentation

## 2018-01-09 DIAGNOSIS — Z7982 Long term (current) use of aspirin: Secondary | ICD-10-CM | POA: Insufficient documentation

## 2018-01-09 DIAGNOSIS — Z794 Long term (current) use of insulin: Secondary | ICD-10-CM | POA: Diagnosis not present

## 2018-01-09 DIAGNOSIS — G2581 Restless legs syndrome: Secondary | ICD-10-CM | POA: Diagnosis not present

## 2018-01-09 DIAGNOSIS — E782 Mixed hyperlipidemia: Secondary | ICD-10-CM | POA: Diagnosis not present

## 2018-01-09 DIAGNOSIS — Z1211 Encounter for screening for malignant neoplasm of colon: Secondary | ICD-10-CM | POA: Insufficient documentation

## 2018-01-09 DIAGNOSIS — I350 Nonrheumatic aortic (valve) stenosis: Secondary | ICD-10-CM | POA: Diagnosis not present

## 2018-01-09 DIAGNOSIS — Z7901 Long term (current) use of anticoagulants: Secondary | ICD-10-CM | POA: Diagnosis not present

## 2018-01-09 DIAGNOSIS — K640 First degree hemorrhoids: Secondary | ICD-10-CM | POA: Diagnosis not present

## 2018-01-09 DIAGNOSIS — E119 Type 2 diabetes mellitus without complications: Secondary | ICD-10-CM | POA: Insufficient documentation

## 2018-01-09 DIAGNOSIS — K621 Rectal polyp: Secondary | ICD-10-CM | POA: Diagnosis not present

## 2018-01-09 HISTORY — PX: COLONOSCOPY WITH PROPOFOL: SHX5780

## 2018-01-09 HISTORY — PX: POLYPECTOMY: SHX5525

## 2018-01-09 LAB — GLUCOSE, CAPILLARY
GLUCOSE-CAPILLARY: 314 mg/dL — AB (ref 70–99)
GLUCOSE-CAPILLARY: 341 mg/dL — AB (ref 70–99)

## 2018-01-09 SURGERY — COLONOSCOPY WITH PROPOFOL
Anesthesia: Monitor Anesthesia Care

## 2018-01-09 MED ORDER — CHLORHEXIDINE GLUCONATE CLOTH 2 % EX PADS: 6.0000 | MEDICATED_PAD | Freq: Once | CUTANEOUS | Status: DC

## 2018-01-09 MED ORDER — LACTATED RINGERS IV SOLN
INTRAVENOUS | Status: DC
  Administered 2018-01-09: 1000 mL via INTRAVENOUS

## 2018-01-09 MED ORDER — PROPOFOL 10 MG/ML IV BOLUS
INTRAVENOUS | Status: AC
  Filled 2018-01-09: qty 40

## 2018-01-09 MED ORDER — STERILE WATER FOR IRRIGATION IR SOLN
Status: DC | PRN
  Administered 2018-01-09: 100 mL

## 2018-01-09 MED ORDER — MIDAZOLAM HCL 2 MG/2ML IJ SOLN
INTRAMUSCULAR | Status: AC
  Filled 2018-01-09: qty 2

## 2018-01-09 MED ORDER — PROPOFOL 500 MG/50ML IV EMUL
INTRAVENOUS | Status: DC | PRN
  Administered 2018-01-09: 135 ug/kg/min via INTRAVENOUS

## 2018-01-09 MED ORDER — MIDAZOLAM HCL 5 MG/5ML IJ SOLN
INTRAMUSCULAR | Status: DC | PRN
  Administered 2018-01-09: 2 mg via INTRAVENOUS

## 2018-01-09 NOTE — Transfer of Care (Signed)
Immediate Anesthesia Transfer of Care Note  Patient: [name]  Procedure(s) Performed: COLONOSCOPY WITH PROPOFOL (N/A ) POLYPECTOMY  Patient Location: PACU  Anesthesia Type:MAC  Level of Consciousness: drowsy  Airway & Oxygen Therapy: Patient Spontanous Breathing and Patient connected to nasal cannula oxygen  Post-op Assessment: Report given to RN and Post -op Vital signs reviewed and stable  Post vital signs: Reviewed and stable  Last Vitals:  Vitals Value Taken Time  BP    Temp    Pulse    Resp    SpO2      Last Pain:  Vitals:   01/09/18 0835  TempSrc:   PainSc: 0-No pain      Patients Stated Pain Goal: 6 ([id number]/14/23 [id number])  Complications: No apparent anesthesia complications

## 2018-01-09 NOTE — Anesthesia Postprocedure Evaluation (Signed)
Anesthesia Post Note  Patient: [name]  Procedure(s) Performed: COLONOSCOPY WITH PROPOFOL (N/A ) POLYPECTOMY  Patient location during evaluation: PACU Anesthesia Type: MAC Level of consciousness: awake and patient cooperative Pain management: pain level controlled Vital Signs Assessment: post-procedure vital signs reviewed and stable Respiratory status: spontaneous breathing, nonlabored ventilation and respiratory function stable Cardiovascular status: blood pressure returned to baseline Postop Assessment: no apparent nausea or vomiting Anesthetic complications: no     Last Vitals:  Vitals:   01/09/18 0715 01/09/18 0837  BP: (!) 150/61 (!) 84/46  Pulse: 78 73  Resp:  (!) 22  Temp: (!) 36.4 C 37.1 C  SpO2: 98% 100%    Last Pain:  Vitals:   01/09/18 0835  TempSrc:   PainSc: 0-No pain                 Avalee Castrellon J

## 2018-01-09 NOTE — Anesthesia Preprocedure Evaluation (Signed)
Anesthesia Evaluation  Patient identified by MRN, date of birth, ID band Patient awake    Reviewed: Allergy & Precautions, H&P , NPO status , Patient's Chart, lab work & pertinent test results, reviewed documented beta blocker date and time   Airway Mallampati: II  TM Distance: >3 FB Neck ROM: full    Dental no notable dental hx.    Pulmonary neg pulmonary ROS, shortness of breath,    Pulmonary exam normal breath sounds clear to auscultation       Cardiovascular Exercise Tolerance: Good hypertension, negative cardio ROS   Rhythm:regular Rate:Normal     Neuro/Psych negative neurological ROS  negative psych ROS   GI/Hepatic negative GI ROS, Neg liver ROS,   Endo/Other  negative endocrine ROSdiabetes  Renal/GU Renal diseasenegative Renal ROS  negative genitourinary   Musculoskeletal negative musculoskeletal ROS (+)   Abdominal   Peds negative pediatric ROS (+)  Hematology negative hematology ROS (+)   Anesthesia Other Findings   Reproductive/Obstetrics negative OB ROS                             Anesthesia Physical Anesthesia Plan  ASA: III  Anesthesia Plan: MAC   Post-op Pain Management:    Induction:   PONV Risk Score and Plan:   Airway Management Planned:   Additional Equipment:   Intra-op Plan:   Post-operative Plan:   Informed Consent: I have reviewed the patients History and Physical, chart, labs and discussed the procedure including the risks, benefits and alternatives for the proposed anesthesia with the patient or authorized representative who has indicated his/her understanding and acceptance.   Dental Advisory Given  Plan Discussed with: CRNA  Anesthesia Plan Comments:         Anesthesia Quick Evaluation

## 2018-01-09 NOTE — H&P (Signed)
@LOGO @   Primary Care Physician:  Kathyrn Drown, MD Primary Gastroenterologist:  Dr. Gala Romney  Pre-Procedure History & Physical: HPI:  [name] is a 62 y.o. female is here for a screening colonoscopy.  Negative colonoscopy 2007.  Prosthetic valve in the aortic position.  Chronically anticoagulated Coumadin stopped 4 days ago.  No GI symptoms.  Past Medical History:  Diagnosis Date  . Aortic stenosis    Mild 5/12  . Aortic stenosis   . Dyspnea    with exertion  . Essential hypertension, benign    takes Lisinopril daily  . Insomnia    takes Trazodone nightly as needed  . Mixed hyperlipidemia    takes Pravastatin daily  . Nocturia   . Numbness    both feet  . Restless leg syndrome   . Ruptured disk   . Type 2 diabetes mellitus (HCC)    takes Glyburide,Metformin,Jardiance,and Levemir daily.Average fasting blood sugar 120    Past Surgical History:  Procedure Laterality Date  . AORTIC VALVE REPLACEMENT N/A 01/03/2016   Procedure: AORTIC VALVE REPLACEMENT (AVR);  Surgeon: Gaye Pollack, MD;  Location: [place];  Service: Open Heart Surgery;  Laterality: N/A;  . CARDIAC CATHETERIZATION N/A 12/16/2015   Procedure: Right/Left Heart Cath and Coronary Angiography;  Surgeon: Belva Crome, MD;  Location: [place] [facility];  Service: Cardiovascular;  Laterality: N/A;  . COLONOSCOPY  2007   Dr. Gala Romney: single anal papilla, otherwise unremarkable.   . TEE WITHOUT CARDIOVERSION N/A 01/03/2016   Procedure: TRANSESOPHAGEAL ECHOCARDIOGRAM (TEE);  Surgeon: Gaye Pollack, MD;  Location: [place];  Service: Open Heart Surgery;  Laterality: N/A;  . TOE SURGERY     left foot middle     Prior to Admission medications   Medication Sig Start Date End Date Taking? Authorizing Provider  acetaminophen (TYLENOL) 500 MG tablet Take 1,000 mg by mouth every 6 (six) hours as needed for moderate pain or headache.   Yes [provider]  amLODipine (NORVASC) 10 MG tablet Take 1 tablet (10 mg  total) by mouth daily. 07/03/17  Yes Kathyrn Drown, MD  aspirin 81 MG tablet Take 81 mg by mouth daily.   Yes [provider]  empagliflozin (JARDIANCE) 25 MG TABS tablet Take 25 mg by mouth daily. 01/02/18  Yes Kathyrn Drown, MD  glyBURIDE (DIABETA) 5 MG tablet Take one po Q am and one po with supper Patient taking differently: Take 5 mg by mouth 2 (two) times daily with a meal.  07/03/17  Yes Luking, Elayne Snare, MD  Insulin Detemir (LEVEMIR FLEXTOUCH) 100 UNIT/ML Pen INJECT 24 UNITS INTO THE SKIN AT BEDTIME. Patient taking differently: Inject 24 Units into the skin at bedtime.  07/03/17  Yes Kathyrn Drown, MD  lisinopril (PRINIVIL,ZESTRIL) 40 MG tablet Take 1 tablet (40 mg total) by mouth daily. 07/03/17  Yes Kathyrn Drown, MD  metFORMIN (GLUCOPHAGE) 500 MG tablet Take 1 tablet (500 mg total) by mouth 2 (two) times daily with a meal. 07/03/17  Yes Luking, Scott A, MD  metoprolol tartrate (LOPRESSOR) 50 MG tablet TAKE (1) TABLET BY MOUTH TWICE DAILY. Patient taking differently: Take 50 mg by mouth 2 (two) times daily.  11/01/17  Yes Luking, Scott A, MD  mometasone (ELOCON) 0.1 % cream Apply to affected area bid prn Patient taking differently: Apply 1 application topically 2 (two) times daily as needed (for eczema).  07/03/17 07/03/18 Yes Kathyrn Drown, MD  pravastatin (PRAVACHOL) 20 MG tablet  TAKE 1 TABLET BY MOUTH AT BEDTIME FOR CHOLESTEROL. Patient taking differently: Take 20 mg by mouth at bedtime.  12/27/17  Yes Luking, Elayne Snare, MD  terconazole (TERAZOL 7) 0.4 % vaginal cream Place 1 applicator vaginally at bedtime. Patient taking differently: Place 1 applicator vaginally at bedtime as needed (for yeast infection).  09/10/17  Yes Estill Dooms, NP  warfarin (COUMADIN) 5 MG tablet Take 1 tablet daily except 1/2 tablet on Mondays, Wednesdays and Fridays Patient taking differently: Take 2.5-5 mg by mouth See admin instructions. Take 2.5 mg by mouth daily on Monday, Wednesday and Friday.  Take 5 mg by mouth daily on all other days. 05/01/17  Yes Satira Sark, MD  ACCU-CHEK FASTCLIX LANCETS MISC USE DAILY AS DIRECTED. Patient not taking: Reported on 01/02/2018 09/16/12   Kathyrn Drown, MD  glucose blood (ACCU-CHEK AVIVA PLUS) test strip TEST DAILY AS DIRECTED. Patient not taking: Reported on 01/02/2018 07/03/17   Kathyrn Drown, MD  HYDROcodone-acetaminophen (NORCO/VICODIN) 5-325 MG tablet Take 1 tablet by mouth every 4 (four) hours as needed. Patient taking differently: Take 1 tablet by mouth every 4 (four) hours as needed for moderate pain.  07/03/17   Kathyrn Drown, MD  SURE COMFORT PEN NEEDLES 31G X 5 MM MISC USE AS DIRECTED ONCE DAILY. Patient not taking: Reported on 01/02/2018 12/02/17   Kathyrn Drown, MD    Allergies as of 12/09/2017 - Review Complete 12/09/2017  Allergen Reaction Noted  . No known allergies  01/02/2016    Family History  Problem Relation Age of Onset  . Coronary artery disease Father        Diagnosed in his 15s  . Hypertension Father   . Diabetes type II Mother   . Hypertension Mother   . Diabetes Mother   . Diabetes type II Brother   . Diabetes Brother   . Hypertension Sister   . Hyperlipidemia Sister   . Colon cancer Neg Hx     Social History   Socioeconomic History  . Marital status: Married    Spouse name: Not on file  . Number of children: Not on file  . Years of education: Not on file  . Highest education level: Not on file  Occupational History  . Occupation: Network engineer    Comment: Psychiatrist: [employer]  Social Needs  . Financial resource strain: Not on file  . Food insecurity:    Worry: Not on file    Inability: Not on file  . Transportation needs:    Medical: Not on file    Non-medical: Not on file  Tobacco Use  . Smoking status: Never Smoker  . Smokeless tobacco: Never Used  Substance and Sexual Activity  . Alcohol use: No  . Drug use: No  . Sexual activity: Not Currently    Birth  control/protection: Post-menopausal  Lifestyle  . Physical activity:    Days per week: Not on file    Minutes per session: Not on file  . Stress: Not on file  Relationships  . Social connections:    Talks on phone: Not on file    Gets together: Not on file    Attends religious service: Not on file    Active member of club or organization: Not on file    Attends meetings of clubs or organizations: Not on file    Relationship status: Not on file  . Intimate partner violence:    Fear of current  or ex partner: Not on file    Emotionally abused: Not on file    Physically abused: Not on file    Forced sexual activity: Not on file  Other Topics Concern  . Not on file  Social History Narrative  . Not on file    Review of Systems: See HPI, otherwise negative ROS  Physical Exam: BP (!) 150/61   Pulse 78   Temp (!) 97.5 F (36.4 C) (Oral)   SpO2 98%  General:   Alert,  Well-developed, well-nourished, pleasant and cooperative in NAD Lungs:  Clear throughout to auscultation.   No wheezes, crackles, or rhonchi. No acute distress. Heart:  Regular rate and rhythm; no murmurs, clicks, rubs,  or gallops. Abdomen:  Soft, nontender and nondistended. No masses, hepatosplenomegaly or hernias noted. Normal bowel sounds, without guarding, and without rebound.    Impression/Plan: Tina QUIZHPI is now here to undergo a screening colonoscopy.  Average risk screening examination.  Risks, benefits, limitations, imponderables and alternatives regarding colonoscopy have been reviewed with the patient. Questions have been answered. All parties agreeable.     Notice:  This dictation was prepared with Dragon dictation along with smaller phrase technology. Any transcriptional errors that result from this process are unintentional and may not be corrected upon review.

## 2018-01-09 NOTE — Op Note (Signed)
[facility] Patient Name: [name] Procedure Date: 01/09/2018 7:55 AM MRN: [id number] Date of Birth: [birth year] Attending MD: Norvel Richards , MD CSN: [id number] Age: 62 Admit Type: Outpatient Procedure:                Colonoscopy Indications:              Screening for colorectal malignant neoplasm Providers:                Norvel Richards, MD, Rosina Lowenstein, RN, Aram Candela Referring MD:              Medicines:                Propofol per Anesthesia Complications:            No immediate complications. Estimated Blood Loss:     Estimated blood loss was minimal. Procedure:                Pre-Anesthesia Assessment:                           - Prior to the procedure, a History and Physical                            was performed, and patient medications and                            allergies were reviewed. The patient's tolerance of                            previous anesthesia was also reviewed. The risks                            and benefits of the procedure and the sedation                            options and risks were discussed with the patient.                            All questions were answered, and informed consent                            was obtained. Prior Anticoagulants: The patient                            last took Coumadin (warfarin) 4 days prior to the                            procedure. ASA Grade Assessment: II - A patient                            with mild systemic disease. After reviewing the  risks and benefits, the patient was deemed in                            satisfactory condition to undergo the procedure.                           After obtaining informed consent, the colonoscope                            was passed under direct vision. Throughout the                            procedure, the patient's blood pressure, pulse, and                            oxygen  saturations were monitored continuously. The                            CF-[id number] ([id number]) scope was introduced through                            the and advanced to the the cecum, identified by                            appendiceal orifice and ileocecal valve. The                            colonoscopy was performed without difficulty. The                            patient tolerated the procedure well. The quality                            of the bowel preparation was adequate. Scope In: 8:20:34 AM Scope Out: 8:32:04 AM Scope Withdrawal Time: 0 hours 8 minutes 50 seconds  Total Procedure Duration: 0 hours 11 minutes 30 seconds  Findings:      The perianal and digital rectal examinations were normal.      Internal hemorrhoids were found during perianal exam. The hemorrhoids       were moderate, medium-sized and Grade I (internal hemorrhoids that do       not prolapse).      A 4 mm polyp was found in the rectum. The polyp was sessile. The polyp       was removed with a cold snare. Resection and retrieval were complete.       Estimated blood loss was minimal.      The exam was otherwise without abnormality on direct and retroflexion       views. Impression:               - Internal hemorrhoids.                           - One 4 mm polyp in the rectum, removed with a cold  snare. Resected and retrieved.                           - The examination was otherwise normal on direct                            and retroflexion views. Moderate Sedation:      Moderate (conscious) sedation was personally administered by an       anesthesia professional. The following parameters were monitored: oxygen       saturation, heart rate, blood pressure, respiratory rate, EKG, adequacy       of pulmonary ventilation, and response to care. Recommendation:           - Patient has a contact number available for                            emergencies. The signs and symptoms of  potential                            delayed complications were discussed with the                            patient. Return to normal activities tomorrow.                            Written discharge instructions were provided to the                            patient.                           - Resume previous diet.                           - Continue present medications. Resume coumadin                            today                           - Await pathology results.                           - Repeat colonoscopy date to be determined after                            pending pathology results are reviewed for                            surveillance based on pathology results.                           - Return to GI office (date not yet determined). Procedure Code(s):        --- Professional ---                           [phone],  Colonoscopy, flexible; with removal of                            tumor(s), polyp(s), or other lesion(s) by snare                            technique Diagnosis Code(s):        --- Professional ---                           Z12.11, Encounter for screening for malignant                            neoplasm of colon                           K62.1, Rectal polyp                           K64.0, First degree hemorrhoids CPT copyright 2018 American Medical Association. All rights reserved. The codes documented in this report are preliminary and upon coder review may  be revised to meet current compliance requirements. Cristopher Estimable. Kymorah Korf, MD Norvel Richards, MD 01/09/2018 8:43:07 AM This report has been signed electronically. Number of Addenda: 0

## 2018-01-09 NOTE — Discharge Instructions (Signed)
°  Colonoscopy Discharge Instructions  Read the instructions outlined below and refer to this sheet in the next few weeks. These discharge instructions provide you with general information on caring for yourself after you leave the hospital. Your doctor may also give you specific instructions. While your treatment has been planned according to the most current medical practices available, unavoidable complications occasionally occur. If you have any problems or questions after discharge, call Dr. Gala Romney at [phone]. ACTIVITY  You may resume your regular activity, but move at a slower pace for the next 24 hours.   Take frequent rest periods for the next 24 hours.   Walking will help get rid of the air and reduce the bloated feeling in your belly (abdomen).   No driving for 24 hours (because of the medicine (anesthesia) used during the test).    Do not sign any important legal documents or operate any machinery for 24 hours (because of the anesthesia used during the test).  NUTRITION  Drink plenty of fluids.   You may resume your normal diet as instructed by your doctor.   Begin with a light meal and progress to your normal diet. Heavy or fried foods are harder to digest and may make you feel sick to your stomach (nauseated).   Avoid alcoholic beverages for 24 hours or as instructed.  MEDICATIONS  You may resume your normal medications unless your doctor tells you otherwise.  WHAT YOU CAN EXPECT TODAY  Some feelings of bloating in the abdomen.   Passage of more gas than usual.   Spotting of blood in your stool or on the toilet paper.  IF YOU HAD POLYPS REMOVED DURING THE COLONOSCOPY:  No aspirin products for 7 days or as instructed.   No alcohol for 7 days or as instructed.   Eat a soft diet for the next 24 hours.  FINDING OUT THE RESULTS OF YOUR TEST Not all test results are available during your visit. If your test results are not back during the visit, make an appointment  with your caregiver to find out the results. Do not assume everything is normal if you have not heard from your caregiver or the medical facility. It is important for you to follow up on all of your test results.  SEEK IMMEDIATE MEDICAL ATTENTION IF:  You have more than a spotting of blood in your stool.   Your belly is swollen (abdominal distention).   You are nauseated or vomiting.   You have a temperature over 101.   You have abdominal pain or discomfort that is severe or gets worse throughout the day.   Colon polyp information provided  Further recommendations to follow pending review of pathology report  Resume Coumadin today

## 2018-01-10 ENCOUNTER — Other Ambulatory Visit: Payer: Self-pay | Admitting: Family Medicine

## 2018-01-10 DIAGNOSIS — E119 Type 2 diabetes mellitus without complications: Secondary | ICD-10-CM

## 2018-01-10 DIAGNOSIS — I10 Essential (primary) hypertension: Secondary | ICD-10-CM

## 2018-01-13 ENCOUNTER — Encounter: Payer: Self-pay | Admitting: Internal Medicine

## 2018-01-15 ENCOUNTER — Encounter (HOSPITAL_COMMUNITY): Payer: Self-pay | Admitting: Internal Medicine

## 2018-01-19 IMAGING — US US THYROID
1 series · 13 of 25 positions shown · non-contrast
Comparison: 10/21/2015;

CLINICAL DATA: Prior ultrasound follow-up. Follow-up thyroid
nodule. History of right-sided thyroid nodule biopsy ([DATE]).

EXAM:
THYROID ULTRASOUND
TECHNIQUE: Ultrasound examination of the thyroid gland and adjacent soft
tissues was performed.

[Series 1: us thyroid · 0.07mm/px · 13 of 61 slices shown]
[im 1/61]
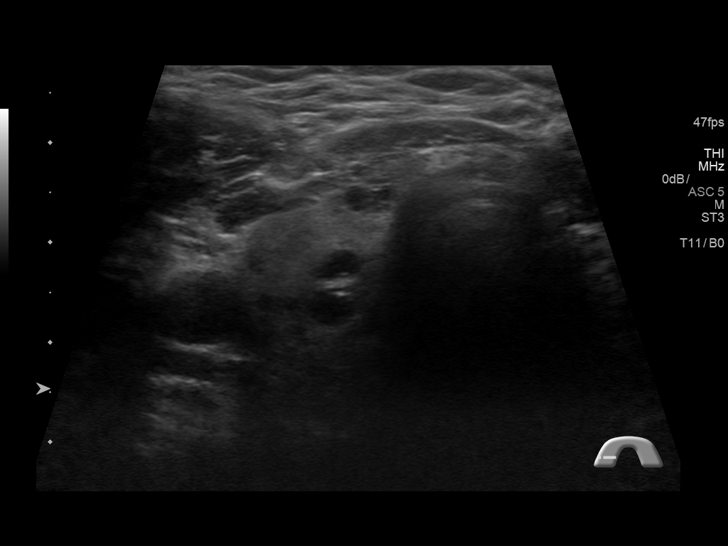
[im 6/61]
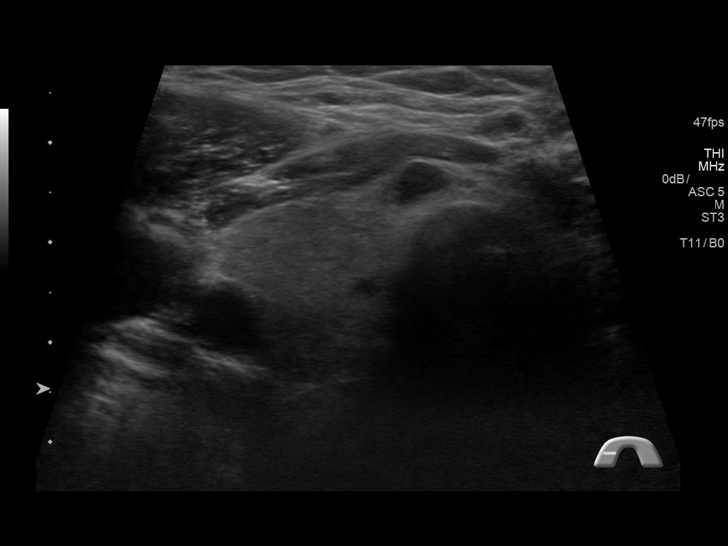
[im 11/61]
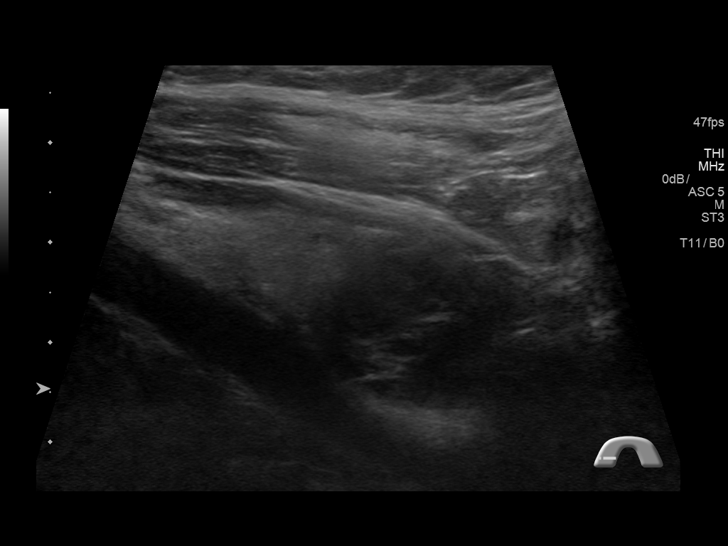
[im 16/61]
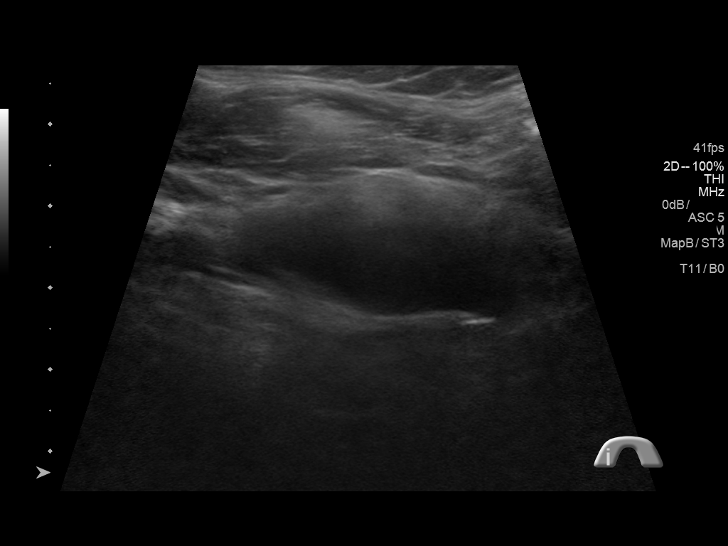
[im 21/61]
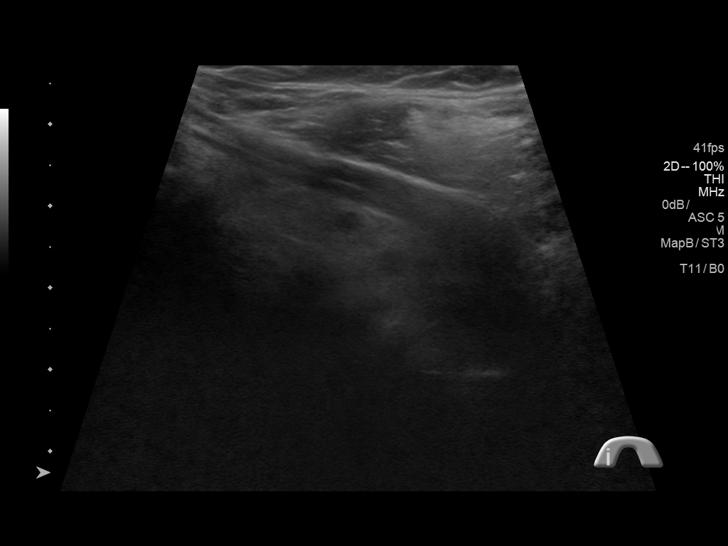
[im 26/61]
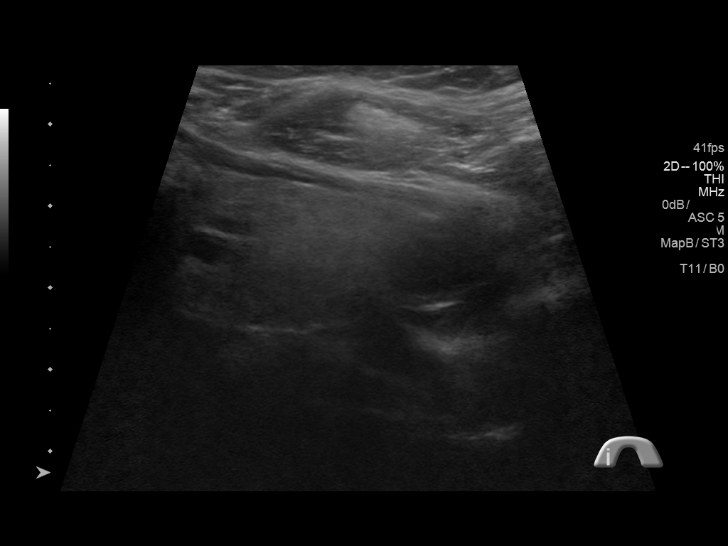
[im 31/61]
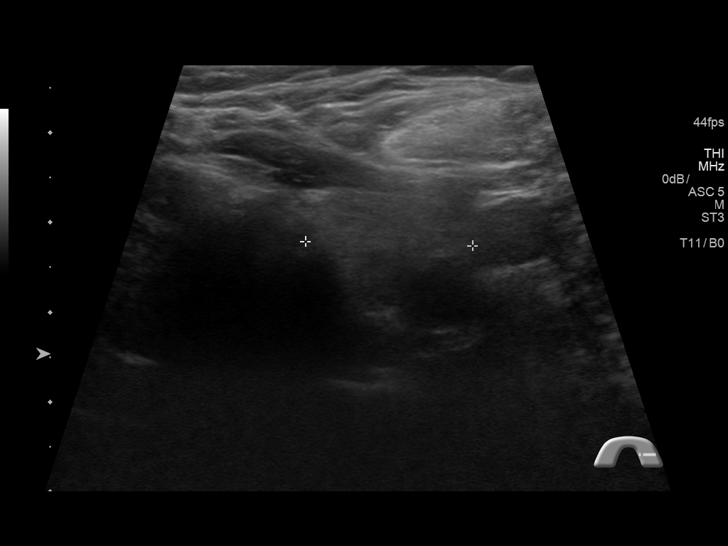
[im 36/61]
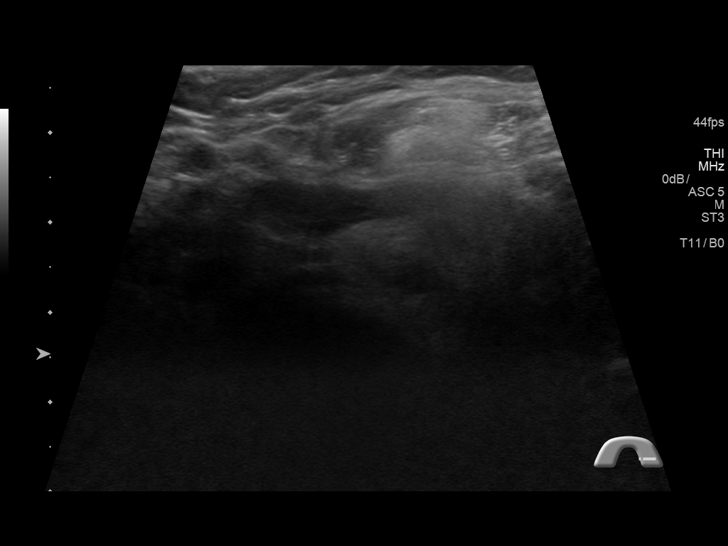
[im 41/61]
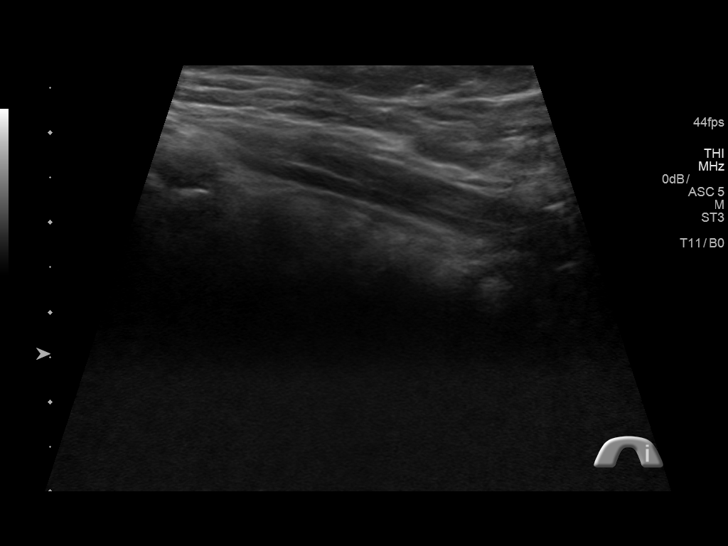
[im 46/61]
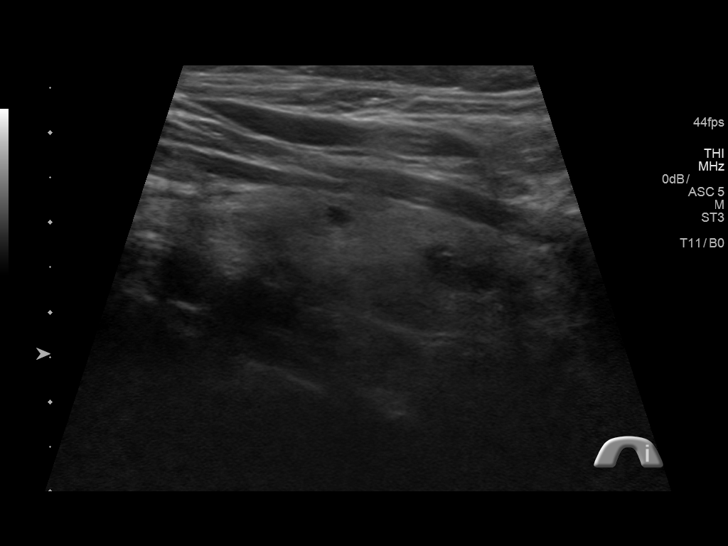
[im 51/61]
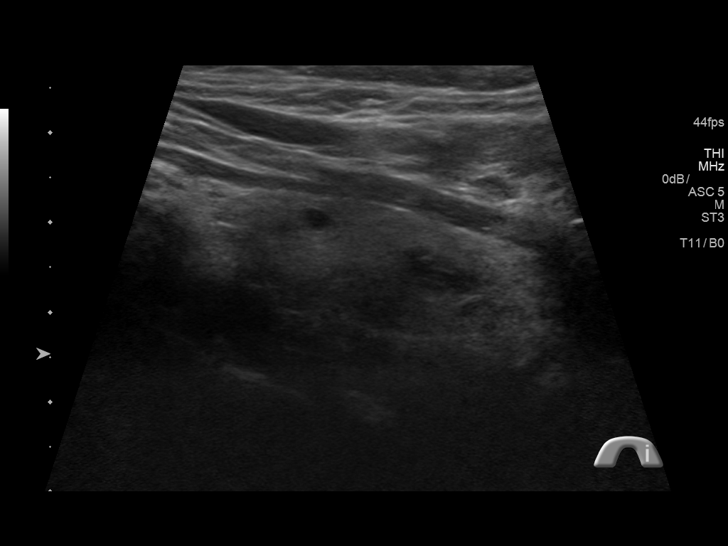
[im 56/61]
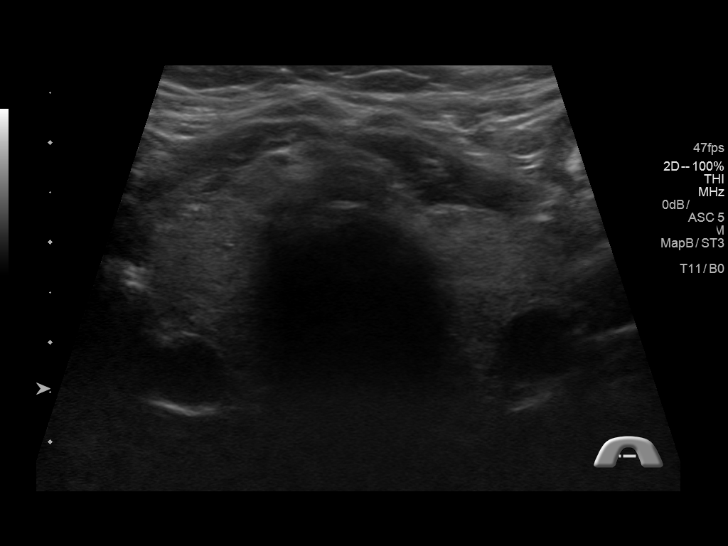
[im 61/61]
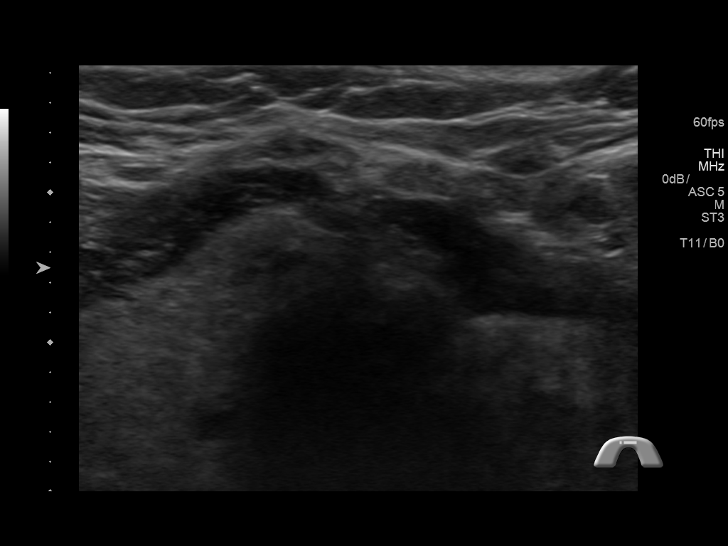

[13 of 25 positions shown; findings below may reference images not displayed]

09/29/2012; ultrasound-guided right-sided
thyroid nodule fine-needle aspiration -10/17/2012
FINDINGS: Parenchymal Echotexture: Mildly heterogenous

Isthmus: Borderline enlarged measures 0.7 cm in diameter, unchanged

Right lobe: Normal in size measuring 5.4 x 2.2 x 2.0 cm, unchanged,
previously, 5.2 x 1.8 x 1.7 cm

Left lobe: Normal in size measuring 4.4 x 1.9 x 1.9 cm, unchanged,
previously, 4.8 x 1.8 x 1.4 cm

_________________________________________________________

Estimated total number of nodules >/= 1 cm: 1

Number of spongiform nodules >/=  2 cm not described below (TR1): 0

Number of mixed cystic and solid nodules >/= 1.5 cm not described
below (TR2): 0

_________________________________________________________

The previously biopsied approximately 2.3 x 1.8 x 1.5 cm partially
cystic, partially solid nodule within the inferior pole the right
lobe of the thyroid appears morphologically similar to the [DATE]
examination, previously, 2.3 x 1.4 x 1.5 cm with slight differences
likely total to scan plane projection. Correlation with prior biopsy
results is recommended.

There are several additional punctate (sub 0.9 cm) anechoic cystic
nodules scattered throughout the remainder of both the right and
left lobes of the thyroid, all of which appear morphologically
similar to the [DATE] examination.
IMPRESSION: 1. Similar findings of multinodular goiter. No new or enlarging
thyroid nodules.
2. The previously biopsied approximately 2.3 cm partially cystic,
partially solid nodule within the right lobe of the thyroid is
unchanged compared to the [DATE] examination. Correlation with
prior biopsy results is recommended. Assuming a benign pathologic
diagnosis, this nodule does not meet imaging criteria to recommend
repeat percutaneous sampling or continued dedicated follow-up.
3. Punctate bilateral thyroid cysts appear similar to the [DATE]
examination and do not meet imaging criteria to recommend
percutaneous sampling or continued dedicated follow-up.
The above is in keeping with the ACR TI-RADS recommendations - [HOSPITAL] 6089;[DATE].

## 2018-01-27 ENCOUNTER — Telehealth: Payer: Self-pay | Admitting: Family Medicine

## 2018-01-27 ENCOUNTER — Ambulatory Visit (INDEPENDENT_AMBULATORY_CARE_PROVIDER_SITE_OTHER): Payer: BC Managed Care – PPO | Admitting: *Deleted

## 2018-01-27 DIAGNOSIS — Z952 Presence of prosthetic heart valve: Secondary | ICD-10-CM | POA: Diagnosis not present

## 2018-01-27 DIAGNOSIS — Z5181 Encounter for therapeutic drug level monitoring: Secondary | ICD-10-CM

## 2018-01-27 LAB — POCT INR: INR: 2.5 (ref 2.0–3.0)

## 2018-01-27 NOTE — Patient Instructions (Signed)
Continue coumadin 1 tablet daily except 1/2 tablet on Mondays, Wednesdays and Fridays Recheck in 4 weeks 

## 2018-01-27 NOTE — Telephone Encounter (Signed)
Pt dropped off blood sugar readings. Results placed in box in Dr. Lars Mage office.

## 2018-01-28 ENCOUNTER — Other Ambulatory Visit: Payer: Self-pay | Admitting: Family Medicine

## 2018-01-28 DIAGNOSIS — Z1231 Encounter for screening mammogram for malignant neoplasm of breast: Secondary | ICD-10-CM

## 2018-01-28 LAB — BASIC METABOLIC PANEL
BUN/Creatinine Ratio: 19 (ref 12–28)
BUN: 29 mg/dL — ABNORMAL HIGH (ref 8–27)
CO2: 21 mmol/L (ref 20–29)
Calcium: 9.7 mg/dL (ref 8.7–10.3)
Chloride: 94 mmol/L — ABNORMAL LOW (ref 96–106)
Creatinine, Ser: 1.54 mg/dL — ABNORMAL HIGH (ref 0.57–1.00)
GFR calc non Af Amer: 35 mL/min/{1.73_m2} — ABNORMAL LOW (ref >59)
GFR, EST AFRICAN AMERICAN: 41 mL/min/{1.73_m2} — AB (ref >59)
Glucose: 253 mg/dL — ABNORMAL HIGH (ref 65–99)
POTASSIUM: 4.8 mmol/L (ref 3.5–5.2)
SODIUM: 134 mmol/L (ref 134–144)

## 2018-01-28 LAB — HEMOGLOBIN A1C
Est. average glucose Bld gHb Est-mCnc: 255 mg/dL
Hgb A1c MFr Bld: 10.5 % — ABNORMAL HIGH (ref 4.8–5.6)

## 2018-01-29 ENCOUNTER — Other Ambulatory Visit: Payer: Self-pay | Admitting: Family Medicine

## 2018-01-29 MED ORDER — GLYBURIDE 5 MG PO TABS: ORAL_TABLET | ORAL | 5 refills | Status: DC

## 2018-01-29 NOTE — Telephone Encounter (Signed)
Glucose readings were reviewed morning glucoses over the past 3 weeks range from a high of 252 to a low of 102 and most recent 130  Glucose readings before dinner early in November was 293 mid January 05, 2020 then 177 with a bedtime at 264  Based on this she is taking glipizide 5 mg tablet in the morning and at 5 mg at supper I would like for her to do 1-1/2 tablets in the morning and continue to take 1 at supper Keep track of her readings between now and her office visit in December If having any low sugars to please let us know. Keep everything else as its

## 2018-01-29 NOTE — Telephone Encounter (Signed)
Left message to return call 

## 2018-01-29 NOTE — Telephone Encounter (Signed)
Patient advise per Dr Nicki Reaper: Based on her readings she is taking glipizide 5 mg tablet in the morning and at 5 mg at supper Dr Nicki Reaper would like for her to do 1-1/2 tablets in the morning and continue to take 1 at supper Keep track of her readings between now and her office visit in December If having any low sugars to please let us know. Keep everything else as its Patient verbalized understanding.

## 2018-02-10 ENCOUNTER — Encounter: Payer: Self-pay | Admitting: Family Medicine

## 2018-02-10 ENCOUNTER — Ambulatory Visit: Payer: BC Managed Care – PPO | Admitting: Family Medicine

## 2018-02-10 VITALS — BP 112/60 | Ht 66.0 in | Wt 240.0 lb

## 2018-02-10 DIAGNOSIS — I10 Essential (primary) hypertension: Secondary | ICD-10-CM | POA: Diagnosis not present

## 2018-02-10 DIAGNOSIS — E119 Type 2 diabetes mellitus without complications: Secondary | ICD-10-CM

## 2018-02-10 DIAGNOSIS — N289 Disorder of kidney and ureter, unspecified: Secondary | ICD-10-CM

## 2018-02-10 DIAGNOSIS — E7849 Other hyperlipidemia: Secondary | ICD-10-CM | POA: Diagnosis not present

## 2018-02-10 MED ORDER — LISINOPRIL 40 MG PO TABS: 40.0000 mg | ORAL_TABLET | Freq: Every day | ORAL | 5 refills | Status: DC

## 2018-02-10 MED ORDER — METOPROLOL TARTRATE 50 MG PO TABS: 50.0000 mg | ORAL_TABLET | Freq: Two times a day (BID) | ORAL | 1 refills | Status: DC

## 2018-02-10 MED ORDER — PRAVASTATIN SODIUM 20 MG PO TABS: ORAL_TABLET | ORAL | 5 refills | Status: DC

## 2018-02-10 MED ORDER — HYDROCODONE-ACETAMINOPHEN 5-325 MG PO TABS: 1.0000 | ORAL_TABLET | ORAL | 0 refills | Status: DC | PRN

## 2018-02-10 MED ORDER — EMPAGLIFLOZIN 25 MG PO TABS: 25.0000 mg | ORAL_TABLET | Freq: Every day | ORAL | 5 refills | Status: DC

## 2018-02-10 MED ORDER — INSULIN DETEMIR 100 UNIT/ML FLEXPEN: PEN_INJECTOR | SUBCUTANEOUS | 5 refills | Status: DC

## 2018-02-10 MED ORDER — GLYBURIDE 5 MG PO TABS: ORAL_TABLET | ORAL | 5 refills | Status: DC

## 2018-02-10 NOTE — Progress Notes (Signed)
Subjective:    Patient ID: [name], female    DOB: November 01, 1955, 62 y.o.   MRN: [id number]  HPI  Patient is here today to follow up on her chronic illnesses. She is diabetic and her last a1c was done 01/27/2018 at 10.5. She takes glyburide 5 mg one and half in am and one Qhs,Levemir 30 u daily,Jardiance 25 mg one po Qd,Metformin 500 mg one bid.She also gets Hydrocodone 5-325 maybe once per month.She says she does not eat as she should. She gets very little exercise.She does see a Cardiologist,eye Dr,but no podiatrist.   Patient for blood pressure check up.  The patient does have hypertension.  The patient is on medication.  Patient relates compliance with meds. Todays BP reviewed with the patient. Patient denies issues with medication. Patient relates reasonable diet. Patient tries to minimize salt. Patient aware of BP goals.   Patient here for follow-up regarding cholesterol.  The patient does have hyperlipidemia.  Patient does try to maintain a reasonable diet.  Patient does take the medication on a regular basis.  Denies missing a dose.  The patient denies any obvious side effects.  Prior blood work results reviewed with the patient.  The patient is aware of his cholesterol goals and the need to keep it under good control to lessen the risk of disease.  The patient was seen today as part of a comprehensive diabetic check up.the patient does have diabetes.  The patient follows here on a regular basis.  The patient relates medication compliance. No significant side effects to the medications. Denies any low glucose spells. Relates compliance with diet to a reasonable level. Patient does do labwork intermittently and understands the dangers of diabetes.   Recent lab work was completed A1c mild elevation Creatinine has gone up Review of Systems  Constitutional: Negative for activity change, appetite change and fatigue.  HENT: Negative for congestion and rhinorrhea.   Respiratory:  Negative for cough and shortness of breath.   Cardiovascular: Negative for chest pain and leg swelling.  Gastrointestinal: Negative for abdominal pain and diarrhea.  Endocrine: Negative for polydipsia and polyphagia.  Skin: Negative for color change.  Neurological: Negative for dizziness and weakness.  Psychiatric/Behavioral: Negative for behavioral problems and confusion.       Objective:   Physical Exam  Constitutional: She appears well-nourished. No distress.  HENT:  Head: Normocephalic and atraumatic.  Eyes: Right eye exhibits no discharge. Left eye exhibits no discharge.  Neck: No tracheal deviation present.  Cardiovascular: Normal rate, regular rhythm and normal heart sounds.  No murmur heard. Pulmonary/Chest: Effort normal and breath sounds normal. No respiratory distress.  Musculoskeletal: She exhibits no edema.  Lymphadenopathy:    She has no cervical adenopathy.  Neurological: She is alert. Coordination normal.  Skin: Skin is warm and dry.  Psychiatric: She has a normal mood and affect. Her behavior is normal.  Vitals reviewed.         Assessment & Plan:  Renal insufficiency Chronic kidney disease associated with diabetes Stop metformin Recheck metabolic 7 in approximately 2 weeks if not seeing improvement will stop Jardiance Continue lisinopril for now for renal protective benefits  Blood pressure good control watch diet stay active  Diabetes subpar control very important to eat healthier diet staying active and repeat A1c again in several months may need daytime insulin if numbers do not look good that she sends back in several weeks  The patient was seen today as part of an evaluation regarding  hyperlipidemia.  Recent lab work has been reviewed with the patient as well as the goals for good cholesterol care.  In addition to this medications have been discussed the importance of compliance with diet and medications discussed as well.  Finally the patient is  aware that poor control of cholesterol, noncompliance can dramatically increase the risk of complications. The patient will keep regular office visits and the patient does agreed to periodic lab work.

## 2018-02-14 ENCOUNTER — Telehealth: Payer: Self-pay | Admitting: Family Medicine

## 2018-02-14 NOTE — Telephone Encounter (Signed)
Nurses Please connect with the patient Please let the patient know that I looked into her overall condition and recent lab work in greater detail and research I am requesting that she stop her Jardiance Please have her follow her sugars multiple times per week some in the morning some later in the day keep those on a written log and please try to turn those into [place] for my review by 23 December Her sugars will probably go up some But with her kidney functions where they are at the [place] could actually contribute greater to the kidney issue and therefore it would be in her best interest to stop this medicine  Please note this on her medical epic medicine list by stopping this medicine thank you Document accordingly

## 2018-02-14 NOTE — Addendum Note (Signed)
Addended by: Karle Barr on: 02/14/2018 10:43 AM   Modules accepted: Orders

## 2018-02-14 NOTE — Telephone Encounter (Signed)
Patient is aware of all and will check bs and have those turned in before Dec 23 for your review.D/c'd on Epic.

## 2018-02-14 NOTE — Telephone Encounter (Signed)
I called and left a message asked that pt r/c.

## 2018-02-24 ENCOUNTER — Telehealth: Payer: Self-pay | Admitting: Family Medicine

## 2018-02-24 ENCOUNTER — Ambulatory Visit (HOSPITAL_COMMUNITY)
Admission: RE | Admit: 2018-02-24 | Discharge: 2018-02-24 | Disposition: A | Discharge status: Home / Self Care | Payer: BC Managed Care – PPO | Source: Ambulatory Visit | Attending: Family Medicine | Admitting: Family Medicine

## 2018-02-24 ENCOUNTER — Ambulatory Visit (INDEPENDENT_AMBULATORY_CARE_PROVIDER_SITE_OTHER): Payer: BC Managed Care – PPO | Admitting: *Deleted

## 2018-02-24 DIAGNOSIS — Z5181 Encounter for therapeutic drug level monitoring: Secondary | ICD-10-CM | POA: Diagnosis not present

## 2018-02-24 DIAGNOSIS — Z1231 Encounter for screening mammogram for malignant neoplasm of breast: Secondary | ICD-10-CM | POA: Insufficient documentation

## 2018-02-24 DIAGNOSIS — Z952 Presence of prosthetic heart valve: Secondary | ICD-10-CM | POA: Diagnosis not present

## 2018-02-24 LAB — POCT INR: INR: 2.6 (ref 2.0–3.0)

## 2018-02-24 NOTE — Patient Instructions (Signed)
Continue coumadin 1 tablet daily except 1/2 tablet on Mondays, Wednesdays and Fridays Recheck in 6 weeks 

## 2018-02-24 NOTE — Telephone Encounter (Signed)
Pt dropped off blood sugar readings. Placed results in Dr. Bary Leriche basket in office.

## 2018-02-25 LAB — BASIC METABOLIC PANEL
BUN/Creatinine Ratio: 15 (ref 12–28)
BUN: 16 mg/dL (ref 8–27)
CALCIUM: 9.3 mg/dL (ref 8.7–10.3)
CO2: 22 mmol/L (ref 20–29)
Chloride: 102 mmol/L (ref 96–106)
Creatinine, Ser: 1.06 mg/dL — ABNORMAL HIGH (ref 0.57–1.00)
GFR calc Af Amer: 64 mL/min/{1.73_m2} (ref >59)
GFR, EST NON AFRICAN AMERICAN: 56 mL/min/{1.73_m2} — AB (ref >59)
GLUCOSE: 256 mg/dL — AB (ref 65–99)
POTASSIUM: 5 mmol/L (ref 3.5–5.2)
Sodium: 138 mmol/L (ref 134–144)

## 2018-03-01 ENCOUNTER — Other Ambulatory Visit: Payer: Self-pay | Admitting: Family Medicine

## 2018-03-02 NOTE — Telephone Encounter (Signed)
Nurses Please let the patient know I reviewed over her glucose log I recommend glimepiride increase it to 2 tablets each morning and keep taking 1 at suppertime Also recommend increasing her long-acting insulin to 34 units Send [place] some random readings again in approximately 2 weeks  Please also make these changes in her medication list to reflect these recommendations thank you

## 2018-03-03 MED ORDER — INSULIN DETEMIR 100 UNIT/ML FLEXPEN: PEN_INJECTOR | SUBCUTANEOUS | 5 refills | Status: DC

## 2018-03-03 MED ORDER — GLYBURIDE 5 MG PO TABS: ORAL_TABLET | ORAL | 5 refills | Status: DC

## 2018-03-03 NOTE — Telephone Encounter (Signed)
Patient is aware of all. 

## 2018-03-07 ENCOUNTER — Other Ambulatory Visit: Payer: Self-pay

## 2018-03-07 ENCOUNTER — Telehealth: Payer: Self-pay

## 2018-03-07 MED ORDER — DOXYCYCLINE HYCLATE 100 MG PO TABS: 100.0000 mg | ORAL_TABLET | Freq: Two times a day (BID) | ORAL | 0 refills | Status: AC

## 2018-03-07 NOTE — Telephone Encounter (Signed)
Arm infection and doxycycline 100 mg one bid for ten days sent to [facility].
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
## 2018-03-12 ENCOUNTER — Other Ambulatory Visit: Payer: Self-pay | Admitting: Family Medicine

## 2018-03-19 ENCOUNTER — Other Ambulatory Visit: Payer: Self-pay | Admitting: Family Medicine

## 2018-03-19 MED ORDER — ONETOUCH DELICA LANCETS 33G MISC: 1 refills | Status: DC

## 2018-03-19 MED ORDER — INSULIN PEN NEEDLE 31G X 5 MM MISC: 1 refills | Status: DC

## 2018-04-02 ENCOUNTER — Telehealth: Payer: Self-pay | Admitting: *Deleted

## 2018-04-02 NOTE — Telephone Encounter (Signed)
Patient states that Dr [place] Crane Dermatologist recommends a chest x ray due to a skin cancer found on her arm. Patient has order for CXR from Dr [place] Crane and was advised to take order to hospital to have X ray done as advised. Patient verbalized understanding.

## 2018-04-09 ENCOUNTER — Ambulatory Visit (INDEPENDENT_AMBULATORY_CARE_PROVIDER_SITE_OTHER): Payer: BC Managed Care – PPO | Admitting: Pharmacist

## 2018-04-09 DIAGNOSIS — Z5181 Encounter for therapeutic drug level monitoring: Secondary | ICD-10-CM | POA: Diagnosis not present

## 2018-04-09 DIAGNOSIS — Z952 Presence of prosthetic heart valve: Secondary | ICD-10-CM

## 2018-04-09 LAB — POCT INR: INR: 2.8 (ref 2.0–3.0)

## 2018-04-09 NOTE — Patient Instructions (Signed)
Description   Continue coumadin 1 tablet daily except 1/2 tablet on Mondays, Wednesdays and Fridays Recheck in 6 weeks

## 2018-04-11 ENCOUNTER — Ambulatory Visit (HOSPITAL_COMMUNITY)
Admission: RE | Admit: 2018-04-11 | Discharge: 2018-04-11 | Disposition: A | Discharge status: Home / Self Care | Payer: BC Managed Care – PPO | Source: Ambulatory Visit | Attending: Internal Medicine | Admitting: Internal Medicine

## 2018-04-11 ENCOUNTER — Other Ambulatory Visit (HOSPITAL_COMMUNITY): Payer: Self-pay | Admitting: Internal Medicine

## 2018-04-11 DIAGNOSIS — C44629 Squamous cell carcinoma of skin of left upper limb, including shoulder: Secondary | ICD-10-CM | POA: Insufficient documentation

## 2018-04-30 ENCOUNTER — Other Ambulatory Visit: Payer: Self-pay | Admitting: Family Medicine

## 2018-05-13 ENCOUNTER — Encounter: Payer: Self-pay | Admitting: Family Medicine

## 2018-05-13 NOTE — Telephone Encounter (Signed)
If it is a small burn I would recommend Bactroban ointment apply thin amount to the burn once daily over the course of the next 7 days until it heals, 22 g tube with 1 refill  If it is a large burn I would recommend Silvadene cream 50 g tub with same directions

## 2018-05-14 ENCOUNTER — Ambulatory Visit: Payer: BC Managed Care – PPO | Admitting: Family Medicine

## 2018-05-14 ENCOUNTER — Encounter: Payer: Self-pay | Admitting: Family Medicine

## 2018-05-14 ENCOUNTER — Other Ambulatory Visit: Payer: Self-pay

## 2018-05-14 VITALS — Temp 97.4°F | Wt 252.4 lb

## 2018-05-14 DIAGNOSIS — E782 Mixed hyperlipidemia: Secondary | ICD-10-CM

## 2018-05-14 DIAGNOSIS — E119 Type 2 diabetes mellitus without complications: Secondary | ICD-10-CM

## 2018-05-14 DIAGNOSIS — T24202A Burn of second degree of unspecified site of left lower limb, except ankle and foot, initial encounter: Secondary | ICD-10-CM

## 2018-05-14 DIAGNOSIS — I10 Essential (primary) hypertension: Secondary | ICD-10-CM

## 2018-05-14 DIAGNOSIS — L03115 Cellulitis of right lower limb: Secondary | ICD-10-CM

## 2018-05-14 MED ORDER — CEPHALEXIN 500 MG PO CAPS: 500.0000 mg | ORAL_CAPSULE | Freq: Four times a day (QID) | ORAL | 0 refills | Status: DC

## 2018-05-14 MED ORDER — MUPIROCIN 2 % EX OINT: TOPICAL_OINTMENT | CUTANEOUS | 0 refills | Status: AC
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
## 2018-05-21 ENCOUNTER — Ambulatory Visit (INDEPENDENT_AMBULATORY_CARE_PROVIDER_SITE_OTHER): Payer: BC Managed Care – PPO | Admitting: *Deleted

## 2018-05-21 ENCOUNTER — Other Ambulatory Visit: Payer: Self-pay

## 2018-05-21 DIAGNOSIS — Z5181 Encounter for therapeutic drug level monitoring: Secondary | ICD-10-CM | POA: Diagnosis not present

## 2018-05-21 DIAGNOSIS — Z952 Presence of prosthetic heart valve: Secondary | ICD-10-CM

## 2018-05-21 LAB — POCT INR: INR: 3.1 — AB (ref 2.0–3.0)

## 2018-05-21 NOTE — Patient Instructions (Signed)
Continue coumadin 1 tablet daily except 1/2 tablet on Mondays, Wednesdays and Fridays Recheck in 7 weeks due to COVID 19

## 2018-05-22 LAB — LIPID PANEL
Chol/HDL Ratio: 2.4 ratio (ref 0.0–4.4)
Cholesterol, Total: 109 mg/dL (ref 100–199)
HDL: 45 mg/dL (ref >39)
LDL Calculated: 37 mg/dL (ref 0–99)
Triglycerides: 135 mg/dL (ref 0–149)
VLDL Cholesterol Cal: 27 mg/dL (ref 5–40)

## 2018-05-22 LAB — HEPATIC FUNCTION PANEL
ALT: 11 IU/L (ref 0–32)
AST: 14 IU/L (ref 0–40)
Albumin: 4.2 g/dL (ref 3.8–4.8)
Alkaline Phosphatase: 92 IU/L (ref 39–117)
BILIRUBIN, DIRECT: 0.14 mg/dL (ref 0.00–0.40)
Bilirubin Total: 0.4 mg/dL (ref 0.0–1.2)
Total Protein: 6.7 g/dL (ref 6.0–8.5)

## 2018-05-22 LAB — BASIC METABOLIC PANEL
BUN / CREAT RATIO: 13 (ref 12–28)
BUN: 12 mg/dL (ref 8–27)
CO2: 25 mmol/L (ref 20–29)
Calcium: 9.6 mg/dL (ref 8.7–10.3)
Chloride: 100 mmol/L (ref 96–106)
Creatinine, Ser: 0.96 mg/dL (ref 0.57–1.00)
GFR calc Af Amer: 72 mL/min/{1.73_m2} (ref >59)
GFR calc non Af Amer: 63 mL/min/{1.73_m2} (ref >59)
GLUCOSE: 219 mg/dL — AB (ref 65–99)
Potassium: 5.1 mmol/L (ref 3.5–5.2)
Sodium: 138 mmol/L (ref 134–144)

## 2018-05-22 LAB — HEMOGLOBIN A1C
Est. average glucose Bld gHb Est-mCnc: 235 mg/dL
Hgb A1c MFr Bld: 9.8 % — ABNORMAL HIGH (ref 4.8–5.6)

## 2018-05-28 ENCOUNTER — Ambulatory Visit: Payer: BC Managed Care – PPO | Admitting: Family Medicine

## 2018-05-29 ENCOUNTER — Telehealth: Payer: Self-pay | Admitting: Family Medicine

## 2018-05-29 ENCOUNTER — Encounter: Payer: Self-pay | Admitting: Family Medicine

## 2018-05-29 ENCOUNTER — Other Ambulatory Visit: Payer: Self-pay | Admitting: Family Medicine

## 2018-05-29 MED ORDER — DOXYCYCLINE HYCLATE 100 MG PO TABS: ORAL_TABLET | ORAL | 0 refills | Status: DC

## 2018-05-29 NOTE — Telephone Encounter (Signed)
This patient has a early morning appointment in April 9  It is at 8 AM which will need to be removed later in the morning I recommend this as a tele-visit April 9  We will be able to see how she is doing with her diabetes and discuss her lab work further   Please reschedule accordingly

## 2018-05-29 NOTE — Telephone Encounter (Signed)
I would recommend doxycycline 100 mg 1 twice daily for next 7 days take with a snack in a tall glass of water If any ongoing symptoms to notify us we can always do a tele-visit

## 2018-05-30 NOTE — Telephone Encounter (Signed)
[facility] - need to change to a televisit

## 2018-06-12 ENCOUNTER — Ambulatory Visit: Payer: BC Managed Care – PPO | Admitting: Family Medicine

## 2018-06-30 ENCOUNTER — Other Ambulatory Visit: Payer: Self-pay | Admitting: Family Medicine

## 2018-07-08 ENCOUNTER — Telehealth: Payer: Self-pay | Admitting: *Deleted

## 2018-07-08 NOTE — Telephone Encounter (Signed)

## 2018-07-10 ENCOUNTER — Other Ambulatory Visit: Payer: Self-pay

## 2018-07-10 ENCOUNTER — Ambulatory Visit (INDEPENDENT_AMBULATORY_CARE_PROVIDER_SITE_OTHER): Payer: BC Managed Care – PPO | Admitting: *Deleted

## 2018-07-10 DIAGNOSIS — Z5181 Encounter for therapeutic drug level monitoring: Secondary | ICD-10-CM | POA: Diagnosis not present

## 2018-07-10 DIAGNOSIS — Z952 Presence of prosthetic heart valve: Secondary | ICD-10-CM

## 2018-07-10 LAB — POCT INR: INR: 3.2 — AB (ref 2.0–3.0)

## 2018-07-10 NOTE — Patient Instructions (Signed)
Take coumadin 1/2 tablet tonight then decrease dose to 1/2 tablet daily except 1 tablet on Sundays, Tuesdays and Thursdays Recheck in 7 weeks due to COVID 19

## 2018-07-30 ENCOUNTER — Other Ambulatory Visit: Payer: Self-pay | Admitting: Family Medicine

## 2018-08-05 ENCOUNTER — Other Ambulatory Visit: Payer: Self-pay | Admitting: Family Medicine

## 2018-08-15 MED ORDER — GLYBURIDE 5 MG PO TABS: ORAL_TABLET | ORAL | 4 refills | Status: DC

## 2018-08-15 MED ORDER — HYDROCODONE-ACETAMINOPHEN 5-325 MG PO TABS: 1.0000 | ORAL_TABLET | ORAL | 0 refills | Status: DC | PRN

## 2018-08-19 ENCOUNTER — Other Ambulatory Visit: Payer: Self-pay | Admitting: Cardiology

## 2018-08-27 ENCOUNTER — Ambulatory Visit (INDEPENDENT_AMBULATORY_CARE_PROVIDER_SITE_OTHER): Payer: BC Managed Care – PPO | Admitting: *Deleted

## 2018-08-27 DIAGNOSIS — Z5181 Encounter for therapeutic drug level monitoring: Secondary | ICD-10-CM

## 2018-08-27 DIAGNOSIS — Z952 Presence of prosthetic heart valve: Secondary | ICD-10-CM

## 2018-08-27 LAB — POCT INR: INR: 2.1 (ref 2.0–3.0)

## 2018-08-27 NOTE — Patient Instructions (Signed)
Continue coumadin 1/2 tablet daily except 1 tablet on Sundays, Tuesdays and Thursdays Recheck in 6 weeks

## 2018-08-29 ENCOUNTER — Other Ambulatory Visit: Payer: Self-pay | Admitting: Family Medicine

## 2018-09-10 ENCOUNTER — Other Ambulatory Visit: Payer: Self-pay | Admitting: Family Medicine

## 2018-09-10 ENCOUNTER — Telehealth: Payer: Self-pay | Admitting: Family Medicine

## 2018-09-10 NOTE — Telephone Encounter (Signed)
Pt is needing handicap place card form filled out by provider. Form in provider office for signature. Please advise. Thank you

## 2018-09-11 NOTE — Telephone Encounter (Signed)
Form was completed 

## 2018-09-12 NOTE — Telephone Encounter (Signed)
Patient pickup yesterday

## 2018-09-29 ENCOUNTER — Other Ambulatory Visit: Payer: Self-pay | Admitting: Family Medicine

## 2018-09-30 NOTE — Telephone Encounter (Signed)
May have 30-day needs virtual visit or in person if patient desires

## 2018-10-07 ENCOUNTER — Other Ambulatory Visit: Payer: Self-pay

## 2018-10-07 ENCOUNTER — Ambulatory Visit (INDEPENDENT_AMBULATORY_CARE_PROVIDER_SITE_OTHER): Payer: BC Managed Care – PPO | Admitting: *Deleted

## 2018-10-07 DIAGNOSIS — Z5181 Encounter for therapeutic drug level monitoring: Secondary | ICD-10-CM

## 2018-10-07 DIAGNOSIS — Z952 Presence of prosthetic heart valve: Secondary | ICD-10-CM

## 2018-10-07 LAB — POCT INR: INR: 2.7 (ref 2.0–3.0)

## 2018-10-07 NOTE — Patient Instructions (Signed)
Continue coumadin 1/2 tablet daily except 1 tablet on Sundays, Tuesdays and Thursdays Recheck in 6 weeks

## 2018-10-22 ENCOUNTER — Other Ambulatory Visit: Payer: Self-pay | Admitting: Family Medicine

## 2018-10-23 ENCOUNTER — Telehealth: Payer: Self-pay | Admitting: *Deleted

## 2018-10-23 NOTE — Telephone Encounter (Signed)
May have 2 months worth of medicine It is important for this patient to do a follow-up visit preferably in person but virtual is acceptable We will need to do lab work this fall as well

## 2018-10-23 NOTE — Telephone Encounter (Signed)
Sent through rx request also. Closed message since this is a duplicate

## 2018-10-23 NOTE — Telephone Encounter (Signed)
Fax from [place] [facility] requesting refill on pravastatin 20mg . Last med check up 02/20/18.

## 2018-11-01 ENCOUNTER — Other Ambulatory Visit: Payer: Self-pay | Admitting: Family Medicine

## 2018-11-04 NOTE — Telephone Encounter (Signed)
Needs to schedule a visit or virtual visit for September may have refill

## 2018-11-04 NOTE — Telephone Encounter (Signed)
[facility]-need to schedule virtual visit & route message back to the nurse

## 2018-11-04 NOTE — Telephone Encounter (Signed)
Please schedule and send back to nurses to send in refill. thanks

## 2018-11-05 NOTE — Telephone Encounter (Signed)
Patient scheduled medication follow up on 9/21

## 2018-11-18 ENCOUNTER — Ambulatory Visit (INDEPENDENT_AMBULATORY_CARE_PROVIDER_SITE_OTHER): Payer: BC Managed Care – PPO | Admitting: *Deleted

## 2018-11-18 ENCOUNTER — Other Ambulatory Visit: Payer: Self-pay

## 2018-11-18 DIAGNOSIS — Z5181 Encounter for therapeutic drug level monitoring: Secondary | ICD-10-CM

## 2018-11-18 DIAGNOSIS — Z952 Presence of prosthetic heart valve: Secondary | ICD-10-CM | POA: Diagnosis not present

## 2018-11-18 LAB — POCT INR: INR: 2.4 (ref 2.0–3.0)

## 2018-11-18 NOTE — Patient Instructions (Signed)
Continue coumadin 1/2 tablet daily except 1 tablet on Sundays, Tuesdays and Thursdays Recheck in 6 weeks  

## 2018-11-24 ENCOUNTER — Ambulatory Visit (INDEPENDENT_AMBULATORY_CARE_PROVIDER_SITE_OTHER): Payer: BC Managed Care – PPO | Admitting: Family Medicine

## 2018-11-24 DIAGNOSIS — E119 Type 2 diabetes mellitus without complications: Secondary | ICD-10-CM

## 2018-11-24 DIAGNOSIS — E782 Mixed hyperlipidemia: Secondary | ICD-10-CM | POA: Diagnosis not present

## 2018-11-24 DIAGNOSIS — I10 Essential (primary) hypertension: Secondary | ICD-10-CM

## 2018-11-24 MED ORDER — ONETOUCH DELICA LANCETS 33G MISC: 5 refills | Status: DC

## 2018-11-24 MED ORDER — LEVEMIR FLEXTOUCH 100 UNIT/ML ~~LOC~~ SOPN: PEN_INJECTOR | SUBCUTANEOUS | 5 refills | Status: DC

## 2018-11-24 MED ORDER — GLYBURIDE 5 MG PO TABS: ORAL_TABLET | ORAL | 5 refills | Status: DC

## 2018-11-24 MED ORDER — PRAVASTATIN SODIUM 20 MG PO TABS: ORAL_TABLET | ORAL | 5 refills | Status: DC

## 2018-11-24 MED ORDER — AMLODIPINE BESYLATE 10 MG PO TABS: 10.0000 mg | ORAL_TABLET | Freq: Every day | ORAL | 5 refills | Status: DC

## 2018-11-24 MED ORDER — METOPROLOL TARTRATE 50 MG PO TABS: ORAL_TABLET | ORAL | 1 refills | Status: DC

## 2018-11-24 MED ORDER — LISINOPRIL 40 MG PO TABS: 40.0000 mg | ORAL_TABLET | Freq: Every day | ORAL | 5 refills | Status: DC

## 2018-11-24 NOTE — Progress Notes (Signed)
Subjective:    Patient ID: [name], female    DOB: 29-Oct-1955, 63 y.o.   MRN: [id number]  Diabetes She presents for her follow-up diabetic visit. She has type 2 diabetes mellitus. Pertinent negatives for hypoglycemia include no confusion or dizziness. Pertinent negatives for diabetes include no chest pain, no fatigue, no polydipsia, no polyphagia and no weakness. Risk factors for coronary artery disease include diabetes mellitus, dyslipidemia, hypertension and post-menopausal. Current diabetic treatment includes insulin injections and oral agent (monotherapy). She is compliant with treatment all of the time. Her weight is stable. She is following a diabetic diet.   Patient for blood pressure check up.  The patient does have hypertension.  The patient is on medication.  Patient relates compliance with meds. Todays BP reviewed with the patient. Patient denies issues with medication. Patient relates reasonable diet. Patient tries to minimize salt. Patient aware of BP goals.  The best patient can tell her blood pressure seems to be doing well she denies any problems with it  Patient here for follow-up regarding cholesterol.  The patient does have hyperlipidemia.  Patient does try to maintain a reasonable diet.  Patient does take the medication on a regular basis.  Denies missing a dose.  The patient denies any obvious side effects.  Prior blood work results reviewed with the patient.  The patient is aware of his cholesterol goals and the need to keep it under good control to lessen the risk of disease. She does try to maintain compliance and watch her diet closely   Review of Systems  Constitutional: Negative for activity change, appetite change and fatigue.  HENT: Negative for congestion and rhinorrhea.   Respiratory: Negative for cough and shortness of breath.   Cardiovascular: Negative for chest pain and leg swelling.  Gastrointestinal: Negative for abdominal pain and diarrhea.   Endocrine: Negative for polydipsia and polyphagia.  Skin: Negative for color change.  Neurological: Negative for dizziness and weakness.  Psychiatric/Behavioral: Negative for behavioral problems and confusion.     Virtual Visit via Video Note  I connected with SELEENA DELPRADO on 11/24/18 at  2:00 PM EDT by a video enabled telemedicine application and verified that I am speaking with the correct person using two identifiers.  Location: Patient: home Provider: office   I discussed the limitations of evaluation and management by telemedicine and the availability of in person appointments. The patient expressed understanding and agreed to proceed.  History of Present Illness:    Observations/Objective:   Assessment and Plan:   Follow Up Instructions:    I discussed the assessment and treatment plan with the patient. The patient was provided an opportunity to ask questions and all were answered. The patient agreed with the plan and demonstrated an understanding of the instructions.   The patient was advised to call back or seek an in-person evaluation if the symptoms worsen or if the condition fails to improve as anticipated.  I provided 17 minutes of non-face-to-face time during this encounter.        Objective:   Physical Exam  Today's visit was via telephone Physical exam was not possible for this visit       Assessment & Plan:  Hyperlipidemia continue medication check lab work later this fall Follow-up in person later this fall  Diabetes hard to know for certain what the level of improvement we are seeing I believe it is necessary for the patient to do lab work again this fall and follow-up in person  continue current medications watch diet closely  His best patient knows her blood pressure doing good she will continue to watch diet take medicine and should try to do the best she can at minimizing starches and staying active to keep her weight down

## 2018-12-26 ENCOUNTER — Other Ambulatory Visit: Payer: Self-pay

## 2018-12-26 ENCOUNTER — Encounter: Payer: Self-pay | Admitting: Family Medicine

## 2018-12-26 ENCOUNTER — Ambulatory Visit (INDEPENDENT_AMBULATORY_CARE_PROVIDER_SITE_OTHER): Payer: BC Managed Care – PPO | Admitting: Family Medicine

## 2018-12-26 VITALS — BP 130/78 | Temp 96.5°F | Wt 249.2 lb

## 2018-12-26 DIAGNOSIS — E782 Mixed hyperlipidemia: Secondary | ICD-10-CM

## 2018-12-26 DIAGNOSIS — E119 Type 2 diabetes mellitus without complications: Secondary | ICD-10-CM | POA: Diagnosis not present

## 2018-12-26 DIAGNOSIS — I10 Essential (primary) hypertension: Secondary | ICD-10-CM

## 2018-12-26 DIAGNOSIS — Z23 Encounter for immunization: Secondary | ICD-10-CM

## 2018-12-26 LAB — POCT GLYCOSYLATED HEMOGLOBIN (HGB A1C): Hemoglobin A1C: 10.6 % — AB (ref 4.0–5.6)

## 2018-12-26 MED ORDER — SHINGRIX 50 MCG/0.5ML IM SUSR: 0.5000 mL | Freq: Once | INTRAMUSCULAR | 1 refills | Status: AC

## 2018-12-26 MED ORDER — GLIPIZIDE 5 MG PO TABS: ORAL_TABLET | ORAL | 5 refills | Status: DC

## 2018-12-26 MED ORDER — TRULICITY 0.75 MG/0.5ML ~~LOC~~ SOAJ: SUBCUTANEOUS | 5 refills | Status: DC

## 2018-12-26 NOTE — Progress Notes (Signed)
Subjective:    Patient ID: [name], female    DOB: 04-13-1955, 63 y.o.   MRN: [id number]  HPI Pt here today for A1C and flu shot. Pt was seen back 11/24/2018 for DM  The patient was seen today as part of a comprehensive diabetic check up.the patient does have diabetes.  The patient follows here on a regular basis.  The patient relates medication compliance. No significant side effects to the medications. Denies any low glucose spells. Relates compliance with diet to a reasonable level. Patient does do labwork intermittently and understands the dangers of diabetes. She relates that she is not doing a good job of checking her sugars and she also states she is eating a lot more carbohydrates and she knows she should she is unable to do much in the way of any exercise denies any low sugar spells states morning times usually around 120 but later in the day 300-400 is the usual  Patient for blood pressure check up.  The patient does have hypertension.  The patient is on medication.  Patient relates compliance with meds. Todays BP reviewed with the patient. Patient denies issues with medication. Patient relates reasonable diet. Patient tries to minimize salt. Patient aware of BP goals. Takes her medicine denies any chest tightness pressure pain shortness of breath watches salt in her diet unable to stay active because of orthopedic issues  Patient here for follow-up regarding cholesterol.  The patient does have hyperlipidemia.  Patient does try to maintain a reasonable diet.  Patient does take the medication on a regular basis.  Denies missing a dose.  The patient denies any obvious side effects.  Prior blood work results reviewed with the patient.  The patient is aware of his cholesterol goals and the need to keep it under good control to lessen the risk of disease. Takes her cholesterol medicine regular basis denies any problems  Has history of heart surgery with valve surgery on Coumadin doing  well with this denies any bleeding issues  25 minutes was spent with the patient.  This statement verifies that 25 minutes was indeed spent with the patient.  More than 50% of this visit-total duration of the visit-was spent in counseling and coordination of care. The issues that the patient came in for today as reflected in the diagnosis (s) please refer to documentation for further details.   Review of Systems  Constitutional: Negative for activity change, appetite change and fatigue.  HENT: Negative for congestion and rhinorrhea.   Respiratory: Negative for cough and shortness of breath.   Cardiovascular: Negative for chest pain and leg swelling.  Gastrointestinal: Negative for abdominal pain and diarrhea.  Endocrine: Negative for polydipsia and polyphagia.  Skin: Negative for color change.  Neurological: Negative for dizziness and weakness.  Psychiatric/Behavioral: Negative for behavioral problems and confusion.       Objective:   Physical Exam Vitals signs reviewed.  Constitutional:      General: She is not in acute distress. HENT:     Head: Normocephalic and atraumatic.  Eyes:     General:        Right eye: No discharge.        Left eye: No discharge.  Neck:     Trachea: No tracheal deviation.  Cardiovascular:     Rate and Rhythm: Normal rate and regular rhythm.     Heart sounds: Normal heart sounds. No murmur.  Pulmonary:     Effort: Pulmonary effort is normal. No respiratory  distress.     Breath sounds: Normal breath sounds.  Lymphadenopathy:     Cervical: No cervical adenopathy.  Skin:    General: Skin is warm and dry.  Neurological:     Mental Status: She is alert.     Coordination: Coordination normal.  Psychiatric:        Behavior: Behavior normal.           Assessment & Plan:  The patient was seen today as part of a comprehensive visit for diabetes. The importance of keeping her A1c at or below 7 was discussed.  Diabetes under poor  control Importance of regular physical activity was discussed.   The importance of adherence to medication as well as a controlled low starch/sugar diet was also discussed.  Standard follow-up visit recommended.  Also patient aware failure to keep diabetes under control increases the risk of complications. Subpar control with her diabetes we talked about short acting insulin versus medication like Trulicity she opts to try Trulicity she will do lab work again in approximately 3 months she will send [place] some readings within the next few weeks may need to adjust the dosage  HTN- Patient was seen today as part of a visit regarding hypertension. The importance of healthy diet and regular physical activity was discussed. The importance of compliance with medications discussed.  Ideal goal is to keep blood pressure low elevated levels certainly below [id number] when possible.  The patient was counseled that keeping blood pressure under control lessen his risk of complications.  The importance of regular follow-ups was discussed with the patient.  Low-salt diet such as DASH recommended.  Regular physical activity was recommended as well.  Patient was advised to keep regular follow-ups. Blood pressure under good control continue current measures  The patient was seen today as part of an evaluation regarding hyperlipidemia.  Recent lab work has been reviewed with the patient as well as the goals for good cholesterol care.  In addition to this medications have been discussed the importance of compliance with diet and medications discussed as well.  Finally the patient is aware that poor control of cholesterol, noncompliance can dramatically increase the risk of complications. The patient will keep regular office visits and the patient does agreed to periodic lab work. She will do lab work in a few months time previous labs look good  Underlying heart disease stable

## 2018-12-30 ENCOUNTER — Other Ambulatory Visit: Payer: Self-pay

## 2018-12-30 ENCOUNTER — Ambulatory Visit (INDEPENDENT_AMBULATORY_CARE_PROVIDER_SITE_OTHER): Payer: BC Managed Care – PPO | Admitting: *Deleted

## 2018-12-30 DIAGNOSIS — Z952 Presence of prosthetic heart valve: Secondary | ICD-10-CM | POA: Diagnosis not present

## 2018-12-30 DIAGNOSIS — Z5181 Encounter for therapeutic drug level monitoring: Secondary | ICD-10-CM

## 2018-12-30 LAB — POCT INR: INR: 3.1 — AB (ref 2.0–3.0)

## 2018-12-30 NOTE — Patient Instructions (Signed)
Continue coumadin 1/2 tablet daily except 1 tablet on Sundays, Tuesdays and Thursdays Recheck in 6 weeks  Eat extra greens/salad today

## 2019-01-19 ENCOUNTER — Telehealth: Payer: Self-pay | Admitting: *Deleted

## 2019-01-19 NOTE — Telephone Encounter (Signed)
Patient verbally consented for telehealth visits with [facility] and understands that her insurance company will be billed for the encounter.   

## 2019-01-31 ENCOUNTER — Other Ambulatory Visit: Payer: Self-pay | Admitting: Family Medicine

## 2019-02-02 ENCOUNTER — Encounter: Payer: Self-pay | Admitting: Cardiology

## 2019-02-02 NOTE — Progress Notes (Signed)
Virtual Visit via Telephone Note   This visit type was conducted due to national recommendations for restrictions regarding the COVID-19 Pandemic (e.g. social distancing) in an effort to limit this patient's exposure and mitigate transmission in our community.  Due to her co-morbid illnesses, this patient is at least at moderate risk for complications without adequate follow up.  This format is felt to be most appropriate for this patient at this time.  The patient did not have access to video technology/had technical difficulties with video requiring transitioning to audio format only (telephone).  All issues noted in this document were discussed and addressed.  No physical exam could be performed with this format.  Please refer to the patient's chart for her  consent to telehealth for [facility].   Date:  02/03/2019   ID:  [name], DOB 29-Aug-1955, MRN [id number]  Patient Location: Home Provider Location: Office  PCP:  Kathyrn Drown, MD  Cardiologist:  Rozann Lesches, MD Electrophysiologist:  None   Evaluation Performed:  Follow-Up Visit  Chief Complaint:  Cardiac follow-up  History of Present Illness:    [name] is a 63 y.o. female last seen in July 2019 by Ms. Bonnell Public PA-C.  We spoke by phone today.  She states that she has been doing well overall.  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  She and her husband are in the process of renovating her home.  She does not report any angina symptoms with stable NYHA class II dyspnea, somewhat worse when she walks upstairs but this has been chronic.  She remains on Coumadin with follow-up in the anticoagulation clinic.  Last INR 3.1.  She is status post mechanical AVR in 2017 (21 mm St. Jude Regent mechanical valve).  She does not report any bleeding problems.  I reviewed her remaining medications which are outlined below.  The patient does not have symptoms concerning for COVID-19  infection (fever, chills, cough, or new shortness of breath).    Past Medical History:  Diagnosis Date  . Aortic stenosis   . Essential hypertension   . Insomnia   . Mixed hyperlipidemia   . Nocturia   . Numbness    Both feet  . Restless leg syndrome   . Ruptured disk   . Type 2 diabetes mellitus ([place])    Past Surgical History:  Procedure Laterality Date  . AORTIC VALVE REPLACEMENT N/A 01/03/2016   Procedure: AORTIC VALVE REPLACEMENT (AVR);  Surgeon: Gaye Pollack, MD;  Location: [place];  Service: Open Heart Surgery;  Laterality: N/A;  . CARDIAC CATHETERIZATION N/A 12/16/2015   Procedure: Right/Left Heart Cath and Coronary Angiography;  Surgeon: Belva Crome, MD;  Location: [facility];  Service: Cardiovascular;  Laterality: N/A;  . COLONOSCOPY  2007   Dr. Gala Romney: single anal papilla, otherwise unremarkable.   . COLONOSCOPY WITH PROPOFOL N/A 01/09/2018   Procedure: COLONOSCOPY WITH PROPOFOL;  Surgeon: Daneil Dolin, MD;  [facility];  Service: Endoscopy;  Laterality: N/A;  8:15am  . POLYPECTOMY  01/09/2018   Procedure: POLYPECTOMY;  Surgeon: Daneil Dolin, MD;  [facility];  Service: Endoscopy;;  rectal polyp  . TEE WITHOUT CARDIOVERSION N/A 01/03/2016   Procedure: TRANSESOPHAGEAL ECHOCARDIOGRAM (TEE);  Surgeon: Gaye Pollack, MD;  Location: [place];  Service: Open Heart Surgery;  Laterality: N/A;  . TOE SURGERY     left foot middle      Current  Meds  Medication Sig  . acetaminophen (TYLENOL) 500 MG tablet Take 1,000 mg by mouth every 6 (six) hours as needed for moderate pain or headache.  Marland Kitchen amLODipine (NORVASC) 10 MG tablet Take 1 tablet (10 mg total) by mouth daily.  Marland Kitchen aspirin 81 MG tablet Take 81 mg by mouth daily.  . Dulaglutide (TRULICITY) A999333 [id number] SOPN Inject 0.75 mg into skin once weekly  . glipiZIDE (GLUCOTROL) 5 MG tablet Take one tablet po BID  . HYDROcodone-acetaminophen (NORCO/VICODIN) 5-325 MG tablet Take 1 tablet by mouth  every 4 (four) hours as needed.  . Insulin Detemir (LEVEMIR FLEXTOUCH) 100 UNIT/ML Pen INJECT 34 UNITS INTO THE SKIN AT BEDTIME.  Marland Kitchen lisinopril (ZESTRIL) 40 MG tablet Take 1 tablet (40 mg total) by mouth daily.  . metoprolol tartrate (LOPRESSOR) 50 MG tablet TAKE (1) TABLET BY MOUTH TWICE DAILY.  . mometasone (ELOCON) 0.1 % cream Apply 1 application topically 2 (two) times daily as needed (for eczema).  . mupirocin ointment (BACTROBAN) 2 % Apply daily for 2 weeks  . OneTouch Delica Lancets 99991111 MISC USE AS DIRECTED  . pravastatin (PRAVACHOL) 20 MG tablet TAKE 1 TABLET BY MOUTH AT BEDTIME FOR CHOLESTEROL. NEEDS OFFICE VISIT AND LABS.  . SURE COMFORT PEN NEEDLES 31G X 5 MM MISC USE AS DIRECTED ONCE DAILY.  Marland Kitchen terconazole (TERAZOL 7) 0.4 % vaginal cream Place 1 applicator vaginally at bedtime. (Patient taking differently: Place 1 applicator vaginally at bedtime as needed (for yeast infection). )  . warfarin (COUMADIN) 5 MG tablet TAKE 1/2 TABLET DAILY EXCEPT 1 TABLET ON SUNDAYS, TUESDAYS AND THURSDAYS     Allergies:   Patient has no known allergies.   Social History   Tobacco Use  . Smoking status: Never Smoker  . Smokeless tobacco: Never Used  Substance Use Topics  . Alcohol use: No  . Drug use: No     Family Hx: The patient's family history includes Coronary artery disease in her father; Diabetes in her brother and mother; Diabetes type II in her brother and mother; Hyperlipidemia in her sister; Hypertension in her father, mother, and sister. There is no history of Colon cancer.  ROS:   Please see the history of present illness. All other systems reviewed and are negative.   Prior CV studies:   The following studies were reviewed today:  TEE 01/03/2016: Result status: Final result   Left ventricle: LV systolic function is normal with an EF of 55-60%.  Septum: No Patent Foramen Ovale present.  Left atrium: Patent foramen ovale not present.  Aortic valve: The valve is possible  bicuspid. Severe valve thickening present. Severe valve calcification present. Severely decreased leaflet separation. Severe stenosis.  Mitral valve: Trace regurgitation.   Cardiac catheterization 12/16/2015:  Prox LAD to Mid LAD lesion, 40 %stenosed.  Ost 1st Diag to 1st Diag lesion, 25 %stenosed.  There is severe aortic valve stenosis.  Hemodynamic findings consistent with pulmonary hypertension.    Severe aortic stenosis documented by hemodynamics. Mean gradient 53.8 mmHg and AVA .68 cm2.  Widely patent coronary arteries. 20-40% irregularities in the proximal to mid LAD.  Normal right heart pressures.  LV angiogram not performed due to CKD. Recent LVEF 60% by echocardiogram.  Labs/Other Tests and Data Reviewed:    EKG:  An ECG dated 09/30/2017 was personally reviewed today and demonstrated:  Sinus bradycardia.  Recent Labs: 05/21/2018: ALT 11; BUN 12; Creatinine, Ser 0.96; Potassium 5.1; Sodium 138   Recent Lipid Panel Lab Results  Component Value  Date/Time   CHOL 109 05/21/2018 08:22 AM   TRIG 135 05/21/2018 08:22 AM   HDL 45 05/21/2018 08:22 AM   CHOLHDL 2.4 05/21/2018 08:22 AM   CHOLHDL 2.5 07/02/2014 07:14 AM   LDLCALC 37 05/21/2018 08:22 AM    Wt Readings from Last 3 Encounters:  02/03/19 248 lb (112.5 kg)  12/26/18 249 lb 3.2 oz (113 kg)  05/14/18 252 lb 6.4 oz (114.5 kg)     Objective:    Vital Signs:  BP 98/72   Pulse 73   Ht 5\' 7"  (1.702 m)   Wt 248 lb (112.5 kg)   BMI 38.84 kg/m    Patient spoke in full sentences, not short of breath. No audible wheezing or coughing. Speech pattern normal.  ASSESSMENT & PLAN:    1.  Aortic stenosis status post St. Jude mechanical AVR in 2017.  She is clinically stable and continues on Coumadin with follow-up in anticoagulation clinic.  Follow-up chest CTA in August of last year showed maximum aortic diameter 3.7 cm.  2.  Essential hypertension by history, blood pressure low normal today, she is asymptomatic.   No changes to current regimen.  3.  Mixed hyperlipidemia, continues on Pravachol.  Most recent LDL 37.  COVID-19 Education: The signs and symptoms of COVID-19 were discussed with the patient and how to seek care for testing (follow up with PCP or arrange E-visit).  The importance of social distancing was discussed today.  Time:   Today, I have spent 6 minutes with the patient with telehealth technology discussing the above problems.     Medication Adjustments/Labs and Tests Ordered: Current medicines are reviewed at length with the patient today.  Concerns regarding medicines are outlined above.   Tests Ordered: No orders of the defined types were placed in this encounter.   Medication Changes: No orders of the defined types were placed in this encounter.   Follow Up:  In Person 1 year in the [place] office.  Signed, Rozann Lesches, MD  02/03/2019 10:29 AM    [place]

## 2019-02-03 ENCOUNTER — Telehealth (INDEPENDENT_AMBULATORY_CARE_PROVIDER_SITE_OTHER): Payer: BC Managed Care – PPO | Admitting: Cardiology

## 2019-02-03 ENCOUNTER — Encounter: Payer: Self-pay | Admitting: Cardiology

## 2019-02-03 VITALS — BP 98/72 | HR 73 | Ht 67.0 in | Wt 248.0 lb

## 2019-02-03 DIAGNOSIS — E782 Mixed hyperlipidemia: Secondary | ICD-10-CM | POA: Diagnosis not present

## 2019-02-03 DIAGNOSIS — I10 Essential (primary) hypertension: Secondary | ICD-10-CM | POA: Diagnosis not present

## 2019-02-03 DIAGNOSIS — Z952 Presence of prosthetic heart valve: Secondary | ICD-10-CM | POA: Diagnosis not present

## 2019-02-03 NOTE — Patient Instructions (Signed)
Medication Instructions:  Your physician recommends that you continue on your current medications as directed. Please refer to the Current Medication list given to you today.  *If you need a refill on your cardiac medications before your next appointment, please call your pharmacy*  Lab Work: None today If you have labs (blood work) drawn today and your tests are completely normal, you will receive your results only by: . MyChart Message (if you have MyChart) OR . A paper copy in the mail If you have any lab test that is abnormal or we need to change your treatment, we will call you to review the results.  Testing/Procedures: None today  Follow-Up: At [facility], you and your health needs are our priority.  As part of our continuing mission to provide you with exceptional heart care, we have created designated Provider Care Teams.  These Care Teams include your primary Cardiologist (physician) and Advanced Practice Providers (APPs -  Physician Assistants and Nurse Practitioners) who all work together to provide you with the care you need, when you need it.  Your next appointment:   12 month(s)  The format for your next appointment:   In Person  Provider:   Samuel McDowell, MD  Other Instructions None     Thank you for choosing [facility] !        

## 2019-02-10 ENCOUNTER — Ambulatory Visit (INDEPENDENT_AMBULATORY_CARE_PROVIDER_SITE_OTHER): Payer: BC Managed Care – PPO | Admitting: *Deleted

## 2019-02-10 ENCOUNTER — Other Ambulatory Visit: Payer: Self-pay

## 2019-02-10 DIAGNOSIS — Z5181 Encounter for therapeutic drug level monitoring: Secondary | ICD-10-CM

## 2019-02-10 DIAGNOSIS — Z952 Presence of prosthetic heart valve: Secondary | ICD-10-CM

## 2019-02-10 LAB — POCT INR: INR: 3 (ref 2.0–3.0)

## 2019-02-10 NOTE — Patient Instructions (Signed)
Continue coumadin 1/2 tablet daily except 1 tablet on Sundays, Tuesdays and Thursdays Recheck in 6 weeks  Continue greens

## 2019-03-24 ENCOUNTER — Ambulatory Visit (INDEPENDENT_AMBULATORY_CARE_PROVIDER_SITE_OTHER): Payer: Medicare PPO | Admitting: *Deleted

## 2019-03-24 ENCOUNTER — Other Ambulatory Visit: Payer: Self-pay

## 2019-03-24 DIAGNOSIS — Z5181 Encounter for therapeutic drug level monitoring: Secondary | ICD-10-CM

## 2019-03-24 DIAGNOSIS — Z952 Presence of prosthetic heart valve: Secondary | ICD-10-CM

## 2019-03-24 LAB — POCT INR: INR: 2.2 (ref 2.0–3.0)

## 2019-03-24 NOTE — Patient Instructions (Signed)
Continue coumadin 1/2 tablet daily except 1 tablet on Sundays, Tuesdays and Thursdays Recheck in 6 weeks  Continue greens

## 2019-03-27 LAB — LIPID PANEL
Chol/HDL Ratio: 2.5 ratio (ref 0.0–4.4)
Cholesterol, Total: 130 mg/dL (ref 100–199)
HDL: 51 mg/dL (ref >39)
LDL Chol Calc (NIH): 51 mg/dL (ref 0–99)
Triglycerides: 166 mg/dL — ABNORMAL HIGH (ref 0–149)
VLDL Cholesterol Cal: 28 mg/dL (ref 5–40)

## 2019-03-27 LAB — HEPATIC FUNCTION PANEL
ALT: 15 IU/L (ref 0–32)
AST: 22 IU/L (ref 0–40)
Albumin: 4.1 g/dL (ref 3.8–4.8)
Alkaline Phosphatase: 92 IU/L (ref 39–117)
Bilirubin Total: 0.4 mg/dL (ref 0.0–1.2)
Bilirubin, Direct: 0.15 mg/dL (ref 0.00–0.40)
Total Protein: 6.6 g/dL (ref 6.0–8.5)

## 2019-03-27 LAB — BASIC METABOLIC PANEL
BUN/Creatinine Ratio: 17 (ref 12–28)
BUN: 17 mg/dL (ref 8–27)
CO2: 20 mmol/L (ref 20–29)
Calcium: 9.4 mg/dL (ref 8.7–10.3)
Chloride: 106 mmol/L (ref 96–106)
Creatinine, Ser: 0.98 mg/dL (ref 0.57–1.00)
GFR calc Af Amer: 70 mL/min/{1.73_m2} (ref >59)
GFR calc non Af Amer: 61 mL/min/{1.73_m2} (ref >59)
Glucose: 254 mg/dL — ABNORMAL HIGH (ref 65–99)
Potassium: 5 mmol/L (ref 3.5–5.2)
Sodium: 139 mmol/L (ref 134–144)

## 2019-03-27 LAB — MICROALBUMIN / CREATININE URINE RATIO
Creatinine, Urine: 82.8 mg/dL
Microalb/Creat Ratio: 21 mg/g creat (ref 0–29)
Microalbumin, Urine: 17 ug/mL

## 2019-03-27 LAB — HEMOGLOBIN A1C
Est. average glucose Bld gHb Est-mCnc: 217 mg/dL
Hgb A1c MFr Bld: 9.2 % — ABNORMAL HIGH (ref 4.8–5.6)

## 2019-04-28 LAB — HM DIABETES EYE EXAM

## 2019-05-05 ENCOUNTER — Ambulatory Visit (INDEPENDENT_AMBULATORY_CARE_PROVIDER_SITE_OTHER): Payer: Medicare PPO | Admitting: *Deleted

## 2019-05-05 ENCOUNTER — Other Ambulatory Visit: Payer: Self-pay

## 2019-05-05 DIAGNOSIS — Z952 Presence of prosthetic heart valve: Secondary | ICD-10-CM | POA: Diagnosis not present

## 2019-05-05 DIAGNOSIS — Z5181 Encounter for therapeutic drug level monitoring: Secondary | ICD-10-CM

## 2019-05-05 LAB — POCT INR: INR: 2.2 (ref 2.0–3.0)

## 2019-05-05 NOTE — Patient Instructions (Signed)
Continue coumadin 1/2 tablet daily except 1 tablet on Sundays, Tuesdays and Thursdays Recheck in 6 weeks  Continue greens

## 2019-05-29 ENCOUNTER — Other Ambulatory Visit: Payer: Self-pay | Admitting: Family Medicine

## 2019-05-29 NOTE — Telephone Encounter (Signed)
May have 3 months on each Needs to do lab work and office visit by the start of June

## 2019-06-01 NOTE — Telephone Encounter (Signed)
Medication sent to pharmacy. Pt will call back to set up appointment once she has her work schedule.

## 2019-06-05 ENCOUNTER — Other Ambulatory Visit: Payer: Self-pay | Admitting: Family Medicine

## 2019-06-06 NOTE — Telephone Encounter (Signed)
May have 90-day needs follow-up visit in the spring 

## 2019-06-16 ENCOUNTER — Telehealth: Payer: Self-pay | Admitting: Family Medicine

## 2019-06-16 ENCOUNTER — Other Ambulatory Visit: Payer: Self-pay | Admitting: Family Medicine

## 2019-06-16 MED ORDER — ALPRAZOLAM 0.5 MG PO TABS: ORAL_TABLET | ORAL | 0 refills | Status: DC

## 2019-06-16 NOTE — Telephone Encounter (Signed)
Husband passed away this morning

## 2019-06-16 NOTE — Telephone Encounter (Signed)
Did discuss the case with [facility] Expressed sadness of little Ronalee Belts passing  Please do a condolence card and have it forwarded to me so I can send it to the family thank you

## 2019-06-16 NOTE — Telephone Encounter (Signed)
I tried calling patient without success Medication was sent into [facility] I will try calling patient again later today

## 2019-06-16 NOTE — Telephone Encounter (Signed)
Patient is requesting something for her nerves due to spouse passing away. She doesn't know if she can take xanax due her other medications she on. Please advise [employer]

## 2019-06-20 ENCOUNTER — Other Ambulatory Visit: Payer: Self-pay | Admitting: Family Medicine

## 2019-06-22 ENCOUNTER — Other Ambulatory Visit (HOSPITAL_COMMUNITY): Payer: Self-pay | Admitting: Family Medicine

## 2019-06-22 DIAGNOSIS — Z1231 Encounter for screening mammogram for malignant neoplasm of breast: Secondary | ICD-10-CM

## 2019-06-22 NOTE — Telephone Encounter (Signed)
4 refills follow-up by early summer

## 2019-06-29 ENCOUNTER — Other Ambulatory Visit: Payer: Self-pay

## 2019-06-29 ENCOUNTER — Ambulatory Visit (HOSPITAL_COMMUNITY)
Admission: RE | Admit: 2019-06-29 | Discharge: 2019-06-29 | Disposition: A | Discharge status: Home / Self Care | Payer: Medicare PPO | Source: Ambulatory Visit | Attending: Family Medicine | Admitting: Family Medicine

## 2019-06-29 DIAGNOSIS — Z1231 Encounter for screening mammogram for malignant neoplasm of breast: Secondary | ICD-10-CM | POA: Diagnosis not present

## 2019-06-30 ENCOUNTER — Other Ambulatory Visit (HOSPITAL_COMMUNITY): Payer: Self-pay | Admitting: Family Medicine

## 2019-06-30 DIAGNOSIS — R928 Other abnormal and inconclusive findings on diagnostic imaging of breast: Secondary | ICD-10-CM

## 2019-07-07 ENCOUNTER — Ambulatory Visit (HOSPITAL_COMMUNITY)
Admission: RE | Admit: 2019-07-07 | Discharge: 2019-07-07 | Disposition: A | Discharge status: Home / Self Care | Payer: Medicare PPO | Source: Ambulatory Visit | Attending: Family Medicine | Admitting: Family Medicine

## 2019-07-07 ENCOUNTER — Other Ambulatory Visit: Payer: Self-pay

## 2019-07-07 DIAGNOSIS — R928 Other abnormal and inconclusive findings on diagnostic imaging of breast: Secondary | ICD-10-CM | POA: Diagnosis present

## 2019-07-16 ENCOUNTER — Other Ambulatory Visit: Payer: Self-pay

## 2019-07-16 ENCOUNTER — Ambulatory Visit (INDEPENDENT_AMBULATORY_CARE_PROVIDER_SITE_OTHER): Payer: Medicare PPO | Admitting: *Deleted

## 2019-07-16 DIAGNOSIS — Z952 Presence of prosthetic heart valve: Secondary | ICD-10-CM | POA: Diagnosis not present

## 2019-07-16 DIAGNOSIS — Z5181 Encounter for therapeutic drug level monitoring: Secondary | ICD-10-CM | POA: Diagnosis not present

## 2019-07-16 LAB — POCT INR: INR: 1.9 — AB (ref 2.0–3.0)

## 2019-07-16 NOTE — Patient Instructions (Signed)
Take 1 tablet today and tomorrow then resume 1/2 tablet daily except 1 tablet on Sundays, Tuesdays and Thursdays Recheck in 6 weeks  Continue greens

## 2019-07-17 ENCOUNTER — Other Ambulatory Visit: Payer: Self-pay | Admitting: Family Medicine

## 2019-07-31 ENCOUNTER — Other Ambulatory Visit: Payer: Self-pay | Admitting: Cardiology

## 2019-07-31 ENCOUNTER — Other Ambulatory Visit: Payer: Self-pay | Admitting: Family Medicine

## 2019-08-04 NOTE — Telephone Encounter (Signed)
Scheduled 6/24

## 2019-08-04 NOTE — Telephone Encounter (Signed)
30-day +1 refill on all needs follow-up visit this summer

## 2019-08-04 NOTE — Telephone Encounter (Signed)
Please contact patient to set up appt. Thank you 

## 2019-08-04 NOTE — Telephone Encounter (Signed)
Attempted to contact pt and only number on file is busy signal.

## 2019-08-27 ENCOUNTER — Ambulatory Visit (INDEPENDENT_AMBULATORY_CARE_PROVIDER_SITE_OTHER): Payer: Medicare PPO | Admitting: *Deleted

## 2019-08-27 ENCOUNTER — Other Ambulatory Visit: Payer: Self-pay

## 2019-08-27 ENCOUNTER — Ambulatory Visit (INDEPENDENT_AMBULATORY_CARE_PROVIDER_SITE_OTHER): Payer: Medicare PPO | Admitting: Family Medicine

## 2019-08-27 VITALS — BP 118/76 | Temp 97.6°F | Ht 66.0 in | Wt 242.6 lb

## 2019-08-27 DIAGNOSIS — Z780 Asymptomatic menopausal state: Secondary | ICD-10-CM | POA: Diagnosis not present

## 2019-08-27 DIAGNOSIS — E119 Type 2 diabetes mellitus without complications: Secondary | ICD-10-CM

## 2019-08-27 DIAGNOSIS — Z5181 Encounter for therapeutic drug level monitoring: Secondary | ICD-10-CM

## 2019-08-27 DIAGNOSIS — I10 Essential (primary) hypertension: Secondary | ICD-10-CM

## 2019-08-27 DIAGNOSIS — Z23 Encounter for immunization: Secondary | ICD-10-CM

## 2019-08-27 DIAGNOSIS — Z79899 Other long term (current) drug therapy: Secondary | ICD-10-CM

## 2019-08-27 DIAGNOSIS — Z952 Presence of prosthetic heart valve: Secondary | ICD-10-CM

## 2019-08-27 DIAGNOSIS — Z13820 Encounter for screening for osteoporosis: Secondary | ICD-10-CM

## 2019-08-27 LAB — POCT INR: INR: 1.7 — AB (ref 2.0–3.0)

## 2019-08-27 LAB — POCT GLYCOSYLATED HEMOGLOBIN (HGB A1C): Hemoglobin A1C: 8.2 % — AB (ref 4.0–5.6)

## 2019-08-27 MED ORDER — PRAVASTATIN SODIUM 20 MG PO TABS: ORAL_TABLET | ORAL | 5 refills | Status: DC

## 2019-08-27 MED ORDER — METOPROLOL TARTRATE 50 MG PO TABS: ORAL_TABLET | ORAL | 5 refills | Status: DC

## 2019-08-27 MED ORDER — TRULICITY 1.5 MG/0.5ML ~~LOC~~ SOAJ
1.5000 mg | SUBCUTANEOUS | 5 refills | Status: DC
Start: 2019-08-27 — End: 2020-01-07

## 2019-08-27 MED ORDER — LISINOPRIL 40 MG PO TABS: 40.0000 mg | ORAL_TABLET | Freq: Every day | ORAL | 5 refills | Status: DC

## 2019-08-27 MED ORDER — AMLODIPINE BESYLATE 10 MG PO TABS: 10.0000 mg | ORAL_TABLET | Freq: Every day | ORAL | 5 refills | Status: DC

## 2019-08-27 MED ORDER — GLIPIZIDE 5 MG PO TABS: ORAL_TABLET | ORAL | 5 refills | Status: DC

## 2019-08-27 NOTE — Progress Notes (Signed)
Subjective:    Patient ID: [name], female    DOB: 1955/08/21, 64 y.o.   MRN: [id number]  Diabetes She presents for her follow-up diabetic visit. She has type 2 diabetes mellitus. Risk factors for coronary artery disease include dyslipidemia and post-menopausal. Current diabetic treatment includes insulin injections and oral agent (monotherapy). She is compliant with treatment all of the time. Her weight is stable. She is following a diabetic diet.   Results for orders placed or performed in visit on 08/27/19  POCT glycosylated hemoglobin (Hb A1C)  Result Value Ref Range   Hemoglobin A1C 8.2 (A) 4.0 - 5.6 %   HbA1c POC (<> result, manual entry)     HbA1c, POC (prediabetic range)     HbA1c, POC (controlled diabetic range)     Fall Risk  08/27/2019 12/26/2018 06/15/2015 07/06/2014 03/30/2014  Falls in the past year? 0 0 No No No  Follow up Falls evaluation completed Falls evaluation completed - - -   Type 2 diabetes mellitus not at goal [facility]) - Plan: POCT glycosylated hemoglobin (Hb A1C), Hemoglobin A1c, Comprehensive Metabolic Panel (CMET), CANCELED: Comprehensive metabolic panel  Essential hypertension - Plan: Comprehensive Metabolic Panel (CMET), CANCELED: Comprehensive metabolic panel  Need for vaccination - Plan: Pneumococcal conjugate vaccine 13-valent IM  Post-menopausal - Plan: DG Bone Density  Screening for osteoporosis - Plan: DG Bone Density  High risk medication use - Plan: CBC with Differential/Platelet, Comprehensive Metabolic Panel (CMET), CANCELED: Comprehensive metabolic panel     Review of Systems Patient at times feels somewhat down but denies being depressed she is working through the grief of losing her husband she denies any chest pain she does get a little bit out of breath if she pushes herself she is started to do her exercise regimen    Objective:   Physical Exam Per artificial valve had the expected sound Lungs are clear no crackles abdomen soft  obese extremities no edema foot exam normal       Assessment & Plan:  1. Type 2 diabetes mellitus not at goal [facility]) Subpar control bump up dose of Trulicity 1.5 mg patient is to record her glucoses on a regular basis and then back to [place] for further evaluation and consideration may needed additional adjustment recommend patient do lab work before her next visit in 3 months - POCT glycosylated hemoglobin (Hb A1C) - Hemoglobin A1c - Comprehensive Metabolic Panel (CMET) We did talk about better choices with the diet and doing some mild activity such as walking on the treadmill at a low speed if it does cause significant shortness of breath or chest pressure she is to let us know 2. Essential hypertension Blood pressure good control continue current medication minimize salt in diet stay active - Comprehensive Metabolic Panel (CMET) She states she is initiating a walking program at a low speed she will gradually titrate up she would do this every other day  She is dealing with some grief related to the loss of her spouse but is not depressed.  We talked about normal grieving process and if she feels things are getting worse she is to let us know.  I do not feel the need to initiate any medicines today-not suicidal 3. Need for vaccination Pneumonia shot today - Pneumococcal conjugate vaccine 13-valent IM  4. Post-menopausal Bone density to rule out osteoporosis - DG Bone Density  5. Screening for osteoporosis Same - DG Bone Density  6. High risk medication use High risk meds check lab  work await results.  Patient on anticoagulants check CBC.  Patient on hypertensive medicines check metabolic 7 - CBC with Differential/Platelet - Comprehensive Metabolic Panel (CMET)

## 2019-08-27 NOTE — Patient Instructions (Signed)
Increase warfarin to 1 tablet daily except 1/2 tablet on Tuesdays and Saturdays Recheck in 3 weeks  Continue greens

## 2019-09-10 ENCOUNTER — Other Ambulatory Visit: Payer: Self-pay

## 2019-09-10 ENCOUNTER — Ambulatory Visit (HOSPITAL_COMMUNITY)
Admission: RE | Admit: 2019-09-10 | Discharge: 2019-09-10 | Disposition: A | Discharge status: Home / Self Care | Payer: Medicare PPO | Source: Ambulatory Visit | Attending: Family Medicine | Admitting: Family Medicine

## 2019-09-10 DIAGNOSIS — Z780 Asymptomatic menopausal state: Secondary | ICD-10-CM | POA: Insufficient documentation

## 2019-09-10 DIAGNOSIS — Z13820 Encounter for screening for osteoporosis: Secondary | ICD-10-CM | POA: Diagnosis present

## 2019-09-21 ENCOUNTER — Ambulatory Visit (INDEPENDENT_AMBULATORY_CARE_PROVIDER_SITE_OTHER): Payer: Medicare PPO | Admitting: *Deleted

## 2019-09-21 DIAGNOSIS — Z5181 Encounter for therapeutic drug level monitoring: Secondary | ICD-10-CM | POA: Diagnosis not present

## 2019-09-21 DIAGNOSIS — Z952 Presence of prosthetic heart valve: Secondary | ICD-10-CM

## 2019-09-21 LAB — POCT INR: INR: 4.2 — AB (ref 2.0–3.0)

## 2019-09-21 NOTE — Patient Instructions (Signed)
Hold warfarin tonight then decrease dose to 1 tablet daily except 1/2 tablet on Tuesdays, Thursdays and Saturdays Recheck in 3 weeks  Continue greens

## 2019-09-25 ENCOUNTER — Other Ambulatory Visit: Payer: Self-pay | Admitting: Family Medicine

## 2019-10-13 ENCOUNTER — Other Ambulatory Visit: Payer: Self-pay | Admitting: Family Medicine

## 2019-10-14 ENCOUNTER — Ambulatory Visit (INDEPENDENT_AMBULATORY_CARE_PROVIDER_SITE_OTHER): Payer: Medicare PPO | Admitting: *Deleted

## 2019-10-14 ENCOUNTER — Other Ambulatory Visit: Payer: Self-pay

## 2019-10-14 DIAGNOSIS — Z952 Presence of prosthetic heart valve: Secondary | ICD-10-CM

## 2019-10-14 DIAGNOSIS — Z5181 Encounter for therapeutic drug level monitoring: Secondary | ICD-10-CM

## 2019-10-14 LAB — POCT INR: INR: 2.9 (ref 2.0–3.0)

## 2019-10-14 NOTE — Patient Instructions (Signed)
Continue warfarin 1 tablet daily except 1/2 tablet on Tuesdays, Thursdays and Saturdays Recheck in 5 weeks  Continue greens

## 2019-11-19 ENCOUNTER — Other Ambulatory Visit: Payer: Self-pay

## 2019-11-19 ENCOUNTER — Ambulatory Visit (INDEPENDENT_AMBULATORY_CARE_PROVIDER_SITE_OTHER): Payer: Medicare PPO | Admitting: *Deleted

## 2019-11-19 DIAGNOSIS — Z5181 Encounter for therapeutic drug level monitoring: Secondary | ICD-10-CM | POA: Diagnosis not present

## 2019-11-19 DIAGNOSIS — Z952 Presence of prosthetic heart valve: Secondary | ICD-10-CM

## 2019-11-19 LAB — POCT INR: INR: 3.9 — AB (ref 2.0–3.0)

## 2019-11-19 NOTE — Patient Instructions (Signed)
Hold warfarin tonight then decrease dose to 1/2 tablet daily except 1 tablet on Mondays, Wednesdays and Fridays Recheck in 4 weeks  Continue greens

## 2019-11-27 LAB — COMPREHENSIVE METABOLIC PANEL
ALT: 17 IU/L (ref 0–32)
AST: 19 IU/L (ref 0–40)
Albumin/Globulin Ratio: 1.8 (ref 1.2–2.2)
Albumin: 4.3 g/dL (ref 3.8–4.8)
Alkaline Phosphatase: 101 IU/L (ref 44–121)
BUN/Creatinine Ratio: 20 (ref 12–28)
BUN: 21 mg/dL (ref 8–27)
Bilirubin Total: 0.6 mg/dL (ref 0.0–1.2)
CO2: 26 mmol/L (ref 20–29)
Calcium: 9.4 mg/dL (ref 8.7–10.3)
Chloride: 99 mmol/L (ref 96–106)
Creatinine, Ser: 1.07 mg/dL — ABNORMAL HIGH (ref 0.57–1.00)
GFR calc Af Amer: 63 mL/min/{1.73_m2} (ref >59)
GFR calc non Af Amer: 55 mL/min/{1.73_m2} — ABNORMAL LOW (ref >59)
Globulin, Total: 2.4 g/dL (ref 1.5–4.5)
Glucose: 334 mg/dL — ABNORMAL HIGH (ref 65–99)
Potassium: 4.7 mmol/L (ref 3.5–5.2)
Sodium: 139 mmol/L (ref 134–144)
Total Protein: 6.7 g/dL (ref 6.0–8.5)

## 2019-11-27 LAB — CBC WITH DIFFERENTIAL/PLATELET
Basophils Absolute: 0.1 10*3/uL (ref 0.0–0.2)
Basos: 1 %
EOS (ABSOLUTE): 0.2 10*3/uL (ref 0.0–0.4)
Eos: 2 %
Hematocrit: 40 % (ref 34.0–46.6)
Hemoglobin: 13.4 g/dL (ref 11.1–15.9)
Immature Grans (Abs): 0 10*3/uL (ref 0.0–0.1)
Immature Granulocytes: 0 %
Lymphocytes Absolute: 2.4 10*3/uL (ref 0.7–3.1)
Lymphs: 26 %
MCH: 29.5 pg (ref 26.6–33.0)
MCHC: 33.5 g/dL (ref 31.5–35.7)
MCV: 88 fL (ref 79–97)
Monocytes Absolute: 0.9 10*3/uL (ref 0.1–0.9)
Monocytes: 10 %
Neutrophils Absolute: 5.7 10*3/uL (ref 1.4–7.0)
Neutrophils: 61 %
Platelets: 358 10*3/uL (ref 150–450)
RBC: 4.54 x10E6/uL (ref 3.77–5.28)
RDW: 12 % (ref 11.7–15.4)
WBC: 9.4 10*3/uL (ref 3.4–10.8)

## 2019-11-27 LAB — HEMOGLOBIN A1C
Est. average glucose Bld gHb Est-mCnc: 240 mg/dL
Hgb A1c MFr Bld: 10 % — ABNORMAL HIGH (ref 4.8–5.6)

## 2019-12-08 ENCOUNTER — Ambulatory Visit (INDEPENDENT_AMBULATORY_CARE_PROVIDER_SITE_OTHER): Payer: Medicare PPO | Admitting: Family Medicine

## 2019-12-08 ENCOUNTER — Other Ambulatory Visit: Payer: Self-pay

## 2019-12-08 ENCOUNTER — Encounter: Payer: Self-pay | Admitting: Family Medicine

## 2019-12-08 VITALS — BP 130/72 | Temp 97.3°F | Ht 66.0 in | Wt 247.8 lb

## 2019-12-08 DIAGNOSIS — E782 Mixed hyperlipidemia: Secondary | ICD-10-CM | POA: Diagnosis not present

## 2019-12-08 DIAGNOSIS — I10 Essential (primary) hypertension: Secondary | ICD-10-CM

## 2019-12-08 DIAGNOSIS — E1169 Type 2 diabetes mellitus with other specified complication: Secondary | ICD-10-CM

## 2019-12-08 DIAGNOSIS — Z23 Encounter for immunization: Secondary | ICD-10-CM

## 2019-12-08 DIAGNOSIS — E119 Type 2 diabetes mellitus without complications: Secondary | ICD-10-CM

## 2019-12-08 DIAGNOSIS — G4700 Insomnia, unspecified: Secondary | ICD-10-CM | POA: Diagnosis not present

## 2019-12-08 DIAGNOSIS — E785 Hyperlipidemia, unspecified: Secondary | ICD-10-CM | POA: Insufficient documentation

## 2019-12-08 MED ORDER — ALPRAZOLAM 0.5 MG PO TABS: ORAL_TABLET | ORAL | 5 refills | Status: DC

## 2019-12-08 MED ORDER — GLIPIZIDE 5 MG PO TABS: ORAL_TABLET | ORAL | 5 refills | Status: DC

## 2019-12-08 NOTE — Patient Instructions (Signed)

## 2019-12-08 NOTE — Progress Notes (Signed)
Subjective:    Patient ID: [name], female    DOB: October 21, 1955, 64 y.o.   MRN: [id number] Very nice patient Diabetes She presents for her follow-up diabetic visit. She has type 2 diabetes mellitus. Pertinent negatives for hypoglycemia include no confusion or dizziness. Pertinent negatives for diabetes include no chest pain, no fatigue, no polydipsia, no polyphagia and no weakness. Risk factors for coronary artery disease include diabetes mellitus, dyslipidemia, hypertension and post-menopausal. Current diabetic treatment includes insulin injections and oral agent (monotherapy). She is compliant with treatment all of the time. Her weight is stable. She is following a diabetic diet.   Need for vaccination - Plan: Flu Vaccine QUAD High Dose(Fluad)  Type 2 diabetes mellitus not at goal [facility]) - Plan: Hemoglobin J6O, Basic metabolic panel  Essential hypertension - Plan: Basic metabolic panel  Primary hypertension  Mixed hyperlipidemia  Insomnia, unspecified type  The patient relates that her diet is not where it needs to be.  She is does not like to eat vegetables.  She finds most days her energy level is doing all right She does stay active within the house and with family but does not do any purposeful exercise  She does have insomnia issues.  She occasionally uses Xanax for this but tries not use it on a regular basis  She is getting along with her other medicines including her cholesterol medicine and blood pressure medicine denies any dizziness issues Review of Systems  Constitutional: Negative for activity change, appetite change and fatigue.  HENT: Negative for congestion and rhinorrhea.   Respiratory: Negative for cough and shortness of breath.   Cardiovascular: Negative for chest pain and leg swelling.  Gastrointestinal: Negative for abdominal pain and diarrhea.  Endocrine: Negative for polydipsia and polyphagia.  Skin: Negative for color change.  Neurological: Negative  for dizziness and weakness.  Psychiatric/Behavioral: Negative for behavioral problems and confusion.       Objective:   Physical Exam Vitals reviewed.  Constitutional:      General: She is not in acute distress. HENT:     Head: Normocephalic and atraumatic.  Eyes:     General:        Right eye: No discharge.        Left eye: No discharge.  Neck:     Trachea: No tracheal deviation.  Cardiovascular:     Rate and Rhythm: Normal rate and regular rhythm.     Heart sounds: Normal heart sounds. No murmur heard.   Pulmonary:     Effort: Pulmonary effort is normal. No respiratory distress.     Breath sounds: Normal breath sounds.  Lymphadenopathy:     Cervical: No cervical adenopathy.  Skin:    General: Skin is warm and dry.  Neurological:     Mental Status: She is alert.     Coordination: Coordination normal.  Psychiatric:        Behavior: Behavior normal.    I reviewed over the labs with her.  The A1c is not under good control the patient states that she does not like to check her blood sugars on a regular basis.  She feels she can do better with her diet.  We also discussed how the next step would be short acting insulin at mealtimes but this would require her to check her sugars on a regular basis either by pricking her finger or doing freestyle libre so at this point she would like to try increase glipizide to see if that would help  get the A1c under better control she will watch closely for any low sugar spells       Assessment & Plan:  1. Need for vaccination See new dose today - Flu Vaccine QUAD High Dose(Fluad)  2. Type 2 diabetes mellitus not at goal [facility]) Poor control improved diet regular activity bump up dose of glipizide 2 tablets twice daily watch for low sugar spells this was educated if any problems notify us - Hemoglobin W2H - Basic metabolic panel  3. Essential hypertension Blood pressure good control continue current measures - Basic metabolic  panel  4. Primary hypertension Minimize salt in diet  5. Mixed hyperlipidemia Cholesterol issues continue medication watch diet closely  6. Insomnia, unspecified type Insomnia use Xanax sparingly  Morbid obesity very important for patient to watch portions stay physically active Follow-up in 4 months

## 2019-12-17 ENCOUNTER — Other Ambulatory Visit: Payer: Self-pay

## 2019-12-17 ENCOUNTER — Ambulatory Visit (INDEPENDENT_AMBULATORY_CARE_PROVIDER_SITE_OTHER): Payer: Medicare PPO | Admitting: *Deleted

## 2019-12-17 DIAGNOSIS — Z952 Presence of prosthetic heart valve: Secondary | ICD-10-CM

## 2019-12-17 DIAGNOSIS — Z5181 Encounter for therapeutic drug level monitoring: Secondary | ICD-10-CM | POA: Diagnosis not present

## 2019-12-17 LAB — POCT INR: INR: 2.6 (ref 2.0–3.0)

## 2019-12-17 NOTE — Patient Instructions (Signed)
Continue warfarin 1/2 tablet daily except 1 tablet on Mondays, Wednesdays and Fridays Recheck in 4 weeks  Continue greens

## 2019-12-24 ENCOUNTER — Other Ambulatory Visit: Payer: Self-pay | Admitting: Family Medicine

## 2020-01-07 ENCOUNTER — Other Ambulatory Visit: Payer: Self-pay | Admitting: Family Medicine

## 2020-01-14 ENCOUNTER — Ambulatory Visit (INDEPENDENT_AMBULATORY_CARE_PROVIDER_SITE_OTHER): Payer: Medicare PPO | Admitting: *Deleted

## 2020-01-14 DIAGNOSIS — Z5181 Encounter for therapeutic drug level monitoring: Secondary | ICD-10-CM

## 2020-01-14 DIAGNOSIS — Z952 Presence of prosthetic heart valve: Secondary | ICD-10-CM

## 2020-01-14 LAB — POCT INR: INR: 2.5 (ref 2.0–3.0)

## 2020-01-14 NOTE — Patient Instructions (Signed)
Continue warfarin 1/2 tablet daily except 1 tablet on Mondays, Wednesdays and Fridays Recheck in 5 weeks  Continue greens

## 2020-02-17 ENCOUNTER — Ambulatory Visit (INDEPENDENT_AMBULATORY_CARE_PROVIDER_SITE_OTHER): Payer: Medicare PPO | Admitting: *Deleted

## 2020-02-17 ENCOUNTER — Other Ambulatory Visit: Payer: Self-pay | Admitting: Family Medicine

## 2020-02-17 DIAGNOSIS — Z5181 Encounter for therapeutic drug level monitoring: Secondary | ICD-10-CM | POA: Diagnosis not present

## 2020-02-17 DIAGNOSIS — Z952 Presence of prosthetic heart valve: Secondary | ICD-10-CM | POA: Diagnosis not present

## 2020-02-17 LAB — POCT INR: INR: 2.6 (ref 2.0–3.0)

## 2020-02-17 NOTE — Patient Instructions (Signed)
Continue warfarin 1/2 tablet daily except 1 tablet on Mondays, Wednesdays and Fridays Recheck in 6 weeks  Continue greens

## 2020-03-10 ENCOUNTER — Other Ambulatory Visit: Payer: Self-pay | Admitting: Family Medicine

## 2020-03-15 ENCOUNTER — Other Ambulatory Visit: Payer: Self-pay

## 2020-03-15 ENCOUNTER — Emergency Department (HOSPITAL_COMMUNITY): Payer: Medicare PPO

## 2020-03-15 ENCOUNTER — Emergency Department (HOSPITAL_COMMUNITY)
Admission: EM | Admit: 2020-03-15 | Discharge: 2020-03-15 | Disposition: A | Discharge status: Home / Self Care | Payer: Medicare PPO | Attending: Emergency Medicine | Admitting: Emergency Medicine

## 2020-03-15 ENCOUNTER — Encounter (HOSPITAL_COMMUNITY): Payer: Self-pay | Admitting: Emergency Medicine

## 2020-03-15 DIAGNOSIS — E119 Type 2 diabetes mellitus without complications: Secondary | ICD-10-CM | POA: Diagnosis not present

## 2020-03-15 DIAGNOSIS — Z79899 Other long term (current) drug therapy: Secondary | ICD-10-CM | POA: Insufficient documentation

## 2020-03-15 DIAGNOSIS — Z7982 Long term (current) use of aspirin: Secondary | ICD-10-CM | POA: Diagnosis not present

## 2020-03-15 DIAGNOSIS — I10 Essential (primary) hypertension: Secondary | ICD-10-CM | POA: Insufficient documentation

## 2020-03-15 DIAGNOSIS — Z7984 Long term (current) use of oral hypoglycemic drugs: Secondary | ICD-10-CM | POA: Diagnosis not present

## 2020-03-15 DIAGNOSIS — S61316A Laceration without foreign body of right little finger with damage to nail, initial encounter: Secondary | ICD-10-CM | POA: Insufficient documentation

## 2020-03-15 DIAGNOSIS — S6991XA Unspecified injury of right wrist, hand and finger(s), initial encounter: Secondary | ICD-10-CM | POA: Diagnosis present

## 2020-03-15 DIAGNOSIS — Z955 Presence of coronary angioplasty implant and graft: Secondary | ICD-10-CM | POA: Diagnosis not present

## 2020-03-15 DIAGNOSIS — S0990XA Unspecified injury of head, initial encounter: Secondary | ICD-10-CM | POA: Diagnosis not present

## 2020-03-15 DIAGNOSIS — M79641 Pain in right hand: Secondary | ICD-10-CM | POA: Diagnosis not present

## 2020-03-15 DIAGNOSIS — Z23 Encounter for immunization: Secondary | ICD-10-CM | POA: Insufficient documentation

## 2020-03-15 DIAGNOSIS — W19XXXA Unspecified fall, initial encounter: Secondary | ICD-10-CM

## 2020-03-15 DIAGNOSIS — Z7901 Long term (current) use of anticoagulants: Secondary | ICD-10-CM | POA: Insufficient documentation

## 2020-03-15 DIAGNOSIS — S0083XA Contusion of other part of head, initial encounter: Secondary | ICD-10-CM | POA: Diagnosis not present

## 2020-03-15 DIAGNOSIS — W228XXA Striking against or struck by other objects, initial encounter: Secondary | ICD-10-CM | POA: Insufficient documentation

## 2020-03-15 MED ORDER — LIDOCAINE HCL (PF) 1 % IJ SOLN
10.0000 mL | Freq: Once | INTRAMUSCULAR | Status: AC
  Administered 2020-03-15: 10 mL via INTRADERMAL

## 2020-03-15 MED ORDER — ACETAMINOPHEN 325 MG PO TABS
650.0000 mg | ORAL_TABLET | Freq: Once | ORAL | Status: AC
  Administered 2020-03-15: 650 mg via ORAL
  Filled 2020-03-15: qty 2

## 2020-03-15 MED ORDER — TETANUS-DIPHTH-ACELL PERTUSSIS 5-2.5-18.5 LF-MCG/0.5 IM SUSY
0.5000 mL | PREFILLED_SYRINGE | Freq: Once | INTRAMUSCULAR | Status: AC
  Administered 2020-03-15: 0.5 mL via INTRAMUSCULAR
  Filled 2020-03-15: qty 0.5

## 2020-03-15 MED ORDER — CEPHALEXIN 500 MG PO CAPS: 500.0000 mg | ORAL_CAPSULE | Freq: Two times a day (BID) | ORAL | 0 refills | Status: DC

## 2020-03-15 NOTE — Discharge Instructions (Signed)
Follow-up with Dr. Aline Brochure or your family doctor next week to get recheck on your finger.  Your stitches can come out in about 10 to 14 days.  Clean the area twice a day with soap and water and then dry

## 2020-03-15 NOTE — ED Provider Notes (Signed)
[facility] EMERGENCY DEPARTMENT Provider Note   CSN: [id number] Arrival date & time: 03/15/20  1059     History Chief Complaint  Patient presents with  . Fall    [name] is a 65 y.o. female.  Patient fell today hit her head and her right hand.  She has a laceration to her right small finger and swelling to her forehead  The history is provided by the patient. No language interpreter was used.  Fall This is a new problem. The current episode started 1 to 2 hours ago. The problem occurs rarely. The problem has been resolved. Pertinent negatives include no chest pain, no abdominal pain and no headaches. Nothing aggravates the symptoms. She has tried nothing for the symptoms. The treatment provided no relief.       Past Medical History:  Diagnosis Date  . Aortic stenosis   . Essential hypertension   . Insomnia   . Mixed hyperlipidemia   . Nocturia   . Numbness    Both feet  . Restless leg syndrome   . Ruptured disk   . Type 2 diabetes mellitus [facility])     Patient Active Problem List   Diagnosis Date Noted  . Hyperlipidemia associated with type 2 diabetes mellitus ([place]) 12/08/2019  . Encounter for screening colonoscopy 12/09/2017  . Preoperative clearance 09/30/2017  . ( 06/04/2016  . Encounter for therapeutic drug monitoring 01/12/2016  . S/P AVR 01/03/2016  . Insomnia 06/15/2015  . Thyroid nodule 09/15/2012  . Renal insufficiency 09/15/2012  . Aortic stenosis   . Type 2 diabetes mellitus not at goal [facility])   . Hypertension   . Hyperlipidemia   . Restless leg syndrome   . Ruptured disk     Past Surgical History:  Procedure Laterality Date  . AORTIC VALVE REPLACEMENT N/A 01/03/2016   Procedure: AORTIC VALVE REPLACEMENT (AVR);  Surgeon: Gaye Pollack, MD;  Location: [place];  Service: Open Heart Surgery;  Laterality: N/A;  . CARDIAC CATHETERIZATION N/A 12/16/2015   Procedure: Right/Left Heart Cath and Coronary Angiography;  Surgeon: Belva Crome, MD;   Location: [place] CV LAB;  Service: Cardiovascular;  Laterality: N/A;  . COLONOSCOPY  2007   Dr. Gala Romney: single anal papilla, otherwise unremarkable.   . COLONOSCOPY WITH PROPOFOL N/A 01/09/2018   Procedure: COLONOSCOPY WITH PROPOFOL;  Surgeon: Daneil Dolin, MD;  [facility];  Service: Endoscopy;  Laterality: N/A;  8:15am  . POLYPECTOMY  01/09/2018   Procedure: POLYPECTOMY;  Surgeon: Daneil Dolin, MD;  [facility];  Service: Endoscopy;;  rectal polyp  . TEE WITHOUT CARDIOVERSION N/A 01/03/2016   Procedure: TRANSESOPHAGEAL ECHOCARDIOGRAM (TEE);  Surgeon: Gaye Pollack, MD;  Location: [place];  Service: Open Heart Surgery;  Laterality: N/A;  . TOE SURGERY     left foot middle      OB History    Gravida  3   Para  3   Term      Preterm      AB      Living  3     SAB      IAB      Ectopic      Multiple      Live Births  3           Family History  Problem Relation Age of Onset  . Coronary artery disease Father        Diagnosed in his 24s  . Hypertension Father   .  Diabetes type II Mother   . Hypertension Mother   . Diabetes Mother   . Diabetes type II Brother   . Diabetes Brother   . Hypertension Sister   . Hyperlipidemia Sister   . Colon cancer Neg Hx     Social History   Tobacco Use  . Smoking status: Never Smoker  . Smokeless tobacco: Never Used  Vaping Use  . Vaping Use: Never used  Substance Use Topics  . Alcohol use: No  . Drug use: No    Home Medications Prior to Admission medications   Medication Sig Start Date End Date Taking? Authorizing Provider  cephALEXin (KEFLEX) 500 MG capsule Take 1 capsule (500 mg total) by mouth 2 (two) times daily. 03/15/20  Yes Milton Ferguson, MD  warfarin (COUMADIN) 5 MG tablet TAKE 1/2 TABLET BY MOUTH DAILY EXCEPT 1 TABLET ON SUNDAYS, [place], AND THURSDAYS. 08/04/19   Satira Sark, MD  acetaminophen (TYLENOL) 500 MG tablet Take 1,000 mg by mouth every 6 (six) hours as needed  for moderate pain or headache.    [provider]  ALPRAZolam (XANAX) 0.5 MG tablet TAKE 1/2 TO 1 TABLET BY MOUTH TWICE DAILY AS NEEDED. 12/08/19   Kathyrn Drown, MD  amLODipine (NORVASC) 10 MG tablet TAKE ONE TABLET BY MOUTH DAILY 02/17/20   Kathyrn Drown, MD  aspirin 81 MG tablet Take 81 mg by mouth daily.    [provider]  glipiZIDE (GLUCOTROL) 5 MG tablet TAKE TWO TABLETS BY MOUTH TWICE DAILY 12/08/19   Kathyrn Drown, MD  LEVEMIR FLEXTOUCH 100 UNIT/ML FlexPen INJECT 34 UNITS INTO THE SKIN AT BEDTIME. 02/17/20   Luking, Elayne Snare, MD  lisinopril (ZESTRIL) 40 MG tablet TAKE ONE TABLET BY MOUTH ONCE DAILY. 02/17/20   Kathyrn Drown, MD  metoprolol tartrate (LOPRESSOR) 50 MG tablet TAKE (1) TABLET BY MOUTH TWICE DAILY. 03/10/20   Kathyrn Drown, MD  mometasone (ELOCON) 0.1 % cream Apply 1 application topically 2 (two) times daily as needed (for eczema). 06/30/18   Kathyrn Drown, MD  OneTouch Delica Lancets 28B MISC USE AS DIRECTED 11/24/18   Kathyrn Drown, MD  pravastatin (PRAVACHOL) 20 MG tablet TAKE 1 TABLET BY MOUTH AT BEDTIME FOR CHOLESTEROL. 02/17/20   Kathyrn Drown, MD  SURE COMFORT PEN NEEDLES 31G X 5 MM MISC USE AS DIRECTED ONCE DAILY. 12/24/19   Kathyrn Drown, MD  terconazole (TERAZOL 7) 0.4 % vaginal cream Place 1 applicator vaginally at bedtime. Patient taking differently: Place 1 applicator vaginally at bedtime as needed (for yeast infection).  09/10/17   Estill Dooms, NP  TRULICITY 1.5 TD/1.7OH SOPN INJECT 0.5MLS INTO THE SKIN ONCE A WEEK 01/07/20   Kathyrn Drown, MD    Allergies    Patient has no known allergies.  Review of Systems   Review of Systems  Constitutional: Negative for appetite change and fatigue.  HENT: Negative for congestion, ear discharge and sinus pressure.   Eyes: Negative for discharge.  Respiratory: Negative for cough.   Cardiovascular: Negative for chest pain.  Gastrointestinal: Negative for abdominal pain and diarrhea.   Genitourinary: Negative for frequency and hematuria.  Musculoskeletal: Negative for back pain.       Laceration right wall finger  Skin: Negative for rash.  Neurological: Negative for seizures and headaches.       Fall  Psychiatric/Behavioral: Negative for hallucinations.    Physical Exam Updated Vital Signs BP (!) 149/67 (BP Location: Left Arm)  Pulse 77   Temp 97.6 F (36.4 C) (Oral)   Resp 18   Ht 5\' 6"  (1.676 m)   Wt 108.9 kg   SpO2 98%   BMI 38.74 kg/m   Physical Exam Vitals and nursing note reviewed.  Constitutional:      Appearance: She is well-developed.  HENT:     Head: Normocephalic.     Comments: Mild contusion for    Nose: Nose normal.  Eyes:     General: No scleral icterus.    Extraocular Movements: EOM normal.     Conjunctiva/sclera: Conjunctivae normal.  Neck:     Thyroid: No thyromegaly.  Cardiovascular:     Rate and Rhythm: Normal rate and regular rhythm.     Heart sounds: No murmur heard. No friction rub. No gallop.   Pulmonary:     Breath sounds: No stridor. No wheezing or rales.  Chest:     Chest wall: No tenderness.  Abdominal:     General: There is no distension.     Tenderness: There is no abdominal tenderness. There is no rebound.  Musculoskeletal:        General: No edema. Normal range of motion.     Cervical back: Neck supple.     Comments: 2 cm laceration right small finger neurovascular normal  Lymphadenopathy:     Cervical: No cervical adenopathy.  Skin:    Findings: No erythema or rash.  Neurological:     Mental Status: She is alert and oriented to person, place, and time.     Motor: No abnormal muscle tone.     Coordination: Coordination normal.  Psychiatric:        Mood and Affect: Mood and affect normal.        Behavior: Behavior normal.     ED Results / Procedures / Treatments   Labs (all labs ordered are listed, but only abnormal results are displayed) Labs Reviewed - No data to  display  EKG None  Radiology CT Head Wo Contrast  Result Date: 03/15/2020 CLINICAL DATA:  Minor head trauma. Fall this morning hitting forehead on counter. EXAM: CT HEAD WITHOUT CONTRAST TECHNIQUE: Contiguous axial images were obtained from the base of the skull through the vertex without intravenous contrast. COMPARISON:  None. FINDINGS: Brain: No evidence of acute infarction, hemorrhage, hydrocephalus, extra-axial collection or mass lesion/mass effect. Vascular: No hyperdense vessel or unexpected calcification. Skull: Normal. Negative for fracture or focal lesion. Sinuses/Orbits: No acute finding. Other: Large forehead hematoma. IMPRESSION: Large forehead hematoma without underlying acute intracranial abnormality. Electronically Signed   By: Miachel Roux M.D.   On: 03/15/2020 12:04   DG Hand Complete Right  Result Date: 03/15/2020 CLINICAL DATA:  Fall, pain EXAM: RIGHT HAND - COMPLETE 3+ VIEW COMPARISON:  None. FINDINGS: There is no evidence of fracture or dislocation. There is no evidence of arthropathy or other focal bone abnormality. Soft tissues are unremarkable. IMPRESSION: No displaced fracture or dislocation of the right hand. Electronically Signed   By: Eddie Candle M.D.   On: 03/15/2020 12:04    Procedures Procedures (including critical care time)  Medications Ordered in ED Medications  Tdap (BOOSTRIX) injection 0.5 mL (0.5 mLs Intramuscular Given 03/15/20 1235)  lidocaine (PF) (XYLOCAINE) 1 % injection 10 mL (10 mLs Intradermal Given 03/15/20 1419)  acetaminophen (TYLENOL) tablet 650 mg (650 mg Oral Given 03/15/20 1418)    ED Course  I have reviewed the triage vital signs and the nursing notes.  Pertinent labs & imaging results  that were available during my care of the patient were reviewed by me and considered in my medical decision making (see chart for details). Laceration sutured by my physician assistant.  2 cm laceration closed without problem   MDM  Rules/Calculators/A&P                          CT scan of the head negative.  X-ray of the right hand negative.  Patient has contusion to forehead and laceration to right small finger.  She will follow-up with her primary care doctor or orthopedics.  She is given Keflex Final Clinical Impression(s) / ED Diagnoses Final diagnoses:  Fall, initial encounter    Rx / DC Orders ED Discharge Orders         Ordered    cephALEXin (KEFLEX) 500 MG capsule  2 times daily        03/15/20 1446           Milton Ferguson, MD 03/19/20 1013

## 2020-03-15 NOTE — ED Triage Notes (Signed)
Pt c/o of a fall this morning where she hit her head on the Linoleum and air vent.  Pt is on Blood thinners but denies LOC.  Pt has a laceration to her right little finger with bleeding controlled.

## 2020-03-15 NOTE — ED Provider Notes (Signed)
LACERATION REPAIR Performed by: Emeline Darling Authorized by: Emeline Darling Consent: Verbal consent obtained. Risks and benefits: risks, benefits and alternatives were discussed Consent given by: patient Patient identity confirmed: provided demographic data Prepped and Draped in normal sterile fashion Wound explored  Laceration Location: right fifth finger  Laceration Length: 3 cm  No Foreign Bodies seen or palpated  Anesthesia: Digital block  Local anesthetic: lidocaine 1% without epinephrine  Anesthetic total: 3 ml  Irrigation method: syringe Amount of cleaning: 400cc, sterile saline  Skin closure: Close approximation  Number of sutures: 6 Technique: simple interrupted, 4-0 prolene  Patient tolerance: Patient tolerated the procedure well with no immediate complications.    [name] 03/15/20 Blanchard, Joseph, MD 03/18/20 [phone]

## 2020-03-24 ENCOUNTER — Encounter: Payer: Self-pay | Admitting: Family Medicine

## 2020-03-24 ENCOUNTER — Ambulatory Visit (INDEPENDENT_AMBULATORY_CARE_PROVIDER_SITE_OTHER): Payer: Medicare PPO | Admitting: Family Medicine

## 2020-03-24 ENCOUNTER — Other Ambulatory Visit: Payer: Self-pay

## 2020-03-24 VITALS — BP 130/82 | HR 78 | Temp 95.4°F | Ht 66.0 in | Wt 249.4 lb

## 2020-03-24 DIAGNOSIS — S61216A Laceration without foreign body of right little finger without damage to nail, initial encounter: Secondary | ICD-10-CM | POA: Insufficient documentation

## 2020-03-24 DIAGNOSIS — Z4802 Encounter for removal of sutures: Secondary | ICD-10-CM

## 2020-03-24 DIAGNOSIS — S61216D Laceration without foreign body of right little finger without damage to nail, subsequent encounter: Secondary | ICD-10-CM

## 2020-03-24 NOTE — Progress Notes (Signed)
Pt here to have stitches looked at. Pt has had stitches in right pinky finger for 9 days. Pt had a fall last Tuesday. Pt had 6 stitches put in. Pt states she was told stitches could come out early next week. Area is tender and swollen some.     Patient ID: [name], female    DOB: Feb 04, 1956, 65 y.o.   MRN: [id number]   Chief Complaint  Patient presents with  . Suture / Staple Removal   Subjective:  CC:  Recheck right little finger sutures  This is not a new problem. Presents for follow-up for a fall that occurred on 03/15/20. Fell from standing position onto face. Hit forehead and cut right pinky finger during the fall. No LOC. Went to ED. Head CT negative and x-ray of right hand- negative for fracture. Has 6 sutures in right little finger. Denies fever and chills. No purulence, some swelling, able to bend finger.     Medical History [name] has a past medical history of Aortic stenosis, Essential hypertension, Insomnia, Mixed hyperlipidemia, Nocturia, Numbness, Restless leg syndrome, Ruptured disk, and Type 2 diabetes mellitus ([place]).   Outpatient Encounter Medications as of 03/24/2020  Medication Sig  . acetaminophen (TYLENOL) 500 MG tablet Take 1,000 mg by mouth every 6 (six) hours as needed for moderate pain or headache.  . ALPRAZolam (XANAX) 0.5 MG tablet TAKE 1/2 TO 1 TABLET BY MOUTH TWICE DAILY AS NEEDED.  Marland Kitchen amLODipine (NORVASC) 10 MG tablet TAKE ONE TABLET BY MOUTH DAILY  . aspirin 81 MG tablet Take 81 mg by mouth daily.  Marland Kitchen glipiZIDE (GLUCOTROL) 5 MG tablet TAKE TWO TABLETS BY MOUTH TWICE DAILY  . LEVEMIR FLEXTOUCH 100 UNIT/ML FlexPen INJECT 34 UNITS INTO THE SKIN AT BEDTIME.  Marland Kitchen lisinopril (ZESTRIL) 40 MG tablet TAKE ONE TABLET BY MOUTH ONCE DAILY.  . metoprolol tartrate (LOPRESSOR) 50 MG tablet TAKE (1) TABLET BY MOUTH TWICE DAILY.  . mometasone (ELOCON) 0.1 % cream Apply 1 application topically 2 (two) times daily as needed (for eczema).  [name] Lancets 76H MISC  USE AS DIRECTED  . pravastatin (PRAVACHOL) 20 MG tablet TAKE 1 TABLET BY MOUTH AT BEDTIME FOR CHOLESTEROL.  . SURE COMFORT PEN NEEDLES 31G X 5 MM MISC USE AS DIRECTED ONCE DAILY.  Marland Kitchen terconazole (TERAZOL 7) 0.4 % vaginal cream Place 1 applicator vaginally at bedtime. (Patient taking differently: Place 1 applicator vaginally at bedtime as needed (for yeast infection).)  . TRULICITY 1.5 YW/7.3XT SOPN INJECT 0.5MLS INTO THE SKIN ONCE A WEEK  . warfarin (COUMADIN) 5 MG tablet TAKE 1/2 TABLET BY MOUTH DAILY EXCEPT 1 TABLET ON SUNDAYS, TUESDAYS, AND THURSDAYS.  . [DISCONTINUED] cephALEXin (KEFLEX) 500 MG capsule Take 1 capsule (500 mg total) by mouth 2 (two) times daily.   No facility-administered encounter medications on file as of 03/24/2020.     Review of Systems  Constitutional: Negative for chills and fever.  HENT: Negative for congestion.   Eyes: Negative for pain and visual disturbance.       Ecchymosis around eyes and forehead and face (no bruising around mouth).  Respiratory: Negative for cough and shortness of breath.   Cardiovascular: Negative for chest pain.  Gastrointestinal: Negative for abdominal pain.  Musculoskeletal: Negative for back pain and neck pain.  Skin: Positive for color change and wound.       bruising on forehead and face minus around mouth.  Neurological: Positive for dizziness. Negative for weakness, light-headedness and headaches.  Dizziness when laying down at night- room spinning, last 5-10 seconds- resolves quickly.   Psychiatric/Behavioral: Negative for confusion and decreased concentration.     Vitals BP 130/82   Pulse 78   Temp (!) 95.4 F (35.2 C)   Ht 5\' 6"  (1.676 m)   Wt 249 lb 6.4 oz (113.1 kg)   SpO2 99%   BMI 40.25 kg/m   Objective:   Physical Exam Vitals reviewed.  Constitutional:      Appearance: Normal appearance.  Cardiovascular:     Rate and Rhythm: Normal rate and regular rhythm.     Heart sounds: Normal heart sounds.   Pulmonary:     Effort: Pulmonary effort is normal.     Breath sounds: Normal breath sounds.     Comments: "click" from valve replacement. Musculoskeletal:     Right hand: Laceration and tenderness present. Normal capillary refill.     Comments: Right pinky finger with sutures present. No evidence of purulence. Tender to touch. Healing.  Skin:    General: Skin is warm and dry.  Neurological:     General: No focal deficit present.     Mental Status: She is alert.  Psychiatric:        Behavior: Behavior normal.      Assessment and Plan   1. Laceration of right little finger, foreign body presence unspecified, nail damage status unspecified, subsequent encounter   Use antibacterial soap to clean finger (has been using bath and body works soap). Return Monday for suture removal.  Agrees with plan of care discussed today. Understands warning signs to seek further care: chest pain, shortness of breath, any significant change in health, pus, redness, swelling of little finger Understands to follow-up Monday for suture removal.     Chalmers Guest, NP 03/24/2020

## 2020-03-24 NOTE — Patient Instructions (Signed)
Laceration Care, Adult A laceration is a cut that may go through all layers of the skin. The cut may also go into the tissue that is right under the skin. Some cuts heal on their own. Others need to be closed with stitches (sutures), staples, skin adhesive strips, or skin glue. Taking care of your injury lowers your risk of infection, helps your injury to heal better, and may prevent scarring. Supplies needed:  Soap.  Water.  Hand sanitizer.  Bandage (dressing).  Antibiotic ointment.  Clean towel. How to take care of your cut Wash your hands with soap and water before touching your wound or changing your bandage. If soap and water are not available, use hand sanitizer. If your doctor used stitches or staples:  Keep the wound clean and dry.  If you were given a bandage, change it at least once a day as told by your doctor. You should also change it if it gets wet or dirty.  Keep the wound completely dry for the first 24 hours, or as told by your doctor. After that, you may take a shower or a bath. Do not get the wound soaked in water until after the stitches or staples have been removed.  Clean the wound once a day, or as told by your doctor: ? Wash the wound with soap and water. ? Rinse the wound with water to remove all soap. ? Pat the wound dry with a clean towel. Do not rub the wound.  After you clean the wound, put a thin layer of antibiotic ointment on it as told by your doctor. This ointment: ? Helps to prevent infection. ? Keeps the bandage from sticking to the wound.  Have your stitches or staples removed as told by your doctor. If your doctor used skin adhesive strips:  Keep the wound clean and dry.  If you were given a bandage, you should change it at least once a day as told by your doctor. You should also change it if it gets wet or dirty.  Do not get the skin adhesive strips wet. You can take a shower or a bath, but keep the wound dry.  If the wound gets wet,  pat it dry with a clean towel. Do not rub the wound.  Skin adhesive strips fall off on their own. You can trim the strips as the wound heals. Do not remove any strips that are still stuck to the wound. They will fall off after a while. If your doctor used skin glue:  Try to keep your wound dry, but you may briefly wet it in the shower or bath. Do not soak the wound in water, such as by swimming.  After you take a shower or a bath, gently pat the wound dry with a clean towel. Do not rub the wound.  Do not do any activities that will make you really sweaty until the skin glue has fallen off on its own.  Do not apply liquid, cream, or ointment medicine to your wound while the skin glue is still on.  If you were given a bandage, you should change it at least once a day or as told by your doctor. You should also change it if it gets dirty or wet.  If a bandage is placed over the wound, do not let the tape touch the skin glue.  Do not pick at the glue. The skin glue usually stays on for 5-10 days. Then, it falls off the skin. General   instructions  Take over-the-counter and prescription medicines only as told by your doctor.  If you were given antibiotic medicine or ointment, take or apply it as told by your doctor. Do not stop using it even if your condition improves.  Do not scratch or pick at the wound.  Check your wound every day for signs of infection. Watch for: ? Redness, swelling, or pain. ? Fluid, blood, or pus.  Raise (elevate) the injured area above the level of your heart while you are sitting or lying down.  If directed, put ice on the affected area: ? Put ice in a plastic bag. ? Place a towel between your skin and the bag. ? Leave the ice on for 20 minutes, 2-3 times a day.  Prevent scarring by covering your wound with sunscreen of at least 30 SPF whenever you are outside after your wound has healed.  Keep all follow-up visits as told by your doctor. This is important.    Get help if:  You got a tetanus shot and you have any of these problems at the injection site: ? Swelling. ? Very bad pain. ? Redness. ? Bleeding.  You have a fever.  A wound that was closed breaks open.  You notice a bad smell coming from your wound or your bandage.  You notice something coming out of the wound, such as wood or glass.  Medicine does not relieve your pain.  You have more redness, swelling, or pain at the site of your wound.  You have fluid, blood, or pus coming from your wound.  You notice a change in the color of your skin near your wound.  You need to change the bandage often because fluid, blood, or pus is coming from the wound.  You start to have a new rash.  You start to have numbness around the wound. Get help right away if:  You have very bad swelling around the wound.  Your pain suddenly gets worse and is very bad.  You notice painful lumps near the wound or anywhere on your body.  You have a red streak going away from your wound.  The wound is on your hand or foot, and: ? You cannot move a finger or toe. ? Your fingers or toes look pale or bluish. Summary  A laceration is a cut that may go through all layers of the skin. The cut may also go into the tissue right under the skin.  Some cuts heal on their own. Others need to be closed with stitches, staples, skin adhesive strips, or skin glue.  Follow your doctor's instructions for caring for your cut. Proper care of a cut lowers the risk of infection, helps the cut heal better, and prevents scarring. This information is not intended to replace advice given to you by your health care provider. Make sure you discuss any questions you have with your health care provider. Document Revised: 04/19/2017 Document Reviewed: 03/11/2017 Elsevier Patient Education  2021 [employer]  

## 2020-03-28 ENCOUNTER — Other Ambulatory Visit: Payer: Self-pay

## 2020-03-28 ENCOUNTER — Encounter: Payer: Self-pay | Admitting: Family Medicine

## 2020-03-28 ENCOUNTER — Ambulatory Visit (INDEPENDENT_AMBULATORY_CARE_PROVIDER_SITE_OTHER): Payer: Medicare PPO | Admitting: Family Medicine

## 2020-03-28 VITALS — BP 124/70 | HR 118 | Temp 97.3°F | Ht 65.0 in | Wt 247.0 lb

## 2020-03-28 DIAGNOSIS — Z4802 Encounter for removal of sutures: Secondary | ICD-10-CM | POA: Insufficient documentation

## 2020-03-28 NOTE — Patient Instructions (Signed)
Suture Removal, Care After This sheet gives you information about how to care for yourself after your procedure. Your health care provider may also give you more specific instructions. If you have problems or questions, contact your health care provider. What can I expect after the procedure? After your stitches (sutures) are removed, it is common to have:  Some discomfort and swelling in the area.  Slight redness in the area. Follow these instructions at home: If you have a bandage:  Wash your hands with soap and water before you change your bandage (dressing). If soap and water are not available, use hand sanitizer.  Change your dressing as told by your health care provider. If your dressing becomes wet or dirty, or develops a bad smell, change it as soon as possible.  If your dressing sticks to your skin, soak it in warm water to loosen it. Wound care  Check your wound every day for signs of infection. Check for: ? More redness, swelling, or pain. ? Fluid or blood. ? Warmth. ? Pus or a bad smell.  Wash your hands with soap and water before and after touching your wound.  Apply cream or ointment only as directed by your health care provider. If you are using cream or ointment, wash the area with soap and water 2 times a day to remove all the cream or ointment. Rinse off the soap and pat the area dry with a clean towel.  If you have skin glue or adhesive strips on your wound, leave these closures in place. They may need to stay in place for 2 weeks or longer. If adhesive strip edges start to loosen and curl up, you may trim the loose edges. Do not remove adhesive strips completely unless your health care provider tells you to do that.  Keep the wound area dry and clean. Do not take baths, swim, or use a hot tub until your health care provider approves.  Continue to protect the wound from injury.  Do not pick at your wound. Picking can cause an infection.  When your wound has  completely healed, wear sunscreen over it or cover it with clothing when you are outside. New scars get sunburned easily, which can make scarring worse.   General instructions  Take over-the-counter and prescription medicines only as told by your health care provider.  Keep all follow-up visits as told by your health care provider. This is important. Contact a health care provider if:  You have redness, swelling, or pain around your wound.  You have fluid or blood coming from your wound.  Your wound feels warm to the touch.  You have pus or a bad smell coming from your wound.  Your wound opens up. Get help right away if:  You have a fever.  You have redness that is spreading from your wound. Summary  After your sutures are removed, it is common to have some discomfort and swelling in the area.  Wash your hands with soap and water before you change your bandage (dressing).  Keep the wound area dry and clean. Do not take baths, swim, or use a hot tub until your health care provider approves. This information is not intended to replace advice given to you by your health care provider. Make sure you discuss any questions you have with your health care provider. Document Revised: 12/18/2019 Document Reviewed: 12/18/2019 Elsevier Patient Education  2021 [employer].

## 2020-03-28 NOTE — Progress Notes (Signed)
Patient ID: [name], female    DOB: [birth year], 65 y.o.   MRN: [id number]   Chief Complaint  Patient presents with  . Suture / Staple Removal   Subjective:  CC: suture removal, right little finger  This is not a new problem.  Presents today for suture removal to the right little finger.  [name] on March 15, 2020 fell on face, sustained laceration of the right little finger, where 6 sutures were placed via the ED.  Denies LOC.  She is on Coumadin, facial ecchymosis is improving.  Denies fever, chills, chest pain, shortness of breath, no purulence from right little finger.  There is a small area that is "raw" and tender to touch.    pt arrives for suture removal. Had a fall on 1/11 and went to ED. Had sutures put in right little finger.   Medical History [name] has a past medical history of Aortic stenosis, Essential hypertension, Insomnia, Mixed hyperlipidemia, Nocturia, Numbness, Restless leg syndrome, Ruptured disk, and Type 2 diabetes mellitus ([place]).   Outpatient Encounter Medications as of 03/28/2020  Medication Sig  . acetaminophen (TYLENOL) 500 MG tablet Take 1,000 mg by mouth every 6 (six) hours as needed for moderate pain or headache.  . ALPRAZolam (XANAX) 0.5 MG tablet TAKE 1/2 TO 1 TABLET BY MOUTH TWICE DAILY AS NEEDED.  Marland Kitchen amLODipine (NORVASC) 10 MG tablet TAKE ONE TABLET BY MOUTH DAILY  . aspirin 81 MG tablet Take 81 mg by mouth daily.  Marland Kitchen glipiZIDE (GLUCOTROL) 5 MG tablet TAKE TWO TABLETS BY MOUTH TWICE DAILY  . LEVEMIR FLEXTOUCH 100 UNIT/ML FlexPen INJECT 34 UNITS INTO THE SKIN AT BEDTIME.  Marland Kitchen lisinopril (ZESTRIL) 40 MG tablet TAKE ONE TABLET BY MOUTH ONCE DAILY.  . metoprolol tartrate (LOPRESSOR) 50 MG tablet TAKE (1) TABLET BY MOUTH TWICE DAILY.  . mometasone (ELOCON) 0.1 % cream Apply 1 application topically 2 (two) times daily as needed (for eczema).  [name] Delica Lancets 29J MISC USE AS DIRECTED  . pravastatin (PRAVACHOL) 20 MG tablet TAKE 1 TABLET BY MOUTH  AT BEDTIME FOR CHOLESTEROL.  . SURE COMFORT PEN NEEDLES 31G X 5 MM MISC USE AS DIRECTED ONCE DAILY.  Marland Kitchen terconazole (TERAZOL 7) 0.4 % vaginal cream Place 1 applicator vaginally at bedtime. (Patient taking differently: Place 1 applicator vaginally at bedtime as needed (for yeast infection).)  . TRULICITY 1.5 JO/8.4ZY SOPN INJECT 0.5MLS INTO THE SKIN ONCE A WEEK  . warfarin (COUMADIN) 5 MG tablet TAKE 1/2 TABLET BY MOUTH DAILY EXCEPT 1 TABLET ON SUNDAYS, TUESDAYS, AND THURSDAYS.   No facility-administered encounter medications on file as of 03/28/2020.     Review of Systems  Constitutional: Negative for chills and fever.  HENT: Negative for congestion.   Respiratory: Negative for cough and shortness of breath.   Cardiovascular: Negative for chest pain.  Gastrointestinal: Negative for abdominal pain.  Skin: Positive for color change and wound.       Right little finger laceration on 03/15/20. Here for suture removal. No purulence.   Bruising from fall, healing.   Neurological: Negative for dizziness, light-headedness and headaches.     Vitals BP 124/70   Pulse (!) 118   Temp (!) 97.3 F (36.3 C)   Ht 5\' 5"  (1.651 m)   Wt 247 lb (112 kg)   SpO2 98%   BMI 41.10 kg/m   Objective:   Physical Exam Vitals reviewed.  Constitutional:      General: She is not in acute distress.  Appearance: Normal appearance.  Cardiovascular:     Rate and Rhythm: Normal rate and regular rhythm.     Heart sounds: Normal heart sounds.     Comments: Click (valve replacement) on coumadin Pulmonary:     Effort: Pulmonary effort is normal.     Breath sounds: Normal breath sounds.  Skin:    General: Skin is warm and dry.     Findings: Bruising present.     Comments: Right little finger sutures removed (six total sutures). Small area looks "raw" . No purulence.  Facial bruising healing (on Coumadin).   Neurological:     General: No focal deficit present.     Mental Status: She is alert.   Psychiatric:        Behavior: Behavior normal.      Assessment and Plan   1. Visit for suture removal   6 sutures removed from right little finger without incident. Tolerated this very well.  She will continue to keep an eye on this finger, protect it with gauze for the next few days.  She will keep it clean.  Agrees with plan of care discussed today. Understands warning signs to seek further care: chest pain, shortness of breath, any significant change in health.  Understands to follow-up if purulence, erythema, swelling of finger or any other issue.  She will keep an eye and let us know.    Tina Ades, NP  03/28/2020

## 2020-03-30 ENCOUNTER — Other Ambulatory Visit: Payer: Self-pay

## 2020-03-30 ENCOUNTER — Ambulatory Visit (INDEPENDENT_AMBULATORY_CARE_PROVIDER_SITE_OTHER): Payer: Medicare PPO | Admitting: *Deleted

## 2020-03-30 DIAGNOSIS — Z952 Presence of prosthetic heart valve: Secondary | ICD-10-CM | POA: Diagnosis not present

## 2020-03-30 DIAGNOSIS — Z5181 Encounter for therapeutic drug level monitoring: Secondary | ICD-10-CM | POA: Diagnosis not present

## 2020-03-30 LAB — POCT INR: INR: 3 (ref 2.0–3.0)

## 2020-03-30 NOTE — Patient Instructions (Signed)
Continue warfarin 1/2 tablet daily except 1 tablet on Mondays, Wednesdays and Fridays Recheck in 6 weeks  Continue greens

## 2020-04-11 ENCOUNTER — Telehealth (INDEPENDENT_AMBULATORY_CARE_PROVIDER_SITE_OTHER): Payer: Medicare PPO | Admitting: Family Medicine

## 2020-04-11 ENCOUNTER — Other Ambulatory Visit: Payer: Self-pay

## 2020-04-11 DIAGNOSIS — E1169 Type 2 diabetes mellitus with other specified complication: Secondary | ICD-10-CM | POA: Diagnosis not present

## 2020-04-11 DIAGNOSIS — I10 Essential (primary) hypertension: Secondary | ICD-10-CM

## 2020-04-11 DIAGNOSIS — E782 Mixed hyperlipidemia: Secondary | ICD-10-CM

## 2020-04-11 DIAGNOSIS — E785 Hyperlipidemia, unspecified: Secondary | ICD-10-CM

## 2020-04-11 MED ORDER — TRULICITY 1.5 MG/0.5ML ~~LOC~~ SOAJ: SUBCUTANEOUS | 5 refills | Status: DC

## 2020-04-11 MED ORDER — METOPROLOL TARTRATE 50 MG PO TABS: ORAL_TABLET | ORAL | 5 refills | Status: DC

## 2020-04-11 MED ORDER — AMLODIPINE BESYLATE 10 MG PO TABS: 10.0000 mg | ORAL_TABLET | Freq: Every day | ORAL | 5 refills | Status: DC

## 2020-04-11 MED ORDER — ALPRAZOLAM 0.5 MG PO TABS: ORAL_TABLET | ORAL | 5 refills | Status: DC

## 2020-04-11 MED ORDER — LEVEMIR FLEXTOUCH 100 UNIT/ML ~~LOC~~ SOPN
PEN_INJECTOR | SUBCUTANEOUS | 5 refills | Status: DC
Start: 2020-04-11 — End: 2020-06-13

## 2020-04-11 MED ORDER — PRAVASTATIN SODIUM 20 MG PO TABS: ORAL_TABLET | ORAL | 5 refills | Status: DC

## 2020-04-11 MED ORDER — LISINOPRIL 40 MG PO TABS: 40.0000 mg | ORAL_TABLET | Freq: Every day | ORAL | 5 refills | Status: DC

## 2020-04-11 MED ORDER — GLIPIZIDE 5 MG PO TABS: ORAL_TABLET | ORAL | 5 refills | Status: DC

## 2020-04-11 NOTE — Progress Notes (Signed)
Subjective:    Patient ID: [name], female    DOB: 02/06/1956, 65 y.o.   MRN: [id number]  HPImed check up. Pt states sugar is running good. Takes meds every day. No concerns today.  States she takes her diabetes medicine regular basis denies any low spells tries watch her diet try to stay active  She feels her blood pressure is doing well but has not checked it recently.  Taking her cholesterol medication on a regular basis.  Her overall energy level doing well  She lost her husband several months back she denies being depressed currently  She did have 1 fall but she is doing better now  Fall Risk  03/28/2020 12/08/2019 08/27/2019 12/26/2018 06/15/2015  Falls in the past year? 1 - 0 0 No  Number falls in past yr: 0 - - - -  Injury with Fall? 1 - - - -  Risk for fall due to : History of fall(s) - - - -  Follow up Education provided;Falls evaluation completed Falls evaluation completed Falls evaluation completed Falls evaluation completed -    Virtual Visit via Telephone Note  I connected with SHREE ESPEY on 04/11/20 at  8:40 AM EST by telephone and verified that I am speaking with the correct person using two identifiers.  Location: Patient: home Provider: office   I discussed the limitations, risks, security and privacy concerns of performing an evaluation and management service by telephone and the availability of in person appointments. I also discussed with the patient that there may be a patient responsible charge related to this service. The patient expressed understanding and agreed to proceed.   History of Present Illness:    Observations/Objective:   Assessment and Plan:   Follow Up Instructions:    I discussed the assessment and treatment plan with the patient. The patient was provided an opportunity to ask questions and all were answered. The patient agreed with the plan and demonstrated an understanding of the instructions.   The patient was advised  to call back or seek an in-person evaluation if the symptoms worsen or if the condition fails to improve as anticipated.  I provided 20 minutes of non-face-to-face time during this encounter.      Review of Systems  Constitutional: Negative for activity change and appetite change.  HENT: Negative for congestion and rhinorrhea.   Respiratory: Negative for cough and shortness of breath.   Cardiovascular: Negative for chest pain and leg swelling.  Gastrointestinal: Negative for abdominal pain, nausea and vomiting.  Skin: Negative for color change.  Neurological: Negative for dizziness and weakness.  Psychiatric/Behavioral: Negative for agitation and confusion.       Objective:   Physical Exam  Today's visit was via telephone Physical exam was not possible for this visit  In person exam today was not possible this had to be a phone visit because of inclement weather.  Patient technology does not allow for her to do video visit    Assessment & Plan:  Patient had a recent fall sustaining large hematoma of the head which is gone down gone away denied any concussion syndrome  Laceration of the finger is healed up function of the finger is fine  Diabetes she feels is under good control with readings in the 130-150 range in the morning and a little bit higher later in the day she is willing to do blood work  Hyperlipidemia associated with diabetes she takes her medicine on a regular basis watches diet  she will check lab work  Blood pressure she feels its been doing good but she has not checked it recently she is open to getting it checked she states she will find a blood pressure cuff and check the readings and let us know  She will do blood work this month follow-up in June

## 2020-04-26 LAB — HM DIABETES EYE EXAM

## 2020-05-04 DIAGNOSIS — E119 Type 2 diabetes mellitus without complications: Secondary | ICD-10-CM | POA: Diagnosis not present

## 2020-05-04 DIAGNOSIS — I10 Essential (primary) hypertension: Secondary | ICD-10-CM | POA: Diagnosis not present

## 2020-05-05 LAB — BASIC METABOLIC PANEL
BUN/Creatinine Ratio: 16 (ref 12–28)
BUN: 17 mg/dL (ref 8–27)
CO2: 22 mmol/L (ref 20–29)
Calcium: 8.6 mg/dL — ABNORMAL LOW (ref 8.7–10.3)
Chloride: 102 mmol/L (ref 96–106)
Creatinine, Ser: 1.04 mg/dL — ABNORMAL HIGH (ref 0.57–1.00)
Glucose: 270 mg/dL — ABNORMAL HIGH (ref 65–99)
Potassium: 4.7 mmol/L (ref 3.5–5.2)
Sodium: 141 mmol/L (ref 134–144)
eGFR: 59 mL/min/{1.73_m2} — ABNORMAL LOW (ref >59)

## 2020-05-05 LAB — HEMOGLOBIN A1C
Est. average glucose Bld gHb Est-mCnc: 237 mg/dL
Hgb A1c MFr Bld: 9.9 % — ABNORMAL HIGH (ref 4.8–5.6)

## 2020-05-11 ENCOUNTER — Ambulatory Visit (INDEPENDENT_AMBULATORY_CARE_PROVIDER_SITE_OTHER): Payer: Medicare PPO | Admitting: *Deleted

## 2020-05-11 ENCOUNTER — Other Ambulatory Visit: Payer: Self-pay | Admitting: Family Medicine

## 2020-05-11 DIAGNOSIS — Z5181 Encounter for therapeutic drug level monitoring: Secondary | ICD-10-CM | POA: Diagnosis not present

## 2020-05-11 DIAGNOSIS — Z952 Presence of prosthetic heart valve: Secondary | ICD-10-CM | POA: Diagnosis not present

## 2020-05-11 LAB — POCT INR: INR: 2.1 (ref 2.0–3.0)

## 2020-05-11 NOTE — Patient Instructions (Signed)
Continue warfarin 1/2 tablet daily except 1 tablet on Mondays, Wednesdays and Fridays Recheck in 6 weeks  Continue greens

## 2020-05-30 ENCOUNTER — Encounter: Payer: Self-pay | Admitting: Family Medicine

## 2020-06-10 ENCOUNTER — Other Ambulatory Visit: Payer: Self-pay | Admitting: Family Medicine

## 2020-06-22 ENCOUNTER — Ambulatory Visit (INDEPENDENT_AMBULATORY_CARE_PROVIDER_SITE_OTHER): Payer: Medicare PPO | Admitting: *Deleted

## 2020-06-22 DIAGNOSIS — Z952 Presence of prosthetic heart valve: Secondary | ICD-10-CM

## 2020-06-22 DIAGNOSIS — Z5181 Encounter for therapeutic drug level monitoring: Secondary | ICD-10-CM | POA: Diagnosis not present

## 2020-06-22 LAB — POCT INR: INR: 2.3 (ref 2.0–3.0)

## 2020-06-22 NOTE — Patient Instructions (Signed)
Continue warfarin 1/2 tablet daily except 1 tablet on Mondays, Wednesdays and Fridays Recheck in 6 weeks  Continue greens

## 2020-07-04 ENCOUNTER — Other Ambulatory Visit (HOSPITAL_COMMUNITY): Payer: Self-pay | Admitting: Family Medicine

## 2020-07-04 DIAGNOSIS — Z1231 Encounter for screening mammogram for malignant neoplasm of breast: Secondary | ICD-10-CM

## 2020-07-09 ENCOUNTER — Other Ambulatory Visit: Payer: Self-pay | Admitting: Family Medicine

## 2020-07-13 ENCOUNTER — Ambulatory Visit (HOSPITAL_COMMUNITY)
Admission: RE | Admit: 2020-07-13 | Discharge: 2020-07-13 | Disposition: A | Discharge status: Home / Self Care | Payer: Medicare PPO | Source: Ambulatory Visit | Attending: Family Medicine | Admitting: Family Medicine

## 2020-07-13 DIAGNOSIS — Z1231 Encounter for screening mammogram for malignant neoplasm of breast: Secondary | ICD-10-CM | POA: Diagnosis not present

## 2020-07-22 ENCOUNTER — Other Ambulatory Visit: Payer: Self-pay | Admitting: Family Medicine

## 2020-08-03 ENCOUNTER — Ambulatory Visit (INDEPENDENT_AMBULATORY_CARE_PROVIDER_SITE_OTHER): Payer: Medicare PPO | Admitting: *Deleted

## 2020-08-03 DIAGNOSIS — Z952 Presence of prosthetic heart valve: Secondary | ICD-10-CM | POA: Diagnosis not present

## 2020-08-03 DIAGNOSIS — Z5181 Encounter for therapeutic drug level monitoring: Secondary | ICD-10-CM | POA: Diagnosis not present

## 2020-08-03 LAB — POCT INR: INR: 3 (ref 2.0–3.0)

## 2020-08-03 NOTE — Patient Instructions (Signed)
Continue warfarin 1/2 tablet daily except 1 tablet on Mondays, Wednesdays and Fridays Recheck in 6 weeks  Continue greens

## 2020-08-22 ENCOUNTER — Other Ambulatory Visit: Payer: Self-pay | Admitting: Family Medicine

## 2020-08-25 ENCOUNTER — Other Ambulatory Visit: Payer: Self-pay | Admitting: Cardiology

## 2020-09-09 ENCOUNTER — Other Ambulatory Visit: Payer: Self-pay

## 2020-09-09 ENCOUNTER — Ambulatory Visit (INDEPENDENT_AMBULATORY_CARE_PROVIDER_SITE_OTHER): Payer: Medicare PPO | Admitting: Family Medicine

## 2020-09-09 VITALS — BP 124/72 | Temp 93.6°F | Wt 249.4 lb

## 2020-09-09 DIAGNOSIS — E119 Type 2 diabetes mellitus without complications: Secondary | ICD-10-CM | POA: Diagnosis not present

## 2020-09-09 DIAGNOSIS — E785 Hyperlipidemia, unspecified: Secondary | ICD-10-CM | POA: Diagnosis not present

## 2020-09-09 DIAGNOSIS — Z794 Long term (current) use of insulin: Secondary | ICD-10-CM

## 2020-09-09 DIAGNOSIS — E1140 Type 2 diabetes mellitus with diabetic neuropathy, unspecified: Secondary | ICD-10-CM

## 2020-09-09 DIAGNOSIS — I10 Essential (primary) hypertension: Secondary | ICD-10-CM

## 2020-09-09 DIAGNOSIS — Z79899 Other long term (current) drug therapy: Secondary | ICD-10-CM | POA: Diagnosis not present

## 2020-09-09 DIAGNOSIS — E1169 Type 2 diabetes mellitus with other specified complication: Secondary | ICD-10-CM

## 2020-09-09 MED ORDER — LEVEMIR FLEXTOUCH 100 UNIT/ML ~~LOC~~ SOPN: PEN_INJECTOR | SUBCUTANEOUS | 6 refills | Status: DC

## 2020-09-09 MED ORDER — ALPRAZOLAM 0.5 MG PO TABS: ORAL_TABLET | ORAL | 2 refills | Status: DC

## 2020-09-09 MED ORDER — LISINOPRIL 40 MG PO TABS: 40.0000 mg | ORAL_TABLET | Freq: Every day | ORAL | 1 refills | Status: DC

## 2020-09-09 MED ORDER — TRULICITY 1.5 MG/0.5ML ~~LOC~~ SOAJ: SUBCUTANEOUS | 1 refills | Status: DC

## 2020-09-09 MED ORDER — GLIPIZIDE 5 MG PO TABS: ORAL_TABLET | ORAL | 1 refills | Status: DC

## 2020-09-09 MED ORDER — METOPROLOL TARTRATE 50 MG PO TABS: ORAL_TABLET | ORAL | 1 refills | Status: DC

## 2020-09-09 MED ORDER — PRAVASTATIN SODIUM 20 MG PO TABS: ORAL_TABLET | ORAL | 1 refills | Status: DC

## 2020-09-09 MED ORDER — AMLODIPINE BESYLATE 10 MG PO TABS: 10.0000 mg | ORAL_TABLET | Freq: Every day | ORAL | 1 refills | Status: DC

## 2020-09-09 NOTE — Progress Notes (Signed)
   Subjective:    Patient ID: [name], female    DOB: [birth year], 65 y.o.   MRN: [id number]  HPI Pt here for follow up on DM. Pt is checking sugars prn. This morning sugar was 180. No other issues. Pt is taking all meds as directed. No issues with blood pressure. Does not check often.    Patient states she does not check her sugars often.  She denies any shortness of breath or chest pain Occasionally has palpitations Denies nausea vomiting She does try to eat relatively healthy She does state compliance with her medicines Denies any low sugar spells Denies any rectal bleeding  Review of Systems     Objective:   Physical Exam  General-in no acute distress Eyes-no discharge Lungs-respiratory rate normal, CTA CV-no murmurs,RRR Extremities skin warm dry no edema Neuro grossly normal Behavior normal, alert  Diabetic foot exam completed     Assessment & Plan:  1. Hyperlipidemia, unspecified hyperlipidemia type continue cholesterol medicine continue diet stay active - Lipid panel  2. Type 2 diabetes mellitus not at goal [facility]) diabetes not under good control patient needs to do a better job watching diet and also taking medication on a regular basis - Hemoglobin A1c - Microalbumin / creatinine urine ratio  3. Primary hypertension blood pressure decent control continue current measures - Basic metabolic panel  4. High risk medication use check liver function - Hepatic function panel  5. Hyperlipidemia associated with type 2 diabetes mellitus ([place]) Continue medication watch diet stay active  6. Type 2 diabetes mellitus with diabetic neuropathy, with long-term current use of insulin (HCC) mild neuropathy in the feet warnings discussed  there is certainly a possibility that the patient may need to be on increased dose of her medications await what the A1c shows follow-up within 4 to 6 months

## 2020-09-14 ENCOUNTER — Ambulatory Visit (INDEPENDENT_AMBULATORY_CARE_PROVIDER_SITE_OTHER): Payer: Medicare PPO | Admitting: *Deleted

## 2020-09-14 DIAGNOSIS — Z952 Presence of prosthetic heart valve: Secondary | ICD-10-CM | POA: Diagnosis not present

## 2020-09-14 DIAGNOSIS — Z5181 Encounter for therapeutic drug level monitoring: Secondary | ICD-10-CM

## 2020-09-14 LAB — POCT INR: INR: 3.4 — AB (ref 2.0–3.0)

## 2020-09-14 NOTE — Patient Instructions (Signed)
Hold warfarin tonight then resume 1/2 tablet daily except 1 tablet on Mondays, Wednesdays and Fridays Recheck in 4 weeks  Increase greens

## 2020-09-23 DIAGNOSIS — E785 Hyperlipidemia, unspecified: Secondary | ICD-10-CM | POA: Diagnosis not present

## 2020-09-23 DIAGNOSIS — Z79899 Other long term (current) drug therapy: Secondary | ICD-10-CM | POA: Diagnosis not present

## 2020-09-23 DIAGNOSIS — E119 Type 2 diabetes mellitus without complications: Secondary | ICD-10-CM | POA: Diagnosis not present

## 2020-09-23 DIAGNOSIS — I10 Essential (primary) hypertension: Secondary | ICD-10-CM | POA: Diagnosis not present

## 2020-09-24 LAB — HEPATIC FUNCTION PANEL
ALT: 14 IU/L (ref 0–32)
AST: 17 IU/L (ref 0–40)
Albumin: 4.3 g/dL (ref 3.8–4.8)
Alkaline Phosphatase: 102 IU/L (ref 44–121)
Bilirubin Total: 0.5 mg/dL (ref 0.0–1.2)
Bilirubin, Direct: 0.16 mg/dL (ref 0.00–0.40)
Total Protein: 6.6 g/dL (ref 6.0–8.5)

## 2020-09-24 LAB — LIPID PANEL
Chol/HDL Ratio: 3.2 ratio (ref 0.0–4.4)
Cholesterol, Total: 159 mg/dL (ref 100–199)
HDL: 49 mg/dL (ref >39)
LDL Chol Calc (NIH): 81 mg/dL (ref 0–99)
Triglycerides: 173 mg/dL — ABNORMAL HIGH (ref 0–149)
VLDL Cholesterol Cal: 29 mg/dL (ref 5–40)

## 2020-09-24 LAB — BASIC METABOLIC PANEL
BUN/Creatinine Ratio: 19 (ref 12–28)
BUN: 19 mg/dL (ref 8–27)
CO2: 22 mmol/L (ref 20–29)
Calcium: 8.9 mg/dL (ref 8.7–10.3)
Chloride: 98 mmol/L (ref 96–106)
Creatinine, Ser: 1.02 mg/dL — ABNORMAL HIGH (ref 0.57–1.00)
Glucose: 349 mg/dL — ABNORMAL HIGH (ref 65–99)
Potassium: 4.7 mmol/L (ref 3.5–5.2)
Sodium: 135 mmol/L (ref 134–144)
eGFR: 61 mL/min/{1.73_m2} (ref >59)

## 2020-09-24 LAB — HEMOGLOBIN A1C
Est. average glucose Bld gHb Est-mCnc: 263 mg/dL
Hgb A1c MFr Bld: 10.8 % — ABNORMAL HIGH (ref 4.8–5.6)

## 2020-09-24 LAB — MICROALBUMIN / CREATININE URINE RATIO
Creatinine, Urine: 80.1 mg/dL
Microalb/Creat Ratio: 21 mg/g creat (ref 0–29)
Microalbumin, Urine: 16.7 ug/mL

## 2020-10-12 ENCOUNTER — Other Ambulatory Visit: Payer: Self-pay

## 2020-10-12 ENCOUNTER — Ambulatory Visit (INDEPENDENT_AMBULATORY_CARE_PROVIDER_SITE_OTHER): Payer: Medicare PPO | Admitting: *Deleted

## 2020-10-12 DIAGNOSIS — Z952 Presence of prosthetic heart valve: Secondary | ICD-10-CM

## 2020-10-12 DIAGNOSIS — Z5181 Encounter for therapeutic drug level monitoring: Secondary | ICD-10-CM | POA: Diagnosis not present

## 2020-10-12 LAB — POCT INR: INR: 3.3 — AB (ref 2.0–3.0)

## 2020-10-12 NOTE — Patient Instructions (Signed)
Hold warfarin tonight then decrease dose to 1/2 tablet daily except 1 tablet on Mondays and Thursdays Recheck in 4 weeks  Continue greens

## 2020-10-27 ENCOUNTER — Other Ambulatory Visit: Payer: Self-pay | Admitting: Family Medicine

## 2020-11-09 ENCOUNTER — Ambulatory Visit (INDEPENDENT_AMBULATORY_CARE_PROVIDER_SITE_OTHER): Payer: Medicare PPO | Admitting: *Deleted

## 2020-11-09 DIAGNOSIS — Z5181 Encounter for therapeutic drug level monitoring: Secondary | ICD-10-CM | POA: Diagnosis not present

## 2020-11-09 DIAGNOSIS — Z952 Presence of prosthetic heart valve: Secondary | ICD-10-CM

## 2020-11-09 LAB — POCT INR: INR: 2.3 (ref 2.0–3.0)

## 2020-11-09 NOTE — Patient Instructions (Signed)
Continue warfarin 1/2 tablet daily except 1 tablet on Mondays and Thursdays Recheck in 4 weeks  Continue greens

## 2020-12-07 ENCOUNTER — Ambulatory Visit (INDEPENDENT_AMBULATORY_CARE_PROVIDER_SITE_OTHER): Payer: Medicare PPO | Admitting: *Deleted

## 2020-12-07 DIAGNOSIS — Z952 Presence of prosthetic heart valve: Secondary | ICD-10-CM | POA: Diagnosis not present

## 2020-12-07 DIAGNOSIS — Z5181 Encounter for therapeutic drug level monitoring: Secondary | ICD-10-CM

## 2020-12-07 LAB — POCT INR: INR: 2.3 (ref 2.0–3.0)

## 2020-12-07 NOTE — Patient Instructions (Signed)
Continue warfarin 1/2 tablet daily except 1 tablet on Mondays and Thursdays Recheck in 4 weeks  Continue greens

## 2020-12-12 ENCOUNTER — Telehealth: Payer: Self-pay | Admitting: *Deleted

## 2020-12-12 NOTE — Chronic Care Management (AMB) (Signed)
  Chronic Care Management   Note  12/12/2020 Name: [name] MRN: [id number] DOB: 06/28/1955  [name] is a 65 y.o. year old female who is a primary care patient of Luking, Elayne Snare, MD. I reached out to Lodema Pilot by phone today in response to a referral sent by [name]'s PCP.  [name] was given information about Chronic Care Management services today including:  CCM service includes personalized support from designated clinical staff supervised by her physician, including individualized plan of care and coordination with other care providers 24/7 contact phone numbers for assistance for urgent and routine care needs. Service will only be billed when office clinical staff spend 20 minutes or more in a month to coordinate care. Only one practitioner may furnish and bill the service in a calendar month. The patient may stop CCM services at any time (effective at the end of the month) by phone call to the office staff. The patient is responsible for co-pay (up to 20% after annual deductible is met) if co-pay is required by the individual health plan.   Patient agreed to services and verbal consent obtained.   Follow up plan: Telephone appointment with care management team member scheduled for:12/26/20  [facility]  Direct Dial: [phone]

## 2020-12-15 ENCOUNTER — Other Ambulatory Visit: Payer: Self-pay

## 2020-12-15 ENCOUNTER — Encounter: Payer: Self-pay | Admitting: Podiatrist

## 2020-12-15 ENCOUNTER — Ambulatory Visit: Payer: Medicare PPO | Admitting: Podiatrist

## 2020-12-15 DIAGNOSIS — L600 Ingrowing nail: Secondary | ICD-10-CM | POA: Diagnosis not present

## 2020-12-15 DIAGNOSIS — L609 Nail disorder, unspecified: Secondary | ICD-10-CM

## 2020-12-15 DIAGNOSIS — S90222A Contusion of left lesser toe(s) with damage to nail, initial encounter: Secondary | ICD-10-CM | POA: Diagnosis not present

## 2020-12-15 NOTE — Patient Instructions (Addendum)
Soak Instructions    THE DAY AFTER THE PROCEDURE  Place 1/4 cup of epsom salts in a quart of warm tap water.  Submerge your foot or feet with outer bandage intact for the initial soak; this will allow the bandage to become moist and wet for easy lift off.  Once you remove your bandage, continue to soak in the solution for 20 minutes.  This soak should be done twice a day.  Next, remove your foot or feet from solution, blot dry the affected area and cover.  Apply polysporin or neosporin.   You may use a band aid large enough to cover the area or use gauze and tape.     IF YOUR SKIN BECOMES IRRITATED WHILE USING THESE INSTRUCTIONS, IT IS OKAY TO SWITCH TO  antibacterial soap pump soap (Dial)  and water to keep the toe clean instead of soaking in epsom salts.    It will take about 9 + months to grow a new nail-  the first version of the nail will look bumpy-  as it slowly grows and is trimmed the nail will start to look normal again.

## 2020-12-15 NOTE — Progress Notes (Signed)
Chief Complaint  Patient presents with   Nail Problem    Lt hallux toenail lifting up, loose and detached x several years - no injury =- no pain/redness/swelling -w/ dried blood Tx; none   Diabetes    FBS: 118 x 2 wks A1C: 10 PCP: Luking x 3 mo      HPI: Patient is 65 y.o. female who presents today for a loose and lifting left hallux nail.  She denies any trauma or injury to the toe.  She states that the left toenail became loose and is now lifting off the nail plate.  She is diabetic with a last hemoglobin A1c of 10 and she is also currently taking Coumadin.  Patient Active Problem List   Diagnosis Date Noted   Visit for suture removal 03/28/2020   Laceration of right little finger 03/24/2020   Hyperlipidemia associated with type 2 diabetes mellitus ([place]) 12/08/2019   Encounter for screening colonoscopy 12/09/2017   Preoperative clearance 09/30/2017   ( 06/04/2016   Encounter for therapeutic drug monitoring 01/12/2016   S/P AVR 01/03/2016   Insomnia 06/15/2015   Thyroid nodule 09/15/2012   Renal insufficiency 09/15/2012   Aortic stenosis    Type 2 diabetes mellitus not at goal [facility])    Hypertension    Hyperlipidemia    Restless leg syndrome    Ruptured disk     Current Outpatient Medications on File Prior to Visit  Medication Sig Dispense Refill   acetaminophen (TYLENOL) 500 MG tablet Take 1,000 mg by mouth every 6 (six) hours as needed for moderate pain or headache.     ALPRAZolam (XANAX) 0.5 MG tablet TAKE 1/2 TO 1 TABLET BY MOUTH TWICE DAILY AS NEEDED. 30 tablet 2   amLODipine (NORVASC) 10 MG tablet Take 1 tablet (10 mg total) by mouth daily. 90 tablet 1   aspirin 81 MG tablet Take 81 mg by mouth daily.     Dulaglutide (TRULICITY) 1.5 SW/1.0XN SOPN INJECT 0.5MLS INTO THE SKIN ONCE A WEEK 2 mL 1   glipiZIDE (GLUCOTROL) 5 MG tablet TAKE TWO TABLETS BY MOUTH TWICE DAILY 360 tablet 1   insulin detemir (LEVEMIR FLEXTOUCH) 100 UNIT/ML FlexPen Inject 40 units into the skin at  bedtime. May titrate to 50 units 15 mL 6   Lancets (ONETOUCH DELICA PLUS ATFTDD22G) MISC USE AS DIRECTED. 100 each 0   lisinopril (ZESTRIL) 40 MG tablet Take 1 tablet (40 mg total) by mouth daily. 90 tablet 1   metoprolol tartrate (LOPRESSOR) 50 MG tablet 1 bid 180 tablet 1   pravastatin (PRAVACHOL) 20 MG tablet 1 q evening 90 tablet 1   SURE COMFORT PEN NEEDLES 31G X 5 MM MISC USE AS DIRECTED ONCE DAILY. 100 each 0   warfarin (COUMADIN) 5 MG tablet TAKE 1/2 TABLET BY MOUTH DAILY EXCEPT 1 TABLET ON MONDAYS, WEDNESDAYS AND FRIDAYS OR AS DIRECTED 90 tablet 6   No current facility-administered medications on file prior to visit.    No Known Allergies  Review of Systems No fevers, chills, nausea, muscle aches, no difficulty breathing, no calf pain, no chest pain or shortness of breath.   Physical Exam  GENERAL APPEARANCE: Alert, conversant. Appropriately groomed. No acute distress.   VASCULAR: Pedal pulses palpable DP and PT bilateral.  Capillary refill time is immediate to all digits,  Proximal to distal cooling it warm to warm.  Digital perfusion adequate.   NEUROLOGIC: sensation is intact to 5.07 monofilament at 5/5 sites bilateral.  Light touch is intact bilateral,  vibratory sensation intact bilateral  MUSCULOSKELETAL: acceptable muscle strength, tone and stability bilateral.  No gross boney pedal deformities noted.  No pain, crepitus or limitation noted with foot and ankle range of motion bilateral.   DERMATOLOGIC: skin is warm, supple, and dry.  Left hallux nail is lifting off of the nail bed.  Only the very proximal portion of the nail was attached.  No redness, no swelling, no drainage, no sign of infection is noted.  Loose and nonattached nail is present.   Assessment   1. Contusion of toenail of left foot, initial encounter   2. Loose toenail   3. Ingrowing toenail of left foot      Plan  Treatment options and alternatives were discussed with the patient discussed we can  remove the nail and allow the new nail to grow back into see if it would attach versus removing the nail permanently.  The patient would like to try and see if the new nail would attach.  I did agree I prepped the skin with alcohol infiltrated lidocaine mixture in a digital block fashion the toe was then prepped with Betadine and exsanguinated and the nail was carefully detached from its most proximal attachment.  The toe was then cleansed well with iodine and Silvadene cream and a dressing was applied.  The patient was given instructions for soaks and was told that it will take about 9 months for her to grow new nail.  During this time she will apply Vaseline to the nailbed in hopes that this will help the nail attached.  Discussed if the nail does not attach we can remove it in a permanent fashion.  Patient demonstrates an understanding of this conversation if any questions or concerns arise she will call otherwise she will be seen back as needed for follow-up.

## 2020-12-20 ENCOUNTER — Other Ambulatory Visit: Payer: Self-pay | Admitting: Family Medicine

## 2020-12-26 ENCOUNTER — Ambulatory Visit (INDEPENDENT_AMBULATORY_CARE_PROVIDER_SITE_OTHER): Payer: Medicare PPO | Admitting: Pharmacist

## 2020-12-26 DIAGNOSIS — E785 Hyperlipidemia, unspecified: Secondary | ICD-10-CM

## 2020-12-26 DIAGNOSIS — E119 Type 2 diabetes mellitus without complications: Secondary | ICD-10-CM

## 2020-12-26 DIAGNOSIS — I10 Essential (primary) hypertension: Secondary | ICD-10-CM

## 2020-12-26 NOTE — Patient Instructions (Signed)
[name],  It was great to talk to you today!  Please call me with any questions or concerns.   Visit Information   PATIENT GOALS:   Goals Addressed             This Visit's Progress    Medication Management       Patient Goals/Self-Care Activities Patient will:  Take medications as prescribed Check blood sugar three times a day at the following times: fasting (at least 8 hours since last food consumption), 1-2 hours after a meal, bedtime, and whenever patient experiences symptoms of hypo/hyperglycemia, document, and provide at future appointments Check blood pressure at least once daily, document, and provide at future appointments Engage in dietary modifications by less frequent dining out, decreased fat intake, and fewer sweetened foods & beverages        Consent to CCM Services: [name] was given information about Chronic Care Management services including:  CCM service includes personalized support from designated clinical staff supervised by her physician, including individualized plan of care and coordination with other care providers 24/7 contact phone numbers for assistance for urgent and routine care needs. Service will only be billed when office clinical staff spend 20 minutes or more in a month to coordinate care. Only one practitioner may furnish and bill the service in a calendar month. The patient may stop CCM services at any time (effective at the end of the month) by phone call to the office staff. The patient will be responsible for cost sharing (co-pay) of up to 20% of the service fee (after annual deductible is met).  Patient agreed to services and verbal consent obtained.   Patient verbalizes understanding of instructions provided today and agrees to view in [place].   Telephone follow up appointment with care management team member scheduled for:01/23/21  Kennon Holter, PharmD Clinical Pharmacist [place] [phone]  CLINICAL CARE PLAN: Patient Care Plan: Medication Management     Problem Identified: T2DM, HTN, HLD   Priority: High  Onset Date: 12/26/2020     Long-Range Goal: Disease Progression Prevention   Start Date: 12/26/2020  Expected End Date: 03/26/2021  This Visit's Progress: On track  Priority: High  Note:   Current Barriers:  Unable to independently monitor therapeutic efficacy Unable to achieve control of diabetes and hyperlipidemia Suboptimal therapeutic regimen for diabetes  Pharmacist Clinical Goal(s):  Patient will achieve adherence to monitoring guidelines and medication adherence to achieve therapeutic efficacy achieve control of diabetes and hyperlipidemia as evidenced by improved fasting blood sugar, improved A1c, improved LDL, and improved triglycerides adhere to plan to optimize therapeutic regimen for diabetes as evidenced by report of adherence to recommended medication management changes through collaboration with PharmD and provider.   Interventions: 1:1 collaboration with Tina Drown, MD regarding development and update of comprehensive plan of care as evidenced by provider attestation and co-signature Inter-disciplinary care team collaboration (see longitudinal plan of care) Comprehensive medication review performed; medication list updated in electronic medical record  Type 2 Diabetes - New goal.: Uncontrolled; Most recent A1c above goal of <7% per ADA guidelines Current medications: dulaglutide (Trulicity) 1.5 mg subcutaneously weekly, glipizide IR 10 mg by mouth twice daily with meals, and insulin detemir (Levemir) 42 units subcutaneously at bedtime Intolerances: none Taking medications as directed: yes Side effects thought to be attributed to current medication regimen: no Denies hypoglycemic symptoms (sweaty and shaky). Hypoglycemia prevention: not indicated at this time Current meal patterns: breakfast: 1 poptart; lunch: fast  food; dinner: fast food; Current exercise: not discussed today On a statin: yes On aspirin 81 mg daily: yes Last microalbumin/creatinine ratio: 21 (09/23/20); on an ACEi/ARB: yes Last eye exam: completed within last year Last foot exam: completed within last year Pneumonia vaccine: overdue, needs single dose of PPSV23 or PCV20. PCV13 received July 2021 Influenza vaccine:  needs this fall Shingrix: series complete Current glucose readings:  patient reports only checking once every 2-3 weeks at this time; fasting BG was 236 yesterday but was 118 a couple weeks ago. States it fluctuates quite a bit.  Instructed to monitor blood sugars three times a day at the following times: fasting (at least 8 hours since last food consumption), 1-2 hours after a meal, bedtime, and whenever patient experiences symptoms of hypo/hyperglycemia  Discussed management of hypoglycemia. If blood sugar <70 at any time, treat with simple sugar such as 1/2 cup juice or regular soda or 3-4 glucose tablets. Recheck blood sugar in 15 minutes and repeat if blood sugar remains <70.  Unable to make medication adjustments today since patient has not been consistently checking blood glucose but states she will start today. At follow-up visit, will consider switching from Levemir to [place] for improved duration of action. Plan to titrate dose of insulin until fasting BG within goal range of 80-130. Patient just paid for 3 month supply of Trulicity so does not want to change today, but this patient may benefit from change to [facility]. Will discuss further in December/January.  Unclear why metformin discontinued in the past. Will consider adding back metformin after discussion with PCP.   Hypertension - New goal.: Blood pressure under good control. Blood pressure is at goal of <130/80 mmHg per 2017 AHA/ACC guidelines. Current medications: lisinopril 40 mg by mouth once daily, amlodipine 10 mg by mouth once daily, and metoprolol  tartrate 50 mg by mouth twice daily Intolerances: none Taking medications as directed: yes Side effects thought to be attributed to current medication regimen: no Current home blood pressure: patient has a blood pressure machine but does not currently check Continue lisinopril 40 mg by mouth once daily, amlodipine 10 mg by mouth once daily, and metoprolol tartrate 50 mg by mouth twice daily Encourage dietary sodium restriction/DASH diet Recommend home blood pressure monitoring to discuss at next visit  Hyperlipidemia - New goal.: Uncontrolled. LDL above goal of <70 due to very high risk given diabetes + at least 1 additional major risk factor (hypertension) per 2020 AACE/ACE guidelines. Triglycerides above goal of <150 per 2020 AACE/ACE guidelines. Current medications: pravastatin 20 mg by mouth once daily Intolerances: none Taking medications as directed: yes Side effects thought to be attributed to current medication regimen: no Continue pravastatin 20 mg by mouth once daily Encourage dietary reduction of high fat containing foods such as butter, nuts, bacon, egg yolks, etc. Re-check lipid panel in 4-12 weeks Patient reports diet high in fast food intake which largely consists of fried foods. Patient wants to work on reducing this. Will consider increasing dose of pravastatin if LDL remains elevated at next check despite dietary change.  Patient Goals/Self-Care Activities Patient will:  Take medications as prescribed Check blood sugar three times a day at the following times: fasting (at least 8 hours since last food consumption), 1-2 hours after a meal, bedtime, and whenever patient experiences symptoms of hypo/hyperglycemia, document, and provide at future appointments Check blood pressure at least once daily, document, and provide at future appointments Engage in dietary modifications by less frequent dining out,  decreased fat intake, and fewer sweetened foods & beverages  Follow Up  Plan: Telephone follow up appointment with care management team member scheduled for: 01/23/21

## 2020-12-26 NOTE — Chronic Care Management (AMB) (Addendum)
Chronic Care Management Pharmacy Note  12/30/2020 Name:  [name] MRN:  [id number] DOB:  1955-08-23  Summary:  Type 2 Diabetes Current medications: dulaglutide (Trulicity) 1.5 mg subcutaneously weekly, glipizide IR 10 mg by mouth twice daily with meals, and insulin detemir (Levemir) 42 units subcutaneously at bedtime Current glucose readings:  patient reports only checking once every 2-3 weeks at this time; fasting BG was 236 yesterday but was 118 a couple weeks ago. States it fluctuates quite a bit.  Instructed to monitor blood sugars three times a day at the following times: fasting (at least 8 hours since last food consumption), 1-2 hours after a meal, bedtime, and whenever patient experiences symptoms of hypo/hyperglycemia  Unable to make medication adjustments today since patient has not been consistently checking blood glucose but states she will start today. At follow-up visit, will consider switching from Levemir to [place] for improved duration of action. Plan to titrate dose of insulin until fasting BG within goal range of 80-130. Patient just paid for 3 month supply of Trulicity so does not want to change today, but this patient may benefit from change to [facility]. Will discuss further in December/January.  Unclear why metformin discontinued in the past. Discussed with PCP and will plan to add back metformin at follow-up visit. Will start at 250 mg by mouth twice daily then increase to 500 mg by mouth twice daily after a couple weeks if able to tolerate.   Hyperlipidemia Patient reports diet high in fast food intake which largely consists of fried foods. Patient wants to work on reducing this. Will consider increasing dose of pravastatin if LDL remains elevated at next check despite dietary change.  Subjective: [name] is an 65 y.o. year old female who is a primary patient of Luking, Elayne Snare, MD.  The CCM team was consulted for assistance with disease management and  care coordination needs.    Engaged with patient by telephone for initial visit in response to provider referral for pharmacy case management and/or care coordination services.   Consent to Services:  The patient was given the following information about Chronic Care Management services today, agreed to services, and gave verbal consent: 1. CCM service includes personalized support from designated clinical staff supervised by the primary care provider, including individualized plan of care and coordination with other care providers 2. 24/7 contact phone numbers for assistance for urgent and routine care needs. 3. Service will only be billed when office clinical staff spend 20 minutes or more in a month to coordinate care. 4. Only one practitioner may furnish and bill the service in a calendar month. 5.The patient may stop CCM services at any time (effective at the end of the month) by phone call to the office staff. 6. The patient will be responsible for cost sharing (co-pay) of up to 20% of the service fee (after annual deductible is met). Patient agreed to services and consent obtained.  Patient Care Team: Kathyrn Drown, MD as PCP - General (Family Medicine) Satira Sark, MD as PCP - Cardiology (Cardiology) Danie Binder, MD (Inactive) as Consulting Physician (Gastroenterology) Beryle Lathe, [facility] (Pharmacist)  Objective:  Lab Results  Component Value Date   CREATININE 1.02 (H) 09/23/2020   CREATININE 1.04 (H) 05/04/2020   CREATININE 1.07 (H) 11/26/2019    Lab Results  Component Value Date   HGBA1C 10.8 (H) 09/23/2020   Last diabetic Eye exam:  Lab Results  Component Value Date/Time  HMDIABEYEEXA Retinopathy (A) 04/26/2020 12:00 AM    Last diabetic Foot exam: No results found for: HMDIABFOOTEX      Component Value Date/Time   CHOL 159 09/23/2020 1036   TRIG 173 (H) 09/23/2020 1036   HDL 49 09/23/2020 1036   CHOLHDL 3.2 09/23/2020 1036   CHOLHDL 2.5  07/02/2014 0714   VLDL 35 07/02/2014 0714   LDLCALC 81 09/23/2020 1036    Hepatic Function Latest Ref Rng & Units 09/23/2020 11/26/2019 03/26/2019  Total Protein 6.0 - 8.5 g/dL 6.6 6.7 6.6  Albumin 3.8 - 4.8 g/dL 4.3 4.3 4.1  AST 0 - 40 IU/L '17 19 22  ' ALT 0 - 32 IU/L '14 17 15  ' Alk Phosphatase 44 - 121 IU/L 102 101 92  Total Bilirubin 0.0 - 1.2 mg/dL 0.5 0.6 0.4  Bilirubin, Direct 0.00 - 0.40 mg/dL 0.16 - 0.15    Lab Results  Component Value Date/Time   TSH 1.958 09/14/2013 12:01 AM   TSH 1.711 09/19/2012 10:20 AM   FREET4 1.37 09/14/2013 12:01 AM    CBC Latest Ref Rng & Units 11/26/2019 01/07/2018 07/03/2017  WBC 3.4 - 10.8 x10E3/uL 9.4 8.1 10.4  Hemoglobin 11.1 - 15.9 g/dL 13.4 12.7 13.6  Hematocrit 34.0 - 46.6 % 40.0 38.0 41.3  Platelets 150 - 450 x10E3/uL 358 313 319    No results found for: VD25OH  Clinical ASCVD: No  The 10-year ASCVD risk score (Arnett DK, et al., 2019) is: 13.6%   Values used to calculate the score:     Age: 65 years     Sex: Female     Is Non-Hispanic African American: No     Diabetic: Yes     Tobacco smoker: No     Systolic Blood Pressure: 543 mmHg     Is BP treated: Yes     HDL Cholesterol: 49 mg/dL     Total Cholesterol: 159 mg/dL    Social History   Tobacco Use  Smoking Status Never  Smokeless Tobacco Never   BP Readings from Last 3 Encounters:  09/09/20 124/72  03/28/20 124/70  03/24/20 130/82   Pulse Readings from Last 3 Encounters:  03/28/20 (!) 118  03/24/20 78  03/15/20 77   Wt Readings from Last 3 Encounters:  09/09/20 249 lb 6.4 oz (113.1 kg)  03/28/20 247 lb (112 kg)  03/24/20 249 lb 6.4 oz (113.1 kg)    Assessment: Review of patient past medical history, allergies, medications, health status, including review of consultants reports, laboratory and other test data, was performed as part of comprehensive evaluation and provision of chronic care management services.   SDOH:  (Social Determinants of Health) assessments and  interventions performed:    CCM Care Plan  No Known Allergies  Medications Reviewed Today     Reviewed by Beryle Lathe, [facility] (Pharmacist) on 12/26/20 at [place] List Status: <None>   Medication Order Taking? Sig Documenting Provider Last Dose Status Informant  acetaminophen (TYLENOL) 500 MG tablet [id number] Yes Take 1,000 mg by mouth every 6 (six) hours as needed for moderate pain or headache. [provider] Taking Active Self  ALPRAZolam Duanne Moron) 0.5 MG tablet [id number] Yes TAKE 1/2 TO 1 TABLET BY MOUTH TWICE DAILY AS NEEDED. Kathyrn Drown, MD Taking Active   amLODipine (NORVASC) 10 MG tablet [id number] Yes TAKE ONE TABLET BY MOUTH DAILY Kathyrn Drown, MD Taking Active   aspirin 81 MG tablet [id number] Yes Take 81 mg by mouth daily. [provider] Taking Active Self  glipiZIDE (GLUCOTROL) 5 MG tablet [id number] Yes TAKE (2) TABLETS BY MOUTH TWICE DAILY. Kathyrn Drown, MD Taking Active   insulin detemir (LEVEMIR FLEXTOUCH) 100 UNIT/ML FlexPen [id number] Yes Inject 40 units into the skin at bedtime. May titrate to 50 units Luking, Elayne Snare, MD Taking Active            Med Note Rhea Belton Dec 26, 2020  9:08 AM) Currently using 42 units  Lancets (ONETOUCH DELICA PLUS [id number]) [place] [id number]  USE AS DIRECTED. Kathyrn Drown, MD  Active   lisinopril (ZESTRIL) 40 MG tablet [id number] Yes TAKE ONE TABLET BY MOUTH ONCE DAILY. Kathyrn Drown, MD Taking Active   metoprolol tartrate (LOPRESSOR) 50 MG tablet [id number] Yes 1 bid Kathyrn Drown, MD Taking Active   pravastatin (PRAVACHOL) 20 MG tablet [id number] Yes TAKE 1 TABLET BY MOUTH AT BEDTIME FOR CHOLESTEROL. Kathyrn Drown, MD Taking Active   SURE COMFORT PEN NEEDLES 31G X 5 MM MISC [id number]  USE AS DIRECTED ONCE DAILY. Kathyrn Drown, MD  Active   TRULICITY 1.5 JO/8.4ZY [place] [id number] Yes INJECT 0.5MLS INTO THE SKIN ONCE A WEEK Kathyrn Drown, MD Taking Active   warfarin (COUMADIN) 5 MG  tablet [id number] Yes TAKE 1/2 TABLET BY MOUTH DAILY EXCEPT 1 TABLET ON MONDAYS, [place] AND FRIDAYS OR AS DIRECTED Satira Sark, MD Taking Active             Patient Active Problem List   Diagnosis Date Noted   Visit for suture removal 03/28/2020   Laceration of right little finger 03/24/2020   Hyperlipidemia associated with type 2 diabetes mellitus ([place]) 12/08/2019   Encounter for screening colonoscopy 12/09/2017   Preoperative clearance 09/30/2017   ( 06/04/2016   Encounter for therapeutic drug monitoring 01/12/2016   S/P AVR 01/03/2016   Insomnia 06/15/2015   Thyroid nodule 09/15/2012   Renal insufficiency 09/15/2012   Aortic stenosis    Type 2 diabetes mellitus not at goal [facility])    Hypertension    Hyperlipidemia    Restless leg syndrome    Ruptured disk     Immunization History  Administered Date(s) Administered   Fluad Quad(high Dose 65+) 12/08/2019   Influenza Split 12/18/2012   Influenza,inj,Quad PF,6+ Mos 11/30/2013, 12/30/2014, 01/10/2016, 11/26/2016, 01/07/2018, 12/26/2018   Influenza-Unspecified 02/03/2012, 01/07/2018   Moderna Sars-Covid-2 Vaccination 10/08/2019, 11/09/2019   Pneumococcal Conjugate-13 08/27/2019   Pneumococcal Polysaccharide-23 09/03/2007, 01/10/2016   Tdap 03/15/2020   Zoster Recombinat (Shingrix) 11/23/2019, 05/04/2020    Conditions to be addressed/monitored: HTN, HLD, and DMII  Care Plan : Medication Management  Updates made by Beryle Lathe, [place] since 12/30/2020 12:00 AM     Problem: T2DM, HTN, HLD   Priority: High  Onset Date: 12/26/2020     Long-Range Goal: Disease Progression Prevention   Start Date: 12/26/2020  Expected End Date: 03/26/2021  This Visit's Progress: On track  Priority: High  Note:   Current Barriers:  Unable to independently monitor therapeutic efficacy Unable to achieve control of diabetes and hyperlipidemia Suboptimal therapeutic regimen for diabetes  Pharmacist Clinical Goal(s):   Patient will achieve adherence to monitoring guidelines and medication adherence to achieve therapeutic efficacy achieve control of diabetes and hyperlipidemia as evidenced by improved fasting blood sugar, improved A1c, improved LDL, and improved triglycerides adhere to plan to optimize therapeutic regimen for diabetes as evidenced by report of adherence to recommended medication management changes through collaboration with PharmD and  provider.   Interventions: 1:1 collaboration with Kathyrn Drown, MD regarding development and update of comprehensive plan of care as evidenced by provider attestation and co-signature Inter-disciplinary care team collaboration (see longitudinal plan of care) Comprehensive medication review performed; medication list updated in electronic medical record  Type 2 Diabetes - New goal.: Uncontrolled; Most recent A1c above goal of <7% per ADA guidelines Current medications: dulaglutide (Trulicity) 1.5 mg subcutaneously weekly, glipizide IR 10 mg by mouth twice daily with meals, and insulin detemir (Levemir) 42 units subcutaneously at bedtime Intolerances: none Taking medications as directed: yes Side effects thought to be attributed to current medication regimen: no Denies hypoglycemic symptoms (sweaty and shaky). Hypoglycemia prevention: not indicated at this time Current meal patterns: breakfast: 1 poptart; lunch: fast food; dinner: fast food; Current exercise: not discussed today On a statin: yes On aspirin 81 mg daily: yes Last microalbumin/creatinine ratio: 21 (09/23/20); on an ACEi/ARB: yes Last eye exam: completed within last year Last foot exam: completed within last year Pneumonia vaccine: overdue, needs single dose of PPSV23 or PCV20. PCV13 received July 2021 Influenza vaccine:  needs this fall Shingrix: series complete Current glucose readings:  patient reports only checking once every 2-3 weeks at this time; fasting BG was 236 yesterday but was 118  a couple weeks ago. States it fluctuates quite a bit.  Instructed to monitor blood sugars three times a day at the following times: fasting (at least 8 hours since last food consumption), 1-2 hours after a meal, bedtime, and whenever patient experiences symptoms of hypo/hyperglycemia  Discussed management of hypoglycemia. If blood sugar <70 at any time, treat with simple sugar such as 1/2 cup juice or regular soda or 3-4 glucose tablets. Recheck blood sugar in 15 minutes and repeat if blood sugar remains <70.  Unable to make medication adjustments today since patient has not been consistently checking blood glucose but states she will start today. At follow-up visit, will consider switching from Levemir to [place] for improved duration of action. Plan to titrate dose of insulin until fasting BG within goal range of 80-130. Patient just paid for 3 month supply of Trulicity so does not want to change today, but this patient may benefit from change to [facility]. Will discuss further in December/January.  Unclear why metformin discontinued in the past. Discussed with PCP and will plan to add back metformin at follow-up visit. Will start at 250 mg by mouth twice daily then increase to 500 mg by mouth twice daily after a couple weeks if able to tolerate.   Hypertension - New goal.: Blood pressure under good control. Blood pressure is at goal of <130/80 mmHg per 2017 AHA/ACC guidelines. Current medications: lisinopril 40 mg by mouth once daily, amlodipine 10 mg by mouth once daily, and metoprolol tartrate 50 mg by mouth twice daily Intolerances: none Taking medications as directed: yes Side effects thought to be attributed to current medication regimen: no Current home blood pressure: patient has a blood pressure machine but does not currently check Continue lisinopril 40 mg by mouth once daily, amlodipine 10 mg by mouth once daily, and metoprolol tartrate 50 mg by mouth twice daily Encourage dietary sodium  restriction/DASH diet Recommend home blood pressure monitoring to discuss at next visit  Hyperlipidemia - New goal.: Uncontrolled. LDL above goal of <70 due to very high risk given diabetes + at least 1 additional major risk factor (hypertension) per 2020 AACE/ACE guidelines. Triglycerides above goal of <150 per 2020 AACE/ACE guidelines. Current medications: pravastatin 20  mg by mouth once daily Intolerances: none Taking medications as directed: yes Side effects thought to be attributed to current medication regimen: no Continue pravastatin 20 mg by mouth once daily Encourage dietary reduction of high fat containing foods such as butter, nuts, bacon, egg yolks, etc. Re-check lipid panel in 4-12 weeks Patient reports diet high in fast food intake which largely consists of fried foods. Patient wants to work on reducing this. Will consider increasing dose of pravastatin if LDL remains elevated at next check despite dietary change.  Patient Goals/Self-Care Activities Patient will:  Take medications as prescribed Check blood sugar three times a day at the following times: fasting (at least 8 hours since last food consumption), 1-2 hours after a meal, bedtime, and whenever patient experiences symptoms of hypo/hyperglycemia, document, and provide at future appointments Check blood pressure at least once daily, document, and provide at future appointments Engage in dietary modifications by less frequent dining out, decreased fat intake, and fewer sweetened foods & beverages  Follow Up Plan: Telephone follow up appointment with care management team member scheduled for: 01/23/21      Medication Assistance: None required.  Patient affirms current coverage meets needs.  Patient's preferred pharmacy is:  [facility], [place] [address] Phone: [phone] Fax: [phone]  Follow Up:  Patient agrees to Care Plan and  Follow-up.  Plan: Telephone follow up appointment with care management team member scheduled for:  01/23/21  Kennon Holter, PharmD Clinical Pharmacist [place] [phone]

## 2021-01-02 DIAGNOSIS — E119 Type 2 diabetes mellitus without complications: Secondary | ICD-10-CM | POA: Diagnosis not present

## 2021-01-02 DIAGNOSIS — E785 Hyperlipidemia, unspecified: Secondary | ICD-10-CM

## 2021-01-02 DIAGNOSIS — I10 Essential (primary) hypertension: Secondary | ICD-10-CM

## 2021-01-11 ENCOUNTER — Ambulatory Visit (INDEPENDENT_AMBULATORY_CARE_PROVIDER_SITE_OTHER): Payer: Medicare PPO | Admitting: *Deleted

## 2021-01-11 DIAGNOSIS — Z952 Presence of prosthetic heart valve: Secondary | ICD-10-CM

## 2021-01-11 DIAGNOSIS — Z5181 Encounter for therapeutic drug level monitoring: Secondary | ICD-10-CM | POA: Diagnosis not present

## 2021-01-11 LAB — POCT INR: INR: 2.6 (ref 2.0–3.0)

## 2021-01-11 NOTE — Patient Instructions (Signed)
Continue warfarin 1/2 tablet daily except 1 tablet on Mondays and Thursdays Recheck in 6 weeks  Continue greens

## 2021-01-22 ENCOUNTER — Encounter: Payer: Self-pay | Admitting: Family Medicine

## 2021-01-23 ENCOUNTER — Encounter: Payer: Self-pay | Admitting: Pharmacist

## 2021-01-23 ENCOUNTER — Ambulatory Visit (INDEPENDENT_AMBULATORY_CARE_PROVIDER_SITE_OTHER): Payer: Medicare PPO | Admitting: Pharmacist

## 2021-01-23 DIAGNOSIS — E785 Hyperlipidemia, unspecified: Secondary | ICD-10-CM

## 2021-01-23 DIAGNOSIS — E119 Type 2 diabetes mellitus without complications: Secondary | ICD-10-CM

## 2021-01-23 DIAGNOSIS — I10 Essential (primary) hypertension: Secondary | ICD-10-CM

## 2021-01-23 MED ORDER — NOVOLOG FLEXPEN 100 UNIT/ML ~~LOC~~ SOPN: 6.0000 [IU] | PEN_INJECTOR | Freq: Three times a day (TID) | SUBCUTANEOUS | 1 refills | Status: DC

## 2021-01-23 MED ORDER — METFORMIN HCL ER 500 MG PO TB24: 500.0000 mg | ORAL_TABLET | Freq: Two times a day (BID) | ORAL | 5 refills | Status: DC

## 2021-01-23 MED ORDER — METFORMIN HCL 500 MG PO TABS: ORAL_TABLET | ORAL | 0 refills | Status: DC

## 2021-01-23 MED ORDER — SURE COMFORT PEN NEEDLES 31G X 5 MM MISC: 2 refills | Status: DC

## 2021-01-23 MED ORDER — INSULIN DEGLUDEC 100 UNIT/ML ~~LOC~~ SOPN: 50.0000 [IU] | PEN_INJECTOR | Freq: Every day | SUBCUTANEOUS | 2 refills | Status: DC

## 2021-01-23 NOTE — Patient Instructions (Addendum)
[name],  It was great to talk to you today!  Please call me with any questions or concerns.   Start taking Tresiba 50 units at bedtime. This is the insulin that is replacing your Levemir. We increased your dose to 50 units. Restart metformin. Start taking metformin immediate release 250 mg (half tablet) by mouth twice daily with meals then increase to 500 mg (1 tablet) by mouth twice daily with meals after a couple weeks if able to tolerate. Then switch to metformin extended release formulation and take 500 mg (1 tablet) by mouth twice daily with meals.  Stop taking glipizide Start taking Novolog 6 units three time daily 5-10 minutes before meals. Skip mealtime insulin if not eating.  Continue Trulicity 1.5 mg subcutaneously once weekly for now  Visit Information  Thank you for taking time to visit with me today. Please don't hesitate to contact me if I can be of assistance to you before our next scheduled telephone appointment.  Following are the goals we discussed today:  Patient Goals/Self-Care Activities Patient will:  Take medications as prescribed Check blood sugar three times a day at the following times: fasting (at least 8 hours since last food consumption), 1-2 hours after a meal, bedtime, and whenever patient experiences symptoms of hypo/hyperglycemia, document, and provide at future appointments Check blood pressure at least once daily, document, and provide at future appointments Engage in dietary modifications by less frequent dining out, decreased fat intake, and fewer sweetened foods & beverages  Our next appointment is by telephone on 02/20/21 at 9 AM  Please call the care guide team at [phone] if you need to cancel or reschedule your appointment.   Please call [phone]-TALK (toll free, 24 hour hotline) if you are experiencing a Mental Health or [place] or need someone to talk to.  Patient verbalizes understanding of instructions provided  today and agrees to view in [place].   Kennon Holter, PharmD, [employer] Clinical Pharmacist [place] [phone]

## 2021-01-23 NOTE — Chronic Care Management (AMB) (Signed)
Chronic Care Management Pharmacy Note  01/23/2021 Name:  [name] MRN:  [id number] DOB:  24-Dec-1955  Summary:  Type 2 Diabetes Patient has started checking blood glucose 2-3 times per day; BG report send via MyChart to PCP on 01/22/21; fasting BG range 143-313 with most between 180-210 which is above the goal of 80-130 per ADA guideliens; post-prandial blood glucose range 294-397 which is above goal of <180 per ADA guideliens; bedtime blood glucose significantly elevated with range 295-502 with most being in the 300s Instructed to monitor blood sugars three times a day at the following times: fasting (at least 8 hours since last food consumption), 1-2 hours after a meal, bedtime, and whenever patient experiences symptoms of hypo/hyperglycemia  Discussed management of hypoglycemia. If blood sugar <70 at any time, treat with simple sugar such as 1/2 cup juice or regular soda or 3-4 glucose tablets. Recheck blood sugar in 15 minutes and repeat if blood sugar remains <70.  Discussed patient's blood glucose with PCP and decided on the following changes: Based on elevated fasting blood glucose, will increase basal insulin by 20% (50 units at bedtime). Will also switch to longer acting basal insulin Tresiba. Plan to titrate dose of insulin until fasting BG within goal range of 80-130. Based on significant increase in blood glucose after meals and at bedtime, will discontinue glipizide and add Novolog 6 units three time daily 5-10 minutes before meals. Patient in agreement in order to get blood glucose under control. This is a conservative bolus dose and will likely need to increase. Patient instructed to check blood glucose 1-2 hours after meals so assess bolus insulin requirements. Patient educated to skip mealtime insulin if not eating.  Continue Trulicity 1.5 mg subcutaneously once weekly for now Restart metformin. Will start at 250 mg by mouth twice daily then increase to 500 mg by mouth twice  daily after a couple weeks if able to tolerate. Then will switch to XR formulation.   Hyperlipidemia Consider increasing dose of pravastatin if LDL remains elevated at next check  Subjective: [name] is an 65 y.o. year old female who is a primary patient of Luking, Elayne Snare, MD.  The CCM team was consulted for assistance with disease management and care coordination needs.    Engaged with patient by telephone for follow up visit in response to provider referral for pharmacy case management and/or care coordination services.   Consent to Services:  The patient was given information about Chronic Care Management services, agreed to services, and gave verbal consent prior to initiation of services.  Please see initial visit note for detailed documentation.   Patient Care Team: Kathyrn Drown, MD as PCP - General (Family Medicine) Satira Sark, MD as PCP - Cardiology (Cardiology) Danie Binder, MD (Inactive) as Consulting Physician (Gastroenterology) Beryle Lathe, [facility] (Pharmacist)  Objective:  Lab Results  Component Value Date   CREATININE 1.02 (H) 09/23/2020   CREATININE 1.04 (H) 05/04/2020   CREATININE 1.07 (H) 11/26/2019    Lab Results  Component Value Date   HGBA1C 10.8 (H) 09/23/2020   Last diabetic Eye exam:  Lab Results  Component Value Date/Time   HMDIABEYEEXA Retinopathy (A) 04/26/2020 12:00 AM    Last diabetic Foot exam: No results found for: HMDIABFOOTEX      Component Value Date/Time   CHOL 159 09/23/2020 1036   TRIG 173 (H) 09/23/2020 1036   HDL 49 09/23/2020 1036   CHOLHDL 3.2 09/23/2020 1036  CHOLHDL 2.5 07/02/2014 0714   VLDL 35 07/02/2014 0714   LDLCALC 81 09/23/2020 1036    Hepatic Function Latest Ref Rng & Units 09/23/2020 11/26/2019 03/26/2019  Total Protein 6.0 - 8.5 g/dL 6.6 6.7 6.6  Albumin 3.8 - 4.8 g/dL 4.3 4.3 4.1  AST 0 - 40 IU/L _0 ALT 0 - 32 IU/L _1 Alk Phosphatase 44 - 121 IU/L 102 101 92  Total  Bilirubin 0.0 - 1.2 mg/dL 0.5 0.6 0.4  Bilirubin, Direct 0.00 - 0.40 mg/dL 0.16 - 0.15    Lab Results  Component Value Date/Time   TSH 1.958 09/14/2013 12:01 AM   TSH 1.711 09/19/2012 10:20 AM   FREET4 1.37 09/14/2013 12:01 AM    CBC Latest Ref Rng & Units 11/26/2019 01/07/2018 07/03/2017  WBC 3.4 - 10.8 x10E3/uL 9.4 8.1 10.4  Hemoglobin 11.1 - 15.9 g/dL 13.4 12.7 13.6  Hematocrit 34.0 - 46.6 % 40.0 38.0 41.3  Platelets 150 - 450 x10E3/uL 358 313 319    No results found for: VD25OH  Clinical ASCVD: No  The 10-year ASCVD risk score (Arnett DK, et al., 2019) is: 15.2%   Values used to calculate the score:     Age: 65 years     Sex: Female     Is Non-Hispanic African American: No     Diabetic: Yes     Tobacco smoker: No     Systolic Blood Pressure: 628 mmHg     Is BP treated: Yes     HDL Cholesterol: 49 mg/dL     Total Cholesterol: 159 mg/dL     Social History   Tobacco Use  Smoking Status Never  Smokeless Tobacco Never   BP Readings from Last 3 Encounters:  09/09/20 124/72  03/28/20 124/70  03/24/20 130/82   Pulse Readings from Last 3 Encounters:  03/28/20 (!) 118  03/24/20 78  03/15/20 77   Wt Readings from Last 3 Encounters:  09/09/20 249 lb 6.4 oz (113.1 kg)  03/28/20 247 lb (112 kg)  03/24/20 249 lb 6.4 oz (113.1 kg)    Assessment: Review of patient past medical history, allergies, medications, health status, including review of consultants reports, laboratory and other test data, was performed as part of comprehensive evaluation and provision of chronic care management services.   SDOH:  (Social Determinants of Health) assessments and interventions performed:    CCM Care Plan  No Known Allergies  Medications Reviewed Today     Reviewed by Beryle Lathe, [facility] (Pharmacist) on 01/23/21 at 0904  Med List Status: <None>   Medication Order Taking? Sig Documenting Provider Last Dose Status Informant  acetaminophen (TYLENOL) 500 MG tablet [id number]  Yes Take 1,000 mg by mouth every 6 (six) hours as needed for moderate pain or headache. [provider] Taking Active Self  ALPRAZolam Duanne Moron) 0.5 MG tablet [id number] Yes TAKE 1/2 TO 1 TABLET BY MOUTH TWICE DAILY AS NEEDED. Kathyrn Drown, MD Taking Active   amLODipine (NORVASC) 10 MG tablet [id number] Yes TAKE ONE TABLET BY MOUTH DAILY Kathyrn Drown, MD Taking Active   aspirin 81 MG tablet [id number] Yes Take 81 mg by mouth daily. [provider] Taking Active Self  glipiZIDE (GLUCOTROL) 5 MG tablet [id number] Yes TAKE (2) TABLETS BY MOUTH TWICE DAILY. Kathyrn Drown, MD Taking Active   insulin detemir (LEVEMIR FLEXTOUCH) 100 UNIT/ML FlexPen [id number] Yes Inject 40 units into the skin at bedtime. May titrate to 50 units Luking, Scott A,  MD Taking Active            Med Note Rhea Belton Dec 26, 2020  9:08 AM) Currently using 42 units  Lancets [facility] Donaciano Eva PLUS [id number]) MISC [id number]  USE AS DIRECTED. Kathyrn Drown, MD  Active   lisinopril (ZESTRIL) 40 MG tablet [id number] Yes TAKE ONE TABLET BY MOUTH ONCE DAILY. Kathyrn Drown, MD Taking Active   metoprolol tartrate (LOPRESSOR) 50 MG tablet [id number] Yes 1 bid Kathyrn Drown, MD Taking Active   pravastatin (PRAVACHOL) 20 MG tablet [id number] Yes TAKE 1 TABLET BY MOUTH AT BEDTIME FOR CHOLESTEROL. Kathyrn Drown, MD Taking Active   SURE COMFORT PEN NEEDLES 31G X 5 MM MISC [id number]  USE AS DIRECTED ONCE DAILY. Kathyrn Drown, MD  Active   TRULICITY 1.5 IY/6.4BR Bon[place] Aid [id number] Yes INJECT 0.5MLS INTO THE SKIN ONCE A WEEK Kathyrn Drown, MD Taking Active   warfarin (COUMADIN) 5 MG tablet [id number] Yes TAKE 1/2 TABLET BY MOUTH DAILY EXCEPT 1 TABLET ON MONDAYS, Uncertain AND FRIDAYS OR AS DIRECTED Satira Sark, MD Taking Active             Patient Active Problem List   Diagnosis Date Noted   Visit for suture removal 03/28/2020   Laceration of right little finger 03/24/2020   Hyperlipidemia  associated with type 2 diabetes mellitus ([place]) 12/08/2019   Encounter for screening colonoscopy 12/09/2017   Preoperative clearance 09/30/2017   ( 06/04/2016   Encounter for therapeutic drug monitoring 01/12/2016   S/P AVR 01/03/2016   Insomnia 06/15/2015   Thyroid nodule 09/15/2012   Renal insufficiency 09/15/2012   Aortic stenosis    Type 2 diabetes mellitus not at goal [facility])    Hypertension    Hyperlipidemia    Restless leg syndrome    Ruptured disk     Immunization History  Administered Date(s) Administered   Fluad Quad(high Dose 65+) 12/08/2019, 12/28/2020   Influenza Split 12/18/2012   Influenza,inj,Quad PF,6+ Mos 11/30/2013, 12/30/2014, 01/10/2016, 11/26/2016, 01/07/2018, 12/26/2018   Influenza-Unspecified 02/03/2012, 01/07/2018   Moderna Sars-Covid-2 Vaccination 10/08/2019, 11/09/2019   Pneumococcal Conjugate-13 08/27/2019   Pneumococcal Polysaccharide-23 09/03/2007, 01/10/2016   Tdap 03/15/2020   Zoster Recombinat (Shingrix) 11/23/2019, 05/04/2020    Conditions to be addressed/monitored: HTN, HLD, and DMII  Care Plan : Medication Management  Updates made by Beryle Lathe, [place] since 01/23/2021 12:00 AM     Problem: T2DM, HTN, HLD   Priority: High  Onset Date: 12/26/2020     Long-Range Goal: Disease Progression Prevention   Start Date: 12/26/2020  Expected End Date: 03/26/2021  Recent Progress: On track  Priority: High  Note:   Current Barriers:  Unable to independently monitor therapeutic efficacy Unable to achieve control of diabetes and hyperlipidemia Suboptimal therapeutic regimen for diabetes  Pharmacist Clinical Goal(s):  Patient will achieve adherence to monitoring guidelines and medication adherence to achieve therapeutic efficacy achieve control of diabetes and hyperlipidemia as evidenced by improved fasting blood sugar, improved A1c, improved LDL, and improved triglycerides adhere to plan to optimize therapeutic regimen for diabetes as  evidenced by report of adherence to recommended medication management changes through collaboration with PharmD and provider.   Interventions: 1:1 collaboration with Kathyrn Drown, MD regarding development and update of comprehensive plan of care as evidenced by provider attestation and co-signature Inter-disciplinary care team collaboration (see longitudinal plan of care) Comprehensive medication review performed; medication list updated in electronic medical record  Type 2 Diabetes -  Goal on Track (progressing): YES.: Uncontrolled; Most recent A1c above goal of <7% per ADA guidelines Current medications: dulaglutide (Trulicity) 1.5 mg subcutaneously weekly, glipizide IR 10 mg by mouth twice daily with meals, and insulin detemir (Levemir) 42 units subcutaneously at bedtime Intolerances: none Taking medications as directed: yes Side effects thought to be attributed to current medication regimen: no Denies hypoglycemic symptoms (sweaty and shaky). Denies hyperglycemic symptoms (excessive thirst, excessive hunger, and excessive urination. Hypoglycemia prevention: not indicated at this time Current meal patterns: breakfast: 1 poptart (patient plans to switch to scrambled egg with 1 piece of toast; lunch: fast food; dinner: fast food; Drinks diet soda  Current exercise: not discussed today On a statin: yes On aspirin 81 mg daily: yes Last microalbumin/creatinine ratio: 21 (09/23/20); on an ACEi/ARB: yes Last eye exam: completed within last year Last foot exam: completed within last year Pneumonia vaccine: overdue, needs single dose of PPSV23 or PCV20. PCV13 received July 2021 Influenza vaccine: up to date Shingrix: series complete Current glucose readings:  patient has started checking blood glucose 2-3 times per day; BG report send via MyChart to PCP on 01/22/21; fasting BG range 143-313 with most between 180-210 which is above the goal of 80-130 per ADA guideliens; post-prandial blood glucose  range 294-397 which is above goal of <180 per ADA guideliens; bedtime blood glucose significantly elevated with range 295-502 with most being in the 300s Instructed to monitor blood sugars three times a day at the following times: fasting (at least 8 hours since last food consumption), 1-2 hours after a meal, bedtime, and whenever patient experiences symptoms of hypo/hyperglycemia  Discussed management of hypoglycemia. If blood sugar <70 at any time, treat with simple sugar such as 1/2 cup juice or regular soda or 3-4 glucose tablets. Recheck blood sugar in 15 minutes and repeat if blood sugar remains <70.  Discussed patient's blood glucose with PCP and decided on the following changes: Based on elevated fasting blood glucose, will increase basal insulin by 20% (50 units at bedtime). Will also switch to longer acting basal insulin Tresiba. Plan to titrate dose of insulin until fasting BG within goal range of 80-130. Based on significant increase in blood glucose after meals and at bedtime, will discontinue glipizide and add Novolog 6 units three time daily 5-10 minutes before meals. Patient in agreement in order to get blood glucose under control. This is a conservative bolus dose and will likely need to increase. Patient instructed to check blood glucose 1-2 hours after meals so assess bolus insulin requirements. Patient educated to skip mealtime insulin if not eating.  Continue Trulicity 1.5 mg subcutaneously once weekly for now Restart metformin. Will start at 250 mg by mouth twice daily then increase to 500 mg by mouth twice daily after a couple weeks if able to tolerate. Then will switch to XR formulation.   Hypertension - Condition stable. Not addressed this visit.: Blood pressure under good control. Blood pressure is at goal of <130/80 mmHg per 2017 AHA/ACC guidelines. Current medications: lisinopril 40 mg by mouth once daily, amlodipine 10 mg by mouth once daily, and metoprolol tartrate 50 mg by  mouth twice daily Intolerances: none Taking medications as directed: yes Side effects thought to be attributed to current medication regimen: no Current home blood pressure: patient has a blood pressure machine but does not currently check Continue lisinopril 40 mg by mouth once daily, amlodipine 10 mg by mouth once daily, and metoprolol tartrate 50 mg by mouth twice daily Encourage dietary  sodium restriction/DASH diet Recommend home blood pressure monitoring to discuss at next visit  Hyperlipidemia - Goal on Track (progressing): YES.: Uncontrolled. LDL above goal of <70 due to very high risk given diabetes + at least 1 additional major risk factor (hypertension) per 2020 AACE/ACE guidelines. Triglycerides above goal of <150 per 2020 AACE/ACE guidelines. Current medications: pravastatin 20 mg by mouth once daily Intolerances: none Taking medications as directed: yes Side effects thought to be attributed to current medication regimen: no Continue pravastatin 20 mg by mouth once daily Encourage dietary reduction of high fat containing foods such as butter, nuts, bacon, egg yolks, etc. Re-check lipid panel in 4-12 weeks Patient reports diet high in fast food intake which largely consists of fried foods. Patient wants to work on reducing this. Will consider increasing dose of pravastatin if LDL remains elevated at next check despite dietary change.  Patient Goals/Self-Care Activities Patient will:  Take medications as prescribed Check blood sugar three times a day at the following times: fasting (at least 8 hours since last food consumption), 1-2 hours after a meal, bedtime, and whenever patient experiences symptoms of hypo/hyperglycemia, document, and provide at future appointments Check blood pressure at least once daily, document, and provide at future appointments Engage in dietary modifications by less frequent dining out, decreased fat intake, and fewer sweetened foods &  beverages  Follow Up Plan: Telephone follow up appointment with care management team member scheduled for: 02/20/21      Medication Assistance: None required.  Patient affirms current coverage meets needs.  Patient's preferred pharmacy is:  [facility], [place] [address] Phone: [phone] Fax: [phone]  Follow Up:  Patient agrees to Care Plan and Follow-up.  Plan: Telephone follow up appointment with care management team member scheduled for:  02/20/21  Kennon Holter, PharmD, [place] Pharmacist [place] [phone]

## 2021-02-01 DIAGNOSIS — E785 Hyperlipidemia, unspecified: Secondary | ICD-10-CM | POA: Diagnosis not present

## 2021-02-01 DIAGNOSIS — I10 Essential (primary) hypertension: Secondary | ICD-10-CM

## 2021-02-01 DIAGNOSIS — E1169 Type 2 diabetes mellitus with other specified complication: Secondary | ICD-10-CM

## 2021-02-01 DIAGNOSIS — Z794 Long term (current) use of insulin: Secondary | ICD-10-CM

## 2021-02-01 DIAGNOSIS — Z7984 Long term (current) use of oral hypoglycemic drugs: Secondary | ICD-10-CM | POA: Diagnosis not present

## 2021-02-06 ENCOUNTER — Encounter: Payer: Self-pay | Admitting: Family Medicine

## 2021-02-10 ENCOUNTER — Other Ambulatory Visit: Payer: Self-pay | Admitting: Family Medicine

## 2021-02-16 ENCOUNTER — Encounter: Payer: Self-pay | Admitting: Family Medicine

## 2021-02-17 NOTE — Telephone Encounter (Signed)
Patient currently working close with Gerald Stabs, clinical pharmacist

## 2021-02-20 ENCOUNTER — Ambulatory Visit (INDEPENDENT_AMBULATORY_CARE_PROVIDER_SITE_OTHER): Payer: Medicare PPO | Admitting: Pharmacist

## 2021-02-20 DIAGNOSIS — E119 Type 2 diabetes mellitus without complications: Secondary | ICD-10-CM

## 2021-02-20 DIAGNOSIS — E785 Hyperlipidemia, unspecified: Secondary | ICD-10-CM

## 2021-02-20 DIAGNOSIS — I10 Essential (primary) hypertension: Secondary | ICD-10-CM

## 2021-02-20 MED ORDER — NOVOLOG FLEXPEN 100 UNIT/ML ~~LOC~~ SOPN: 10.0000 [IU] | PEN_INJECTOR | Freq: Three times a day (TID) | SUBCUTANEOUS | 1 refills | Status: DC

## 2021-02-20 MED ORDER — GLUCOSE BLOOD VI STRP: ORAL_STRIP | 5 refills | Status: DC

## 2021-02-20 NOTE — Patient Instructions (Signed)
Lodema Pilot,  It was great to talk to you today!  Please call me with any questions or concerns.   Visit Information  Following are the goals we discussed today:  Patient Goals/Self-Care Activities Patient will:  Take medications as prescribed Check blood sugar three times a day at the following times: fasting (at least 8 hours since last food consumption), 1-2 hours after a meal, bedtime, and whenever patient experiences symptoms of hypo/hyperglycemia, document, and provide at future appointments Check blood pressure at least once daily, document, and provide at future appointments Engage in dietary modifications by less frequent dining out, decreased fat intake, and fewer sweetened foods & beverages  Plan: Face to Face appointment with care management team member scheduled for: 02/24/21  Kennon Holter, PharmD, BCACP, CPP Clinical Pharmacist Practitioner [place] [phone]  Please call the care guide team at [phone] if you need to cancel or reschedule your appointment.   Patient verbalizes understanding of instructions provided today and agrees to view in [place].

## 2021-02-20 NOTE — Chronic Care Management (AMB) (Signed)
Chronic Care Management Pharmacy Note  02/20/2021 Name:  [name] MRN:  [id number] DOB:  1955-12-30  Summary: Type 2 Diabetes Uncontrolled; Most recent A1c above goal of <7% per ADA guidelines Current medications: metformin 500 mg by mouth twice daily with meals, dulaglutide (Trulicity) 1.5 mg subcutaneously weekly, and insulin degludec Tyler Aas) 50 units subcutaneously at bedtime and insulin aspart (Novolog) 6 units subcutaneously 5-15 minutes before each meal Taking medications as directed: no, patient reports taking Novolog 10 units subcutaneously three time daily. Reports this number was easier for her to remember. Patient reports sometimes she forgets to take immediately before her meal but will take during or immediately after her meal when she remembers; is working on getting in the habit of using mealtime insulin before each meal. Blood glucose report send via MyChart to PCP on 02/16/21; fasting BG range significantly improved over last week with most blood glucose within the goal of 80-130 per ADA guideliens; post-prandial/bedtime blood glucose range has also improved to 180s-200s which is still slightly above goal of <180 per ADA guidelines Continue metformin 500 mg by mouth twice daily with meals. Patient aware to switch to XR formulation with next refill at pharmacy.  Continue dulaglutide (Trulicity) 1.5 mg subcutaneously weekly for now. Patient has 4 more pens. May consider increasing dose to 3 mg weekly instead of increasing mealtime insulin if post-prandial blood glucose continues to be elevated. Continue insulin degludec Tyler Aas) 50 units subcutaneously at bedtime since fasting blood glucose now within goal range.  Increase insulin aspart (Novolog) to 10 units subcutaneously 5-15 minutes before each meal since patient already self-increased to this and blood glucose under improved control without hypoglycemia.  Prescription sent to pharmacy for test strips to test blood  glucose three time daily. Patient identified as a good candidate for continuous glucose monitor. Recommend Dexcom G6. Prescription will be sent to DME supplier. Patient has compatible smart phone and will not need a reader device.  Will have patient return to clinic on Friday to check POC A1c and place Freestyle [place] 3 sample followed by review of blood glucose 2 weeks after to make further adjustments to regimen if needed.  Subjective: [name] is an 65 y.o. year old female who is a primary patient of Luking, Elayne Snare, MD.  The CCM team was consulted for assistance with disease management and care coordination needs.    Engaged with patient by telephone for follow up visit in response to provider referral for pharmacy case management and/or care coordination services.   Consent to Services:  The patient was given information about Chronic Care Management services, agreed to services, and gave verbal consent prior to initiation of services.  Please see initial visit note for detailed documentation.   Patient Care Team: Kathyrn Drown, MD as PCP - General (Family Medicine) Satira Sark, MD as PCP - Cardiology (Cardiology) Danie Binder, MD (Inactive) as Consulting Physician (Gastroenterology) Beryle Lathe, [facility] (Pharmacist)  Objective:  Lab Results  Component Value Date   CREATININE 1.02 (H) 09/23/2020   CREATININE 1.04 (H) 05/04/2020   CREATININE 1.07 (H) 11/26/2019    Lab Results  Component Value Date   HGBA1C 10.8 (H) 09/23/2020   Last diabetic Eye exam:  Lab Results  Component Value Date/Time   HMDIABEYEEXA Retinopathy (A) 04/26/2020 12:00 AM    Last diabetic Foot exam: No results found for: HMDIABFOOTEX      Component Value Date/Time   CHOL 159 09/23/2020 1036  TRIG 173 (H) 09/23/2020 1036   HDL 49 09/23/2020 1036   CHOLHDL 3.2 09/23/2020 1036   CHOLHDL 2.5 07/02/2014 0714   VLDL 35 07/02/2014 0714   LDLCALC 81 09/23/2020 1036    Hepatic  Function Latest Ref Rng & Units 09/23/2020 11/26/2019 03/26/2019  Total Protein 6.0 - 8.5 g/dL 6.6 6.7 6.6  Albumin 3.8 - 4.8 g/dL 4.3 4.3 4.1  AST 0 - 40 IU/L '17 19 22  ' ALT 0 - 32 IU/L '14 17 15  ' Alk Phosphatase 44 - 121 IU/L 102 101 92  Total Bilirubin 0.0 - 1.2 mg/dL 0.5 0.6 0.4  Bilirubin, Direct 0.00 - 0.40 mg/dL 0.16 - 0.15    Lab Results  Component Value Date/Time   TSH 1.958 09/14/2013 12:01 AM   TSH 1.711 09/19/2012 10:20 AM   FREET4 1.37 09/14/2013 12:01 AM    CBC Latest Ref Rng & Units 11/26/2019 01/07/2018 07/03/2017  WBC 3.4 - 10.8 x10E3/uL 9.4 8.1 10.4  Hemoglobin 11.1 - 15.9 g/dL 13.4 12.7 13.6  Hematocrit 34.0 - 46.6 % 40.0 38.0 41.3  Platelets 150 - 450 x10E3/uL 358 313 319    No results found for: VD25OH  Clinical ASCVD: No  The 10-year ASCVD risk score (Arnett DK, et al., 2019) is: 15.2%   Values used to calculate the score:     Age: 25 years     Sex: Female     Is Non-Hispanic African American: No     Diabetic: Yes     Tobacco smoker: No     Systolic Blood Pressure: 030 mmHg     Is BP treated: Yes     HDL Cholesterol: 49 mg/dL     Total Cholesterol: 159 mg/dL    Social History   Tobacco Use  Smoking Status Never  Smokeless Tobacco Never   BP Readings from Last 3 Encounters:  09/09/20 124/72  03/28/20 124/70  03/24/20 130/82   Pulse Readings from Last 3 Encounters:  03/28/20 (!) 118  03/24/20 78  03/15/20 77   Wt Readings from Last 3 Encounters:  09/09/20 249 lb 6.4 oz (113.1 kg)  03/28/20 247 lb (112 kg)  03/24/20 249 lb 6.4 oz (113.1 kg)    Assessment: Review of patient past medical history, allergies, medications, health status, including review of consultants reports, laboratory and other test data, was performed as part of comprehensive evaluation and provision of chronic care management services.   SDOH:  (Social Determinants of Health) assessments and interventions performed:    CCM Care Plan  No Known Allergies  Medications  Reviewed Today     Reviewed by Beryle Lathe, [facility] (Pharmacist) on 02/20/21 at [place] List Status: <None>   Medication Order Taking? Sig Documenting Provider Last Dose Status Informant  acetaminophen (TYLENOL) 500 MG tablet [id number] Yes Take 1,000 mg by mouth every 6 (six) hours as needed for moderate pain or headache. [provider] Taking Active Self  ALPRAZolam Duanne Moron) 0.5 MG tablet [id number] Yes TAKE 1/2 TO 1 TABLET BY MOUTH TWICE DAILY AS NEEDED. Kathyrn Drown, MD Taking Active   amLODipine (NORVASC) 10 MG tablet [id number] Yes TAKE ONE TABLET BY MOUTH DAILY Kathyrn Drown, MD Taking Active   aspirin 81 MG tablet [id number] Yes Take 81 mg by mouth daily. [provider] Taking Active Self  insulin aspart (NOVOLOG FLEXPEN) 100 UNIT/ML FlexPen [id number] Yes Inject 6 Units into the skin 3 (three) times daily with meals.  Patient taking differently: Inject 10 Units into  the skin 3 (three) times daily with meals.   Kathyrn Drown, MD Taking Active   insulin degludec (TRESIBA) 100 UNIT/ML FlexTouch Pen [id number] Yes Inject 50 Units into the skin at bedtime. Kathyrn Drown, MD Taking Active   Insulin Pen Needle (SURE COMFORT PEN NEEDLES) 31G X 5 MM MISC [id number] Yes Use to inject insulin four times daily Luking, Elayne Snare, MD Taking Active   Lancets (ONETOUCH DELICA PLUS [id number]) [employer] [id number]  USE AS DIRECTED. Kathyrn Drown, MD  Active   lisinopril (ZESTRIL) 40 MG tablet [id number] Yes TAKE ONE TABLET BY MOUTH ONCE DAILY. Kathyrn Drown, MD Taking Active   metFORMIN (GLUCOPHAGE) 500 MG tablet [id number] Yes Take 0.5 tablets (250 mg total) by mouth 2 (two) times daily with a meal for 14 days, THEN 1 tablet (500 mg total) 2 (two) times daily with a meal for 16 days. Then switch to metformin XR 500 mg twice daily. Kathyrn Drown, MD Taking Active   metFORMIN (GLUCOPHAGE-XR) 500 MG 24 hr tablet [id number] No Take 1 tablet (500 mg total) by mouth 2 (two) times daily  with a meal.  Patient not taking: Reported on 02/20/2021   Kathyrn Drown, MD Not Taking Active   metoprolol tartrate (LOPRESSOR) 50 MG tablet [id number] Yes 1 bid Kathyrn Drown, MD Taking Active   pravastatin (PRAVACHOL) 20 MG tablet [id number] Yes TAKE 1 TABLET BY MOUTH AT BEDTIME FOR CHOLESTEROL. Kathyrn Drown, MD Taking Active   TRULICITY 1.5 HE/1.7EY [place] Aid [id number] Yes INJECT 0.5MLS INTO THE SKIN ONCE A WEEK Kathyrn Drown, MD Taking Active   warfarin (COUMADIN) 5 MG tablet [id number] Yes TAKE 1/2 TABLET BY MOUTH DAILY EXCEPT 1 TABLET ON MONDAYS, [place] AND FRIDAYS OR AS DIRECTED Satira Sark, MD Taking Active             Patient Active Problem List   Diagnosis Date Noted   Visit for suture removal 03/28/2020   Laceration of right little finger 03/24/2020   Hyperlipidemia associated with type 2 diabetes mellitus ([place]) 12/08/2019   Encounter for screening colonoscopy 12/09/2017   Preoperative clearance 09/30/2017   ( 06/04/2016   Encounter for therapeutic drug monitoring 01/12/2016   S/P AVR 01/03/2016   Insomnia 06/15/2015   Thyroid nodule 09/15/2012   Renal insufficiency 09/15/2012   Aortic stenosis    Type 2 diabetes mellitus not at goal [facility])    Hypertension    Hyperlipidemia    Restless leg syndrome    Ruptured disk     Immunization History  Administered Date(s) Administered   Fluad Quad(high Dose 65+) 12/08/2019, 12/28/2020   Influenza Split 12/18/2012   Influenza,inj,Quad PF,6+ Mos 11/30/2013, 12/30/2014, 01/10/2016, 11/26/2016, 01/07/2018, 12/26/2018   Influenza-Unspecified 02/03/2012, 01/07/2018   Moderna Sars-Covid-2 Vaccination 10/08/2019, 11/09/2019   Pneumococcal Conjugate-13 08/27/2019   Pneumococcal Polysaccharide-23 09/03/2007, 01/10/2016   Tdap 03/15/2020   Zoster Recombinat (Shingrix) 11/23/2019, 05/04/2020    Conditions to be addressed/monitored: HTN, HLD, and DMII  Care Plan : Medication Management  Updates made by Beryle Lathe, [place] since 02/20/2021 12:00 AM     Problem: T2DM, HTN, HLD   Priority: High  Onset Date: 12/26/2020     Long-Range Goal: Disease Progression Prevention   Start Date: 12/26/2020  Expected End Date: 03/26/2021  Recent Progress: On track  Priority: High  Note:   Current Barriers:  Unable to independently monitor therapeutic efficacy Unable to achieve control of diabetes and hyperlipidemia  Pharmacist Clinical Goal(s):  Patient  will achieve adherence to monitoring guidelines and medication adherence to achieve therapeutic efficacy achieve control of diabetes and hyperlipidemia as evidenced by improved fasting blood sugar, improved A1c, improved LDL, and improved triglycerides adhere to plan to optimize therapeutic regimen for diabetes as evidenced by report of adherence to recommended medication management changes through collaboration with PharmD and provider.   Interventions: 1:1 collaboration with Kathyrn Drown, MD regarding development and update of comprehensive plan of care as evidenced by provider attestation and co-signature Inter-disciplinary care team collaboration (see longitudinal plan of care) Comprehensive medication review performed; medication list updated in electronic medical record  Type 2 Diabetes - Goal on Track (progressing): YES.: Uncontrolled; Most recent A1c above goal of <7% per ADA guidelines Current medications: metformin 500 mg by mouth twice daily with meals, dulaglutide (Trulicity) 1.5 mg subcutaneously weekly, and insulin degludec Tyler Aas) 50 units subcutaneously at bedtime and insulin aspart (Novolog) 6 units subcutaneously 5-15 minutes before each meal Intolerances: none Taking medications as directed: no, patient reports taking Novolog 10 units subcutaneously three time daily. Reports this number was easier for her to remember. Patient reports sometimes she forgets to take immediately before her meal but will take during or immediately  after her meal when she remembers; is working on getting in the habit of using mealtime insulin before each meal. Side effects thought to be attributed to current medication regimen: no Denies hypoglycemic symptoms (sweaty and shaky). No hypoglycemia on blood glucose log.  Hypoglycemia prevention: not indicated at this time Current meal patterns: not discussed today Current exercise: not discussed today On a statin: yes On aspirin 81 mg daily: yes Last microalbumin/creatinine ratio: 21 (09/23/20); on an ACEi/ARB: yes Last eye exam: completed within last year Last foot exam: completed within last year Pneumonia vaccine: overdue, needs single dose of PPSV23 or PCV20. PCV13 received July 2021 Influenza vaccine: up to date Shingrix: series complete Current glucose readings:  patient has been checking blood glucose 2-3 times per day; BG report send via MyChart to PCP on 02/16/21; fasting BG range significantly improved over last week with most blood glucose within the goal of 80-130 per ADA guideliens; post-prandial/bedtime blood glucose range has also improved to 180s-200s which is still slightly above goal of <180 per ADA guidelines Instructed to monitor blood sugars three times a day at the following times: fasting (at least 8 hours since last food consumption), 1-2 hours after a meal, bedtime, and whenever patient experiences symptoms of hypo/hyperglycemia  Discussed management of hypoglycemia. If blood sugar <70 at any time, treat with simple sugar such as 1/2 cup juice or regular soda or 3-4 glucose tablets. Recheck blood sugar in 15 minutes and repeat if blood sugar remains <70.  Continue metformin 500 mg by mouth twice daily with meals. Patient aware to switch to XR formulation with next refill at pharmacy.  Continue dulaglutide (Trulicity) 1.5 mg subcutaneously weekly for now. Patient has 4 more pens. May consider increasing dose to 3 mg weekly instead of increasing mealtime insulin if  post-prandial blood glucose continues to be elevated. Continue insulin degludec Tyler Aas) 50 units subcutaneously at bedtime since fasting blood glucose now within goal range.  Increase insulin aspart (Novolog) to 10 units subcutaneously 5-15 minutes before each meal since patient already self-increased to this and blood glucose under improved control without hypoglycemia.  Prescription sent to pharmacy for test strips to test blood glucose three time daily. Patient identified as a good candidate for continuous glucose monitor. Recommend Dexcom G6. Prescription will be  sent to DME supplier. Patient has compatible smart phone and will not need a reader device.  Will have patient return to clinic on Friday to check POC A1c and place Freestyle [place] 3 sample followed by review of blood glucose 2 weeks after to make further adjustments to regimen if needed.  Hypertension - Condition stable. Not addressed this visit.: Blood pressure under good control. Blood pressure is at goal of <130/80 mmHg per 2017 AHA/ACC guidelines. Current medications: lisinopril 40 mg by mouth once daily, amlodipine 10 mg by mouth once daily, and metoprolol tartrate 50 mg by mouth twice daily Intolerances: none Taking medications as directed: yes Side effects thought to be attributed to current medication regimen: no Current home blood pressure: patient has a blood pressure machine but does not currently check Continue lisinopril 40 mg by mouth once daily, amlodipine 10 mg by mouth once daily, and metoprolol tartrate 50 mg by mouth twice daily Encourage dietary sodium restriction/DASH diet Recommend home blood pressure monitoring to discuss at next visit  Hyperlipidemia - Condition stable. Not addressed this visit.: Uncontrolled. LDL above goal of <70 due to very high risk given diabetes + at least 1 additional major risk factor (hypertension) per 2020 AACE/ACE guidelines. Triglycerides above goal of <150 per 2020 AACE/ACE  guidelines. Current medications: pravastatin 20 mg by mouth once daily Intolerances: none Taking medications as directed: yes Side effects thought to be attributed to current medication regimen: no Continue pravastatin 20 mg by mouth once daily Encourage dietary reduction of high fat containing foods such as butter, nuts, bacon, egg yolks, etc. Re-check lipid panel in 4-12 weeks Patient reports diet high in fast food intake which largely consists of fried foods. Patient wants to work on reducing this. Will consider increasing dose of pravastatin if LDL remains elevated at next check despite dietary change.  Patient Goals/Self-Care Activities Patient will:  Take medications as prescribed Check blood sugar three times a day at the following times: fasting (at least 8 hours since last food consumption), 1-2 hours after a meal, bedtime, and whenever patient experiences symptoms of hypo/hyperglycemia, document, and provide at future appointments Check blood pressure at least once daily, document, and provide at future appointments Engage in dietary modifications by less frequent dining out, decreased fat intake, and fewer sweetened foods & beverages  Follow Up Plan: Face to Face appointment with care management team member scheduled for: 02/24/21      Medication Assistance: None required.  Patient affirms current coverage meets needs.  Patient's preferred pharmacy is:  [facility], [address] [place] [address] Phone: [phone] Fax: [phone]  Follow Up:  Patient agrees to Care Plan and Follow-up.  Plan: Face to Face appointment with care management team member scheduled for: 02/24/21  Kennon Holter, PharmD, BCACP, CPP Clinical Pharmacist Practitioner [place] [phone]

## 2021-02-22 ENCOUNTER — Ambulatory Visit (INDEPENDENT_AMBULATORY_CARE_PROVIDER_SITE_OTHER): Payer: Medicare PPO | Admitting: *Deleted

## 2021-02-22 DIAGNOSIS — Z5181 Encounter for therapeutic drug level monitoring: Secondary | ICD-10-CM | POA: Diagnosis not present

## 2021-02-22 DIAGNOSIS — Z952 Presence of prosthetic heart valve: Secondary | ICD-10-CM

## 2021-02-22 LAB — POCT INR: INR: 1.4 — AB (ref 2.0–3.0)

## 2021-02-22 NOTE — Patient Instructions (Signed)
Take warfarin 1 1/2 tablets tonight and tomorrow night then resume 1/2 tablet daily except 1 tablet on Mondays and Thursdays Recheck in 2 weeks  Continue greens

## 2021-02-24 ENCOUNTER — Other Ambulatory Visit: Payer: Self-pay

## 2021-02-24 ENCOUNTER — Ambulatory Visit: Payer: Medicare PPO | Admitting: Pharmacist

## 2021-02-24 DIAGNOSIS — E119 Type 2 diabetes mellitus without complications: Secondary | ICD-10-CM | POA: Diagnosis not present

## 2021-02-24 DIAGNOSIS — I10 Essential (primary) hypertension: Secondary | ICD-10-CM

## 2021-02-24 DIAGNOSIS — E785 Hyperlipidemia, unspecified: Secondary | ICD-10-CM

## 2021-02-24 MED ORDER — FREESTYLE LIBRE 3 SENSOR MISC: 1.0000 | 0 refills | Status: DC

## 2021-02-24 MED ORDER — ACCU-CHEK GUIDE W/DEVICE KIT: PACK | 0 refills | Status: AC

## 2021-02-24 NOTE — Chronic Care Management (AMB) (Signed)
Chronic Care Management Pharmacy Note  02/24/2021 Name:  [name] MRN:  [id number] DOB:  Jul 11, 1955  Summary: Type 2 Diabetes Instruction, training, and placement of Freestyle Libre 3 sample completed today in office. Will follow-up with review of blood glucose in a few weeks to make further adjustments to regimen as needed. Order for Dexcom G6 still pending. Patient reports she needs to call [facility] back since they left her a message recently. Patient was given their contact information today. Instructed to monitor blood sugars continuously with continuous glucose monitor. Patient instructed to check finger stick blood glucose if symptoms do not match sensor glucose. No medication changes today A1c ordered today. Patient instructed to visit [facility] to get drawn.  Upon physical exam of abdomen today in office, patient has some bruising and scarring where she has been injecting her insulin. Patient reminded to alternate locations on abdomen when injecting insulin. Patient also instructed to allow that area to heal before injecting there again and to use other subcutaneously locations in the meantime.  Prescription dispensed from last visit for test strips at pharmacy were for Accu-Chek guide test strips but patient reports she does not have Accu-Chek machine. Rx sent in for Accu-Chek device today to use with test strips that she has.   Hyperlipidemia Patient reports diet high in fast food intake which largely consists of fried foods. Patient wants to work on reducing this. Will consider increasing dose of pravastatin if LDL remains elevated at next check despite dietary change.  Subjective: [name] is an 65 y.o. year old female who is a primary patient of Luking, Elayne Snare, MD.  The CCM team was consulted for assistance with disease management and care coordination needs.    Engaged with patient face to face for follow up visit in response to provider referral for  pharmacy case management and/or care coordination services.   Consent to Services:  The patient was given information about Chronic Care Management services, agreed to services, and gave verbal consent prior to initiation of services.  Please see initial visit note for detailed documentation.   Patient Care Team: Kathyrn Drown, MD as PCP - General (Family Medicine) Satira Sark, MD as PCP - Cardiology (Cardiology) Danie Binder, MD (Inactive) as Consulting Physician (Gastroenterology) Beryle Lathe, [facility] (Pharmacist)  Objective:  Lab Results  Component Value Date   CREATININE 1.02 (H) 09/23/2020   CREATININE 1.04 (H) 05/04/2020   CREATININE 1.07 (H) 11/26/2019    Lab Results  Component Value Date   HGBA1C 10.8 (H) 09/23/2020   Last diabetic Eye exam:  Lab Results  Component Value Date/Time   HMDIABEYEEXA Retinopathy (A) 04/26/2020 12:00 AM    Last diabetic Foot exam: No results found for: HMDIABFOOTEX      Component Value Date/Time   CHOL 159 09/23/2020 1036   TRIG 173 (H) 09/23/2020 1036   HDL 49 09/23/2020 1036   CHOLHDL 3.2 09/23/2020 1036   CHOLHDL 2.5 07/02/2014 0714   VLDL 35 07/02/2014 0714   LDLCALC 81 09/23/2020 1036    Hepatic Function Latest Ref Rng & Units 09/23/2020 11/26/2019 03/26/2019  Total Protein 6.0 - 8.5 g/dL 6.6 6.7 6.6  Albumin 3.8 - 4.8 g/dL 4.3 4.3 4.1  AST 0 - 40 IU/L '17 19 22  ' ALT 0 - 32 IU/L '14 17 15  ' Alk Phosphatase 44 - 121 IU/L 102 101 92  Total Bilirubin 0.0 - 1.2 mg/dL 0.5 0.6 0.4  Bilirubin, Direct  0.00 - 0.40 mg/dL 0.16 - 0.15    Lab Results  Component Value Date/Time   TSH 1.958 09/14/2013 12:01 AM   TSH 1.711 09/19/2012 10:20 AM   FREET4 1.37 09/14/2013 12:01 AM    CBC Latest Ref Rng & Units 11/26/2019 01/07/2018 07/03/2017  WBC 3.4 - 10.8 x10E3/uL 9.4 8.1 10.4  Hemoglobin 11.1 - 15.9 g/dL 13.4 12.7 13.6  Hematocrit 34.0 - 46.6 % 40.0 38.0 41.3  Platelets 150 - 450 x10E3/uL 358 313 319    No results  found for: VD25OH  Clinical ASCVD: No  The 10-year ASCVD risk score (Arnett DK, et al., 2019) is: 15.2%   Values used to calculate the score:     Age: 77 years     Sex: Female     Is Non-Hispanic African American: No     Diabetic: Yes     Tobacco smoker: No     Systolic Blood Pressure: 892 mmHg     Is BP treated: Yes     HDL Cholesterol: 49 mg/dL     Total Cholesterol: 159 mg/dL    Social History   Tobacco Use  Smoking Status Never  Smokeless Tobacco Never   BP Readings from Last 3 Encounters:  09/09/20 124/72  03/28/20 124/70  03/24/20 130/82   Pulse Readings from Last 3 Encounters:  03/28/20 (!) 118  03/24/20 78  03/15/20 77   Wt Readings from Last 3 Encounters:  09/09/20 249 lb 6.4 oz (113.1 kg)  03/28/20 247 lb (112 kg)  03/24/20 249 lb 6.4 oz (113.1 kg)    Assessment: Review of patient past medical history, allergies, medications, health status, including review of consultants reports, laboratory and other test data, was performed as part of comprehensive evaluation and provision of chronic care management services.   SDOH:  (Social Determinants of Health) assessments and interventions performed:    CCM Care Plan  No Known Allergies  Medications Reviewed Today     Reviewed by Beryle Lathe, [facility] (Pharmacist) on 02/24/21 at 1035  Med List Status: <None>   Medication Order Taking? Sig Documenting Provider Last Dose Status Informant  acetaminophen (TYLENOL) 500 MG tablet [id number] Yes Take 1,000 mg by mouth every 6 (six) hours as needed for moderate pain or headache. [provider] Taking Active Self  ALPRAZolam Duanne Moron) 0.5 MG tablet [id number] Yes TAKE 1/2 TO 1 TABLET BY MOUTH TWICE DAILY AS NEEDED. Kathyrn Drown, MD Taking Active   amLODipine (NORVASC) 10 MG tablet [id number] Yes TAKE ONE TABLET BY MOUTH DAILY Kathyrn Drown, MD Taking Active   aspirin 81 MG tablet [id number] Yes Take 81 mg by mouth daily. [provider] Taking  Active Self  glucose blood test strip [id number] Yes Use as instructed to test blood glucose three time daily Kathyrn Drown, MD Taking Active            Med Note Waldo Laine, Gwenyth Allegra   Fri Feb 24, 2021 10:31 AM) Accu-Chek  insulin aspart (NOVOLOG FLEXPEN) 100 UNIT/ML FlexPen [id number] Yes Inject 10 Units into the skin 3 (three) times daily with meals. Kathyrn Drown, MD Taking Active   insulin degludec (TRESIBA) 100 UNIT/ML FlexTouch Pen [id number] Yes Inject 50 Units into the skin at bedtime. Kathyrn Drown, MD Taking Active   Insulin Pen Needle (SURE COMFORT PEN NEEDLES) 31G X 5 MM MISC [id number]  Use to inject insulin four times daily Luking, Elayne Snare, MD  Active   Lancets (ONETOUCH DELICA PLUS SFKCLE75T) [place]  [id number]  USE AS DIRECTED. Kathyrn Drown, MD  Active   lisinopril (ZESTRIL) 40 MG tablet [id number] Yes TAKE ONE TABLET BY MOUTH ONCE DAILY. Kathyrn Drown, MD Taking Active   metFORMIN (GLUCOPHAGE-XR) 500 MG 24 hr tablet [id number] Yes Take 1 tablet (500 mg total) by mouth 2 (two) times daily with a meal. Luking, Elayne Snare, MD Taking Active   metoprolol tartrate (LOPRESSOR) 50 MG tablet [id number] Yes 1 bid Kathyrn Drown, MD Taking Active   pravastatin (PRAVACHOL) 20 MG tablet [id number] Yes TAKE 1 TABLET BY MOUTH AT BEDTIME FOR CHOLESTEROL. Kathyrn Drown, MD Taking Active   TRULICITY 1.5 MV/3.6PQ [place] [id number] Yes INJECT 0.5MLS INTO THE SKIN ONCE A WEEK Kathyrn Drown, MD Taking Active   warfarin (COUMADIN) 5 MG tablet [id number] Yes TAKE 1/2 TABLET BY MOUTH DAILY EXCEPT 1 TABLET ON MONDAYS, [place] AND FRIDAYS OR AS DIRECTED Satira Sark, MD Taking Active             Patient Active Problem List   Diagnosis Date Noted   Visit for suture removal 03/28/2020   Laceration of right little finger 03/24/2020   Hyperlipidemia associated with type 2 diabetes mellitus ([place]) 12/08/2019   Encounter for screening colonoscopy 12/09/2017   Preoperative clearance 09/30/2017   (  06/04/2016   Encounter for therapeutic drug monitoring 01/12/2016   S/P AVR 01/03/2016   Insomnia 06/15/2015   Thyroid nodule 09/15/2012   Renal insufficiency 09/15/2012   Aortic stenosis    Type 2 diabetes mellitus not at goal [facility])    Hypertension    Hyperlipidemia    Restless leg syndrome    Ruptured disk     Immunization History  Administered Date(s) Administered   Fluad Quad(high Dose 65+) 12/08/2019, 12/28/2020   Influenza Split 12/18/2012   Influenza,inj,Quad PF,6+ Mos 11/30/2013, 12/30/2014, 01/10/2016, 11/26/2016, 01/07/2018, 12/26/2018   Influenza-Unspecified 02/03/2012, 01/07/2018   Moderna Sars-Covid-2 Vaccination 10/08/2019, 11/09/2019   Pneumococcal Conjugate-13 08/27/2019   Pneumococcal Polysaccharide-23 09/03/2007, 01/10/2016   Tdap 03/15/2020   Zoster Recombinat (Shingrix) 11/23/2019, 05/04/2020    Conditions to be addressed/monitored: HTN, HLD, and DMII  Care Plan : Medication Management  Updates made by Beryle Lathe, [place] since 02/24/2021 12:00 AM     Problem: T2DM, HTN, HLD   Priority: High  Onset Date: 12/26/2020     Long-Range Goal: Disease Progression Prevention   Start Date: 12/26/2020  Expected End Date: 03/26/2021  Recent Progress: On track  Priority: High  Note:   Current Barriers:  Unable to independently monitor therapeutic efficacy Unable to achieve control of diabetes and hyperlipidemia  Pharmacist Clinical Goal(s):  Patient will achieve adherence to monitoring guidelines and medication adherence to achieve therapeutic efficacy achieve control of diabetes and hyperlipidemia as evidenced by improved fasting blood sugar, improved A1c, improved LDL, and improved triglycerides adhere to plan to optimize therapeutic regimen for diabetes as evidenced by report of adherence to recommended medication management changes through collaboration with PharmD and provider.   Interventions: 1:1 collaboration with Kathyrn Drown, MD  regarding development and update of comprehensive plan of care as evidenced by provider attestation and co-signature Inter-disciplinary care team collaboration (see longitudinal plan of care) Comprehensive medication review performed; medication list updated in electronic medical record  Type 2 Diabetes - Goal on Track (progressing): YES.: Uncontrolled; Most recent A1c above goal of <7% per ADA guidelines Current medications: metformin 500 mg by mouth twice daily with meals, dulaglutide (Trulicity) 1.5 mg subcutaneously weekly, and insulin degludec (  Tyler Aas) 50 units subcutaneously at bedtime and insulin aspart (Novolog) 10 units subcutaneously 5-15 minutes before each meal Intolerances: none Taking medications as directed: yes Side effects thought to be attributed to current medication regimen: no Denies hypoglycemic symptoms (sweaty and shaky). No hypoglycemia on blood glucose log.  Hypoglycemia prevention: not indicated at this time Current meal patterns: not discussed today Current exercise: not discussed today On a statin: yes On aspirin 81 mg daily: yes Last microalbumin/creatinine ratio: 21 (09/23/20); on an ACEi/ARB: yes Last eye exam: completed within last year Last foot exam: completed within last year Pneumonia vaccine: overdue, needs single dose of PPSV23 or PCV20. PCV13 received July 2021 Influenza vaccine: up to date Shingrix: series complete Current glucose readings:  not discussed today. See prior notes for blood glucose trends Instruction, training, and placement of Freestyle Libre 3 sample completed today in office. Will follow-up with review of blood glucose in a few weeks to make further adjustments to regimen as needed. Order for Dexcom G6 still pending. Patient reports she needs to call [employer] [facility] [employer] back since they left her a message recently. Patient was given their contact information today. Instructed to monitor blood sugars continuously with continuous  glucose monitor. Patient instructed to check finger stick blood glucose if symptoms do not match sensor glucose. Discussed management of hypoglycemia. If blood sugar <70 at any time, treat with simple sugar such as 1/2 cup juice or regular soda or 3-4 glucose tablets. Recheck blood sugar in 15 minutes and repeat if blood sugar remains <70.  Continue metformin 500 mg by mouth twice daily with meals. Continue dulaglutide (Trulicity) 1.5 mg subcutaneously weekly for now. Patient has a few more pens. May consider increasing dose to 3 mg weekly instead of increasing mealtime insulin if post-prandial blood glucose continues to be elevated. Continue insulin degludec Tyler Aas) 50 units subcutaneously at bedtime since fasting blood glucose now within goal range.  Continue insulin aspart (Novolog) 10 units subcutaneously 5-15 minutes before each meal  Patient identified as a good candidate for continuous glucose monitor. Recommend Dexcom G6. Prescription pending with DME supplier. Patient has compatible smart phone and will not need a reader device.  A1c ordered today. Patient instructed to visit [facility] to get drawn.  Upon physical exam of abdomen today in office, patient has some bruising and scarring where she has been injecting her insulin. Patient reminded to alternate locations on abdomen when injecting insulin. Patient also instructed to allow that area to heal before injecting there again and to use other subcutaneously locations in the meantime.  Prescription dispensed from last visit for test strips at pharmacy were for Accu-Chek guide test strips but patient reports she does not have Accu-Chek machine. Rx sent in for Accu-Chek device today to use with test strips that she has.   Hypertension - Condition stable. Not addressed this visit.: Blood pressure under good control. Blood pressure is at goal of <130/80 mmHg per 2017 AHA/ACC guidelines. Current medications: lisinopril 40 mg by mouth once daily,  amlodipine 10 mg by mouth once daily, and metoprolol tartrate 50 mg by mouth twice daily Intolerances: none Taking medications as directed: yes Side effects thought to be attributed to current medication regimen: no Current home blood pressure: patient has a blood pressure machine but does not currently check Continue lisinopril 40 mg by mouth once daily, amlodipine 10 mg by mouth once daily, and metoprolol tartrate 50 mg by mouth twice daily Encourage dietary sodium restriction/DASH diet Recommend home blood pressure monitoring to  discuss at next visit  Hyperlipidemia - Condition stable. Not addressed this visit.: Uncontrolled. LDL above goal of <70 due to very high risk given diabetes + at least 1 additional major risk factor (hypertension) per 2020 AACE/ACE guidelines. Triglycerides above goal of <150 per 2020 AACE/ACE guidelines. Current medications: pravastatin 20 mg by mouth once daily Intolerances: none Taking medications as directed: yes Side effects thought to be attributed to current medication regimen: no Continue pravastatin 20 mg by mouth once daily Encourage dietary reduction of high fat containing foods such as butter, nuts, bacon, egg yolks, etc. Re-check lipid panel in 4-12 weeks Patient reports diet high in fast food intake which largely consists of fried foods. Patient wants to work on reducing this. Will consider increasing dose of pravastatin if LDL remains elevated at next check despite dietary change.  Patient Goals/Self-Care Activities Patient will:  Take medications as prescribed Check blood sugar continuously with continuous glucose monitor Check blood pressure at least once daily, document, and provide at future appointments Engage in dietary modifications by less frequent dining out, decreased fat intake, and fewer sweetened foods & beverages  Follow Up Plan: Telephone follow up appointment with care management team member scheduled for: 03/20/21       Medication Assistance: None required.  Patient affirms current coverage meets needs.  Patient's preferred pharmacy is:  [facility], [address] [place] [address] Phone: [phone] Fax: [phone]  Follow Up:  Patient agrees to Care Plan and Follow-up.  Plan: Telephone follow up appointment with care management team member scheduled for:  03/20/21  Kennon Holter, PharmD, BCACP, CPP Clinical Pharmacist Practitioner [place] [phone]

## 2021-02-24 NOTE — Patient Instructions (Signed)
[name],  It was great to talk to you today!  Please call me with any questions or concerns.   Visit Information  Following are the goals we discussed today:  Patient Goals/Self-Care Activities Patient will:  Take medications as prescribed Check blood sugar continuously with continuous glucose monitor Check blood pressure at least once daily, document, and provide at future appointments Engage in dietary modifications by less frequent dining out, decreased fat intake, and fewer sweetened foods & beverages  Plan: Telephone follow up appointment with care management team member scheduled for:  03/20/21  Kennon Holter, PharmD, BCACP, CPP Clinical Pharmacist Practitioner [place] [phone]  Please call the care guide team at [phone] if you need to cancel or reschedule your appointment.   Patient verbalizes understanding of instructions provided today and agrees to view in [place].

## 2021-02-25 LAB — HEMOGLOBIN A1C
Est. average glucose Bld gHb Est-mCnc: 206 mg/dL
Hgb A1c MFr Bld: 8.8 % — ABNORMAL HIGH (ref 4.8–5.6)

## 2021-03-04 DIAGNOSIS — E785 Hyperlipidemia, unspecified: Secondary | ICD-10-CM | POA: Diagnosis not present

## 2021-03-04 DIAGNOSIS — I10 Essential (primary) hypertension: Secondary | ICD-10-CM

## 2021-03-04 DIAGNOSIS — E119 Type 2 diabetes mellitus without complications: Secondary | ICD-10-CM | POA: Diagnosis not present

## 2021-03-07 ENCOUNTER — Ambulatory Visit (INDEPENDENT_AMBULATORY_CARE_PROVIDER_SITE_OTHER): Payer: Medicare PPO | Admitting: *Deleted

## 2021-03-07 DIAGNOSIS — Z5181 Encounter for therapeutic drug level monitoring: Secondary | ICD-10-CM

## 2021-03-07 DIAGNOSIS — Z952 Presence of prosthetic heart valve: Secondary | ICD-10-CM

## 2021-03-07 LAB — POCT INR: INR: 1.3 — AB (ref 2.0–3.0)

## 2021-03-07 NOTE — Patient Instructions (Signed)
Increase warfarin to 1 tablet daily except 1/2 tablet on Mondays, Wednesdays and Fridays Recheck in 2 weeks  Continue greens

## 2021-03-17 ENCOUNTER — Ambulatory Visit: Payer: Medicare PPO | Admitting: Family Medicine

## 2021-03-20 ENCOUNTER — Ambulatory Visit (INDEPENDENT_AMBULATORY_CARE_PROVIDER_SITE_OTHER): Payer: Medicare PPO | Admitting: Pharmacist

## 2021-03-20 DIAGNOSIS — I10 Essential (primary) hypertension: Secondary | ICD-10-CM

## 2021-03-20 DIAGNOSIS — E785 Hyperlipidemia, unspecified: Secondary | ICD-10-CM

## 2021-03-20 DIAGNOSIS — E119 Type 2 diabetes mellitus without complications: Secondary | ICD-10-CM

## 2021-03-20 MED ORDER — TRULICITY 3 MG/0.5ML ~~LOC~~ SOAJ: 3.0000 mg | SUBCUTANEOUS | 0 refills | Status: DC

## 2021-03-20 MED ORDER — NOVOLOG FLEXPEN 100 UNIT/ML ~~LOC~~ SOPN: 8.0000 [IU] | PEN_INJECTOR | Freq: Three times a day (TID) | SUBCUTANEOUS | 1 refills | Status: DC

## 2021-03-20 NOTE — Patient Instructions (Signed)
Lodema Pilot,  It was great to talk to you today!  Increase Trulicity to 3mg  subcutaneously once weekly Decrease Novolog to 8 units subcutaneously three time daily 5-15 minutes before meals. Skip dose if you skip a meal.  Please let me know if hypoglycemia (low blood sugar) occurs and we can discuss changes  Please call me with any questions or concerns.  Visit Information  Following are the goals we discussed today:   Goals Addressed             This Visit's Progress    Medication Management       Patient Goals/Self-Care Activities Patient will:  Take medications as prescribed Check blood sugar four times daily  Check blood pressure at least once daily, document, and provide at future appointments Engage in dietary modifications by less frequent dining out, decreased fat intake, and fewer sweetened foods & beverages          Follow-up plan: Face to Face appointment with care management team member scheduled for: 03/31/21  Patient verbalizes understanding of instructions and care plan provided today and agrees to view in [place]. Active MyChart status confirmed with patient.    Please call the care guide team at [phone] if you need to cancel or reschedule your appointment.   Kennon Holter, PharmD, [place], CPP Clinical Pharmacist Practitioner [place] [phone]

## 2021-03-20 NOTE — Chronic Care Management (AMB) (Signed)
Chronic Care Management Pharmacy Note  03/20/2021 Name:  [name] MRN:  [id number] DOB:  09/14/1955  Summary: Type 2 Diabetes - Goal on Track (progressing): YES.: Uncontrolled but improving; Most recent A1c 10.8>8.8 but still above goal of <7% per ADA guidelines. CGM GMI 6.9% after 2 week sample given.  No hypoglycemia on Freestyle Libre 3. Current glucose readings:  see Freestyle LibreView below. Patient completed 14 day sample of Freestyle Libre 3 on 03/10/21 and resumed finger stick blood glucose and reports blood glucose consistent with sensor glucose during sample. Time in target range 80% and time above range 20%. Average glucose of 148 with GMI of 6.9%. Glucose variability 27%.  Order for Dexcom G6 still pending. Patient reports she has $50 copay for supplies and anticipates arrival within next few weeks. Patient requests appointment to help her complete training, setup, and placement of continuous glucose monitor. Patient has compatible smart phone and will not need a reader device.  Instructed to monitor blood sugars with finger stick blood glucose four times daily until new continuous glucose monitor placed.  Discussed management of hypoglycemia. If blood sugar <70 at any time, treat with simple sugar such as 1/2 cup juice or regular soda or 3-4 glucose tablets. Recheck blood sugar in 15 minutes and repeat if blood sugar remains <70.  Continue metformin 500 mg by mouth twice daily with meals Continue insulin degludec Tyler Aas) 50 units subcutaneously at bedtime since fasting blood glucose within goal range. Increase dulaglutide (Trulicity) to 3 mg subcutaneously once weekly Decrease insulin aspart (Novolog) to 8 units subcutaneously 5-15 minutes before each meal (decreased by 20% due to increase in Trulicity)       Hyperlipidemia Consider increasing dose of pravastatin if LDL remains elevated at next check despite dietary change.  Subjective: [name] is an 66 y.o.  year old female who is a primary patient of Luking, Elayne Snare, MD.  The CCM team was consulted for assistance with disease management and care coordination needs.    Engaged with patient by telephone for follow up visit in response to provider referral for pharmacy case management and/or care coordination services.   Consent to Services:  The patient was given information about Chronic Care Management services, agreed to services, and gave verbal consent prior to initiation of services.  Please see initial visit note for detailed documentation.   Patient Care Team: Kathyrn Drown, MD as PCP - General (Family Medicine) Satira Sark, MD as PCP - Cardiology (Cardiology) Danie Binder, MD (Inactive) as Consulting Physician (Gastroenterology) Beryle Lathe, [facility] (Pharmacist)  Objective:  Lab Results  Component Value Date   CREATININE 1.02 (H) 09/23/2020   CREATININE 1.04 (H) 05/04/2020   CREATININE 1.07 (H) 11/26/2019    Lab Results  Component Value Date   HGBA1C 8.8 (H) 02/24/2021   Last diabetic Eye exam:  Lab Results  Component Value Date/Time   HMDIABEYEEXA Retinopathy (A) 04/26/2020 12:00 AM    Last diabetic Foot exam: No results found for: HMDIABFOOTEX      Component Value Date/Time   CHOL 159 09/23/2020 1036   TRIG 173 (H) 09/23/2020 1036   HDL 49 09/23/2020 1036   CHOLHDL 3.2 09/23/2020 1036   CHOLHDL 2.5 07/02/2014 0714   VLDL 35 07/02/2014 0714   LDLCALC 81 09/23/2020 1036    Hepatic Function Latest Ref Rng & Units 09/23/2020 11/26/2019 03/26/2019  Total Protein 6.0 - 8.5 g/dL 6.6 6.7 6.6  Albumin 3.8 - 4.8 g/dL  4.3 4.3 4.1  AST 0 - 40 IU/L _0 ALT 0 - 32 IU/L _1 Alk Phosphatase 44 - 121 IU/L 102 101 92  Total Bilirubin 0.0 - 1.2 mg/dL 0.5 0.6 0.4  Bilirubin, Direct 0.00 - 0.40 mg/dL 0.16 - 0.15    Lab Results  Component Value Date/Time   TSH 1.958 09/14/2013 12:01 AM   TSH 1.711 09/19/2012 10:20 AM   FREET4 1.37 09/14/2013 12:01  AM    CBC Latest Ref Rng & Units 11/26/2019 01/07/2018 07/03/2017  WBC 3.4 - 10.8 x10E3/uL 9.4 8.1 10.4  Hemoglobin 11.1 - 15.9 g/dL 13.4 12.7 13.6  Hematocrit 34.0 - 46.6 % 40.0 38.0 41.3  Platelets 150 - 450 x10E3/uL 358 313 319    No results found for: VD25OH  Clinical ASCVD: No  The 10-year ASCVD risk score (Arnett DK, et al., 2019) is: 15.2%   Values used to calculate the score:     Age: 16 years     Sex: Female     Is Non-Hispanic African American: No     Diabetic: Yes     Tobacco smoker: No     Systolic Blood Pressure: 952 mmHg     Is BP treated: Yes     HDL Cholesterol: 49 mg/dL     Total Cholesterol: 159 mg/dL    Social History   Tobacco Use  Smoking Status Never  Smokeless Tobacco Never   BP Readings from Last 3 Encounters:  09/09/20 124/72  03/28/20 124/70  03/24/20 130/82   Pulse Readings from Last 3 Encounters:  03/28/20 (!) 118  03/24/20 78  03/15/20 77   Wt Readings from Last 3 Encounters:  09/09/20 249 lb 6.4 oz (113.1 kg)  03/28/20 247 lb (112 kg)  03/24/20 249 lb 6.4 oz (113.1 kg)    Assessment: Review of patient past medical history, allergies, medications, health status, including review of consultants reports, laboratory and other test data, was performed as part of comprehensive evaluation and provision of chronic care management services.   SDOH:  (Social Determinants of Health) assessments and interventions performed:    CCM Care Plan  No Known Allergies  Medications Reviewed Today     Reviewed by Beryle Lathe, [facility] (Pharmacist) on 03/20/21 at [place] List Status: <None>   Medication Order Taking? Sig Documenting Provider Last Dose Status Informant  acetaminophen (TYLENOL) 500 MG tablet [id number] Yes Take 1,000 mg by mouth every 6 (six) hours as needed for moderate pain or headache. [provider] Taking Active Self  ALPRAZolam Duanne Moron) 0.5 MG tablet [id number] Yes TAKE 1/2 TO 1 TABLET BY MOUTH TWICE DAILY AS NEEDED.  Kathyrn Drown, MD Taking Active   amLODipine (NORVASC) 10 MG tablet [id number] Yes TAKE ONE TABLET BY MOUTH DAILY Kathyrn Drown, MD Taking Active   aspirin 81 MG tablet [id number] Yes Take 81 mg by mouth daily. [provider] Taking Active Self  Blood Glucose Monitoring Suppl (ACCU-CHEK GUIDE) w/Device KIT [id number] Yes Use to check blood glucose three time daily Luking, Elayne Snare, MD Taking Active   Dulaglutide (TRULICITY) 3 YS/0.6TK SOPN [id number] Yes Inject 3 mg into the skin once a week. Kathyrn Drown, MD  Active   glucose blood test strip [id number] Yes Use as instructed to test blood glucose three time daily Luking, Elayne Snare, MD Taking Active            Med Note Waldo Laine, Gwenyth Allegra   Fri Feb 24, 2021 10:31 AM) Accu-Chek  insulin aspart (NOVOLOG FLEXPEN) 100 UNIT/ML FlexPen [id number]  Inject 8 Units into the skin 3 (three) times daily with meals. Kathyrn Drown, MD  Active   insulin degludec (TRESIBA) 100 UNIT/ML FlexTouch Pen [id number] Yes Inject 50 Units into the skin at bedtime. Kathyrn Drown, MD Taking Active   Insulin Pen Needle (SURE COMFORT PEN NEEDLES) 31G X 5 MM MISC [id number]  Use to inject insulin four times daily Luking, Elayne Snare, MD  Active   Lancets (ONETOUCH DELICA PLUS JHERDE08X) [place] [id number]  USE AS DIRECTED. Kathyrn Drown, MD  Active   lisinopril (ZESTRIL) 40 MG tablet [id number] Yes TAKE ONE TABLET BY MOUTH ONCE DAILY. Kathyrn Drown, MD Taking Active   metFORMIN (GLUCOPHAGE-XR) 500 MG 24 hr tablet [id number] Yes Take 1 tablet (500 mg total) by mouth 2 (two) times daily with a meal. Luking, Elayne Snare, MD Taking Active   metoprolol tartrate (LOPRESSOR) 50 MG tablet [id number] Yes 1 bid Kathyrn Drown, MD Taking Active   pravastatin (PRAVACHOL) 20 MG tablet [id number] Yes TAKE 1 TABLET BY MOUTH AT BEDTIME FOR CHOLESTEROL. Kathyrn Drown, MD Taking Active   warfarin (COUMADIN) 5 MG tablet [id number] Yes TAKE 1/2 TABLET BY MOUTH DAILY EXCEPT 1 TABLET ON MONDAYS,  [place] OR AS DIRECTED Satira Sark, MD Taking Active             Patient Active Problem List   Diagnosis Date Noted   Visit for suture removal 03/28/2020   Laceration of right little finger 03/24/2020   Hyperlipidemia associated with type 2 diabetes mellitus ([place]) 12/08/2019   Encounter for screening colonoscopy 12/09/2017   Preoperative clearance 09/30/2017   ( 06/04/2016   Encounter for therapeutic drug monitoring 01/12/2016   S/P AVR 01/03/2016   Insomnia 06/15/2015   Thyroid nodule 09/15/2012   Renal insufficiency 09/15/2012   Aortic stenosis    Type 2 diabetes mellitus not at goal [facility])    Hypertension    Hyperlipidemia    Restless leg syndrome    Ruptured disk     Immunization History  Administered Date(s) Administered   Fluad Quad(high Dose 65+) 12/08/2019, 12/28/2020   Influenza Split 12/18/2012   Influenza,inj,Quad PF,6+ Mos 11/30/2013, 12/30/2014, 01/10/2016, 11/26/2016, 01/07/2018, 12/26/2018   Influenza-Unspecified 02/03/2012, 01/07/2018   Moderna Sars-Covid-2 Vaccination 10/08/2019, 11/09/2019   Pneumococcal Conjugate-13 08/27/2019   Pneumococcal Polysaccharide-23 09/03/2007, 01/10/2016   Tdap 03/15/2020   Zoster Recombinat (Shingrix) 11/23/2019, 05/04/2020    Conditions to be addressed/monitored: HTN, HLD, and DMII  Care Plan : Medication Management  Updates made by Beryle Lathe, [place] since 03/20/2021 12:00 AM     Problem: T2DM, HTN, HLD   Priority: High  Onset Date: 12/26/2020     Long-Range Goal: Disease Progression Prevention   Start Date: 12/26/2020  Expected End Date: 03/26/2021  Recent Progress: On track  Priority: High  Note:   Current Barriers:  Unable to independently monitor therapeutic efficacy Unable to achieve control of diabetes and hyperlipidemia  Pharmacist Clinical Goal(s):  Through collaboration with PharmD and provider, patient will:  achieve adherence to monitoring guidelines and  medication adherence to achieve therapeutic efficacy achieve control of diabetes and hyperlipidemia as evidenced by improved fasting blood sugar, improved A1c, improved LDL, and improved triglycerides adhere to plan to optimize therapeutic regimen for diabetes as evidenced by report of adherence to recommended medication management changes   Interventions: 1:1 collaboration with Kathyrn Drown, MD regarding development and update  of comprehensive plan of care as evidenced by provider attestation and co-signature Inter-disciplinary care team collaboration (see longitudinal plan of care) Comprehensive medication review performed; medication list updated in electronic medical record  Type 2 Diabetes - Goal on Track (progressing): YES.: Uncontrolled but improving; Most recent A1c 10.8>8.8 but still above goal of <7% per ADA guidelines. CGM GMI 6.9% after 2 week sample given.  Current medications: metformin 500 mg by mouth twice daily with meals, dulaglutide (Trulicity) 1.5 mg subcutaneously weekly, and insulin degludec Tyler Aas) 50 units subcutaneously at bedtime and insulin aspart (Novolog) 10 units subcutaneously 5-15 minutes before each meal Intolerances: none Taking medications as directed: yes Side effects thought to be attributed to current medication regimen: no Denies hypoglycemic symptoms (sweaty and shaky). No hypoglycemia on Freestyle Libre 3. Hypoglycemia prevention: not indicated at this time Current meal patterns: not discussed today Current exercise: not discussed today On a statin: yes On aspirin 81 mg daily: yes Last microalbumin/creatinine ratio: 21 (09/23/20); on an ACEi/ARB: yes Last eye exam: completed within last year Last foot exam: completed within last year Pneumonia vaccine: overdue, needs single dose of PPSV23 or PCV20. PCV13 received July 2021 Influenza vaccine: up to date Shingrix: series complete Current glucose readings:  see Freestyle LibreView below. Patient  completed 14 day sample of Freestyle Libre 3 on 03/10/21 and resumed finger stick blood glucose and reports blood glucose consistent with sensor glucose during sample. Time in target range 80% and time above range 20%. Average glucose of 148 with GMI of 6.9%. Glucose variability 27%.  Order for Dexcom G6 still pending. Patient reports she has $50 copay for supplies and anticipates arrival within next few weeks. Patient requests appointment to help her complete training, setup, and placement of continuous glucose monitor. Patient has compatible smart phone and will not need a reader device.  Instructed to monitor blood sugars with finger stick blood glucose four times daily until new continuous glucose monitor placed.  Discussed management of hypoglycemia. If blood sugar <70 at any time, treat with simple sugar such as 1/2 cup juice or regular soda or 3-4 glucose tablets. Recheck blood sugar in 15 minutes and repeat if blood sugar remains <70.  Continue metformin 500 mg by mouth twice daily with meals Continue insulin degludec Tyler Aas) 50 units subcutaneously at bedtime since fasting blood glucose within goal range. Increase dulaglutide (Trulicity) to 3 mg subcutaneously once weekly Decrease insulin aspart (Novolog) to 8 units subcutaneously 5-15 minutes before each meal (decreased by 20% due to increase in Trulicity)       Hypertension - Condition stable. Not addressed this visit.: Blood pressure under good control. Blood pressure is at goal of <130/80 mmHg per 2017 AHA/ACC guidelines. Current medications: lisinopril 40 mg by mouth once daily, amlodipine 10 mg by mouth once daily, and metoprolol tartrate 50 mg by mouth twice daily Intolerances: none Taking medications as directed: yes Side effects thought to be attributed to current medication regimen: no Current home blood pressure: patient has a blood pressure machine but does not currently check Continue lisinopril 40 mg by mouth once daily,  amlodipine 10 mg by mouth once daily, and metoprolol tartrate 50 mg by mouth twice daily Encourage dietary sodium restriction/DASH diet Recommend home blood pressure monitoring to discuss at next visit  Hyperlipidemia - Condition stable. Not addressed this visit.: Uncontrolled. LDL above goal of <70 due to very high risk given diabetes + at least 1 additional major risk factor (hypertension) per 2020 AACE/ACE guidelines. Triglycerides above goal of <  150 per 2020 AACE/ACE guidelines. Current medications: pravastatin 20 mg by mouth once daily Intolerances: none Taking medications as directed: yes Side effects thought to be attributed to current medication regimen: no Continue pravastatin 20 mg by mouth once daily Encourage dietary reduction of high fat containing foods such as butter, nuts, bacon, egg yolks, etc. Re-check lipid panel in 4-12 weeks Patient reports diet high in fast food intake which largely consists of fried foods. Patient wants to work on reducing this. Will consider increasing dose of pravastatin if LDL remains elevated at next check despite dietary change.  Patient Goals/Self-Care Activities Patient will:  Take medications as prescribed Check blood sugar four times daily  Check blood pressure at least once daily, document, and provide at future appointments Engage in dietary modifications by less frequent dining out, decreased fat intake, and fewer sweetened foods & beverages  Follow Up Plan: Face to Face appointment with care management team member scheduled for: 03/31/21      Medication Assistance: None required.  Patient affirms current coverage meets needs.  Patient's preferred pharmacy is:  [facility], [address] [place] [address] Phone: [phone] Fax: [phone]  Follow Up:  Patient agrees to Care Plan and Follow-up.  Plan: Face to Face appointment with care management team member scheduled for:  03/31/21  Kennon Holter, PharmD, BCACP, CPP Clinical Pharmacist Practitioner [place] [phone]

## 2021-03-21 ENCOUNTER — Encounter: Payer: Self-pay | Admitting: Family Medicine

## 2021-03-22 ENCOUNTER — Ambulatory Visit (INDEPENDENT_AMBULATORY_CARE_PROVIDER_SITE_OTHER): Payer: Medicare PPO | Admitting: *Deleted

## 2021-03-22 DIAGNOSIS — Z5181 Encounter for therapeutic drug level monitoring: Secondary | ICD-10-CM | POA: Diagnosis not present

## 2021-03-22 DIAGNOSIS — Z952 Presence of prosthetic heart valve: Secondary | ICD-10-CM | POA: Diagnosis not present

## 2021-03-22 LAB — POCT INR: INR: 1.7 — AB (ref 2.0–3.0)

## 2021-03-22 NOTE — Patient Instructions (Signed)
Increase warfarin to 1 tablet daily except 1/2 tablet on Mondays Recheck in 2 weeks  Continue greens

## 2021-03-22 NOTE — Telephone Encounter (Signed)
Nurses Please go forward with the process that [place] recommends.  Also please notify patient as well.  In 1 month if the 3 mg is available we will shift to that dosing.  Please encourage the patient to reach out to [place] in approximately 4 weeks regarding this measure.

## 2021-03-23 ENCOUNTER — Other Ambulatory Visit: Payer: Self-pay | Admitting: Family Medicine

## 2021-03-23 DIAGNOSIS — E119 Type 2 diabetes mellitus without complications: Secondary | ICD-10-CM

## 2021-03-26 ENCOUNTER — Other Ambulatory Visit: Payer: Self-pay | Admitting: Family Medicine

## 2021-03-26 DIAGNOSIS — E119 Type 2 diabetes mellitus without complications: Secondary | ICD-10-CM

## 2021-03-27 ENCOUNTER — Other Ambulatory Visit: Payer: Self-pay

## 2021-03-27 ENCOUNTER — Ambulatory Visit (INDEPENDENT_AMBULATORY_CARE_PROVIDER_SITE_OTHER): Payer: Medicare PPO | Admitting: Family Medicine

## 2021-03-27 VITALS — BP 120/70 | HR 83 | Temp 97.0°F | Wt 259.2 lb

## 2021-03-27 DIAGNOSIS — E785 Hyperlipidemia, unspecified: Secondary | ICD-10-CM

## 2021-03-27 DIAGNOSIS — E119 Type 2 diabetes mellitus without complications: Secondary | ICD-10-CM

## 2021-03-27 DIAGNOSIS — Z8679 Personal history of other diseases of the circulatory system: Secondary | ICD-10-CM | POA: Diagnosis not present

## 2021-03-27 DIAGNOSIS — E1169 Type 2 diabetes mellitus with other specified complication: Secondary | ICD-10-CM

## 2021-03-27 DIAGNOSIS — I10 Essential (primary) hypertension: Secondary | ICD-10-CM

## 2021-03-27 DIAGNOSIS — I719 Aortic aneurysm of unspecified site, without rupture: Secondary | ICD-10-CM | POA: Insufficient documentation

## 2021-03-27 MED ORDER — PRAVASTATIN SODIUM 20 MG PO TABS: ORAL_TABLET | ORAL | 1 refills | Status: DC

## 2021-03-27 MED ORDER — TRULICITY 3 MG/0.5ML ~~LOC~~ SOAJ: 3.0000 mg | SUBCUTANEOUS | 5 refills | Status: DC

## 2021-03-27 MED ORDER — METFORMIN HCL ER 500 MG PO TB24: 500.0000 mg | ORAL_TABLET | Freq: Two times a day (BID) | ORAL | 5 refills | Status: DC

## 2021-03-27 MED ORDER — LISINOPRIL 40 MG PO TABS: 40.0000 mg | ORAL_TABLET | Freq: Every day | ORAL | 1 refills | Status: DC

## 2021-03-27 MED ORDER — METOPROLOL TARTRATE 50 MG PO TABS: ORAL_TABLET | ORAL | 1 refills | Status: DC

## 2021-03-27 MED ORDER — AMLODIPINE BESYLATE 10 MG PO TABS: 10.0000 mg | ORAL_TABLET | Freq: Every day | ORAL | 1 refills | Status: DC

## 2021-03-27 NOTE — Progress Notes (Signed)
° °  Subjective:    Patient ID: [name], female    DOB: Dec 22, 1955, 66 y.o.   MRN: [id number]  HPI Patient here for diabetes check up. Blood sugars have been staying good. Very complex patient Has underlying diabetes and heart disease Followed by [place] primarily sees cardiologist once a year Previous heart surgery Patient has had previous scan for aortic aneurysm   Review of Systems     Objective:   Physical Exam General-in no acute distress Eyes-no discharge Lungs-respiratory rate normal, CTA CV-no murmurs,RRR Extremities skin warm dry no edema Neuro grossly normal Behavior normal, alert        Assessment & Plan:  1. Primary hypertension Blood pressure decent control healthy diet recommended portion control regular activity within the house she does not like to get outside much - Hepatic function panel - Basic Metabolic Panel (BMET)  2. Hyperlipidemia associated with type 2 diabetes mellitus ([place]) Keep cholesterol under good control continue medication watch diet closely - Lipid Profile - Basic Metabolic Panel (BMET)  3. Type 2 diabetes mellitus not at goal [facility]) Work hard with dietary control Also will get monitor To help her continuously monitor her sugar that will help her Continue the current measures she is using the 10 units of insulin with mealtime she will backed this down to 8 when she is able to restart her Trulicity and utilize continuous monitor - Dulaglutide (TRULICITY) 3 XV/4.0GQ SOPN; Inject 3 mg into the skin once a week.  Dispense: 6 mL; Refill: 5 - metFORMIN (GLUCOPHAGE-XR) 500 MG 24 hr tablet; Take 1 tablet (500 mg total) by mouth 2 (two) times daily with a meal.  Dispense: 60 tablet; Refill: 5 - Hepatic function panel - Basic Metabolic Panel (BMET)  4. History of aortic stenosis Follow through with cardiology for postsurgical follow-up and annual cardiology visit More than likely they will do an echo to look at her aorta to make sure that  she does not have any dilation or aneurysm - Ambulatory referral to Cardiology  5. Morbid obesity ([place]) Portion control regular physical activity Lab work later this year Follow-up in 4 months

## 2021-03-28 LAB — BASIC METABOLIC PANEL
BUN/Creatinine Ratio: 23 (ref 12–28)
BUN: 28 mg/dL — ABNORMAL HIGH (ref 8–27)
CO2: 23 mmol/L (ref 20–29)
Calcium: 9.6 mg/dL (ref 8.7–10.3)
Chloride: 101 mmol/L (ref 96–106)
Creatinine, Ser: 1.24 mg/dL — ABNORMAL HIGH (ref 0.57–1.00)
Glucose: 133 mg/dL — ABNORMAL HIGH (ref 70–99)
Potassium: 4.9 mmol/L (ref 3.5–5.2)
Sodium: 139 mmol/L (ref 134–144)
eGFR: 48 mL/min/{1.73_m2} — ABNORMAL LOW (ref >59)

## 2021-03-28 LAB — HEPATIC FUNCTION PANEL
ALT: 13 IU/L (ref 0–32)
AST: 14 IU/L (ref 0–40)
Albumin: 3.8 g/dL (ref 3.8–4.8)
Alkaline Phosphatase: 85 IU/L (ref 44–121)
Bilirubin Total: 0.4 mg/dL (ref 0.0–1.2)
Bilirubin, Direct: 0.1 mg/dL (ref 0.00–0.40)
Total Protein: 6.9 g/dL (ref 6.0–8.5)

## 2021-03-28 LAB — LIPID PANEL
Chol/HDL Ratio: 2.6 ratio (ref 0.0–4.4)
Cholesterol, Total: 142 mg/dL (ref 100–199)
HDL: 55 mg/dL (ref >39)
LDL Chol Calc (NIH): 67 mg/dL (ref 0–99)
Triglycerides: 109 mg/dL (ref 0–149)
VLDL Cholesterol Cal: 20 mg/dL (ref 5–40)

## 2021-03-29 ENCOUNTER — Ambulatory Visit: Payer: Medicare PPO | Admitting: Family Medicine

## 2021-03-29 DIAGNOSIS — E119 Type 2 diabetes mellitus without complications: Secondary | ICD-10-CM | POA: Diagnosis not present

## 2021-03-30 ENCOUNTER — Other Ambulatory Visit: Payer: Self-pay

## 2021-03-30 DIAGNOSIS — N289 Disorder of kidney and ureter, unspecified: Secondary | ICD-10-CM

## 2021-03-31 ENCOUNTER — Ambulatory Visit: Payer: Medicare PPO

## 2021-04-03 ENCOUNTER — Ambulatory Visit: Payer: Medicare PPO

## 2021-04-04 DIAGNOSIS — I10 Essential (primary) hypertension: Secondary | ICD-10-CM

## 2021-04-04 DIAGNOSIS — E785 Hyperlipidemia, unspecified: Secondary | ICD-10-CM

## 2021-04-04 DIAGNOSIS — E119 Type 2 diabetes mellitus without complications: Secondary | ICD-10-CM

## 2021-04-05 ENCOUNTER — Ambulatory Visit (INDEPENDENT_AMBULATORY_CARE_PROVIDER_SITE_OTHER): Payer: Medicare PPO | Admitting: *Deleted

## 2021-04-05 DIAGNOSIS — Z5181 Encounter for therapeutic drug level monitoring: Secondary | ICD-10-CM | POA: Diagnosis not present

## 2021-04-05 DIAGNOSIS — Z952 Presence of prosthetic heart valve: Secondary | ICD-10-CM | POA: Diagnosis not present

## 2021-04-05 LAB — POCT INR: INR: 2.4 (ref 2.0–3.0)

## 2021-04-05 NOTE — Patient Instructions (Signed)
Continue warfarin 1 tablet daily except 1/2 tablet on Mondays Recheck in 4 weeks  Continue greens

## 2021-04-10 ENCOUNTER — Ambulatory Visit (INDEPENDENT_AMBULATORY_CARE_PROVIDER_SITE_OTHER): Payer: Medicare PPO | Admitting: Pharmacist

## 2021-04-10 ENCOUNTER — Other Ambulatory Visit: Payer: Self-pay

## 2021-04-10 DIAGNOSIS — E1169 Type 2 diabetes mellitus with other specified complication: Secondary | ICD-10-CM

## 2021-04-10 DIAGNOSIS — I10 Essential (primary) hypertension: Secondary | ICD-10-CM

## 2021-04-10 DIAGNOSIS — E119 Type 2 diabetes mellitus without complications: Secondary | ICD-10-CM

## 2021-04-10 NOTE — Patient Instructions (Signed)
[name],  It was great to talk to you today!  Please call me with any questions or concerns.  Visit Information  Following are the goals we discussed today:   Goals Addressed             This Visit's Progress    Medication Management       Patient Goals/Self-Care Activities Patient will:  Take medications as prescribed Check blood sugar continuously with Dexcom G6 Check blood pressure at least once daily, document, and provide at future appointments Engage in dietary modifications by less frequent dining out, decreased fat intake, and fewer sweetened foods & beverages           Follow-up plan: Telephone follow up appointment with care management team member scheduled for:  05/15/21  Patient verbalizes understanding of instructions and care plan provided today and agrees to view in [place]. Active MyChart status confirmed with patient.    Please call the care guide team at [phone] if you need to cancel or reschedule your appointment.   Kennon Holter, PharmD, BCACP, CPP Clinical Pharmacist Practitioner [place] [phone]  Dexcom G6 Training  1. For help or to see the videos: [email or web address].com/training-videos  2. You should have a sensor, a transmitter, and a display device. - Each sensor is good for 10 days - Each transmitter is good for 3 months    3. You will still need your glucose meter - If you take acetaminophen 1000 mg every 6 hours or more - Any time your symptoms do not match the reading on your Dexcom - Any time you don't have a number and an arrow  If you have a MRI or CT scan - The company recommends you not wear the dexcom as it is unknown if the heat and magnetic fields could damage the components. - To get the most out of your session, we advise that you try to schedule your procedure near the end of your sensor session to avoid needing an extra sensor.  4. Change the site where you place the sensor  with each new insertion.  If there is adhesive residue left on the skin, you can try applying a household oil like baby oil or coconut oil.  5. Avoid injecting insulin within 3 inches of the sensor.  6. Water. The transmitter is water resistant, but the receiver is not.  You can still swim, shower, and take a bath.  However, your receiver needs to be in a dry area and within 20 feet of the transmitter to get readings.    7. If you want to use your smartphone: - Download the Dexcom G6 app.  You will need to create a Dexcom account.   - The app and the receiver do not talk to each other.  Therefore, you'll have to set up alerts in the app as well.   8. If you want to share your data with your provider. - Once you have downloaded the Dexcom G6 app, download the Dexcom Clarity app. - If you use the Dexcom G6 app, you will not need to upload data to a computer since the data is automatically sent to [facility].   - The clinic will then invite you to share your data with the clinic's account in [place].    Here is how the clinic will create an invitation to send to you:  Click Patients.  Click the patient's name you want to invite.  Click Share data.  Click Print  an Invitation to view and print, or Email an Invitation, then enter the patient's email address.  Click Invite, then Close.  9. You can call Dexcom for help: [phone] - For an error message or issues with the sensors: select option 2. - For help with data sharing: select option 3 for Patient Care. They are available Monday through Friday 9 am-8pm andSaturday 10 am-4:30 pm.   10.  The sensor glucose may not always be the same as your blood glucose. Sensor glucose is a glucose estimate that lags behind the blood glucose.

## 2021-04-10 NOTE — Chronic Care Management (AMB) (Addendum)
Chronic Care Management Pharmacy Note  05/15/2021 Name:  [name] MRN:  [id number] DOB:  1955/04/26  Summary: Type 2 Diabetes Uncontrolled but improving; Most recent A1c 10.8>8.8 but still above goal of <7% per ADA guidelines Recently attempted to increase Trulicity to 3 mg weekly and decrease Novolog to 8 units subcutaneously three time daily before each meal but patient contacted our office and was unable to execute this plan due to Trulicity 3 mg shortage. Therefore, after discussion with primary care provider, we decided to send in prescription for 1 month supply of Trulicity 1.5 mg and continue Novolog 10 units as before. Since then, patient was able to finally get Trulicity 3 mg filled at her pharmacy. She has 2 doses remaining of the 1.5 mg dose and then will plan to increase Trulicity to 3 mg and decrease Novolog to 8 unit three time daily before meals.  Continue metformin 500 mg by mouth twice daily with meals Continue insulin degludec Tyler Aas) 50 units subcutaneously at bedtime since fasting blood glucose within goal range. Patient received Dexcom G6 (3 sensors and 1 transmitter) from DME supplier and brought to clinic today to complete training and placement. I assisted the patient in setting up sensor and transmitter. First sensor was placed subcutaneously and started today in clinic. Patient reports she has $50 copay for supplies and is aware to contact [facility] monthly for refills. Patient has compatible smart phone and will not need a reader device. Patient has shared her data with me via [place]. Will follow-up review of blood glucose in 6 weeks. Instructed to monitor blood sugars continuously with Dexcom G6. Patient aware to check finger stick blood glucose if sensor glucose reading dose not match symptoms.  Discussed management of hypoglycemia. If blood sugar <70 at any time, treat with simple sugar such as 1/2 cup juice or regular soda or 3-4 glucose  tablets. Recheck blood sugar in 15 minutes and repeat if blood sugar remains <70.   Subjective: [name] is an 66 y.o. year old female who is a primary patient of Luking, Elayne Snare, MD.  The CCM team was consulted for assistance with disease management and care coordination needs.    Engaged with patient face to face for follow up visit in response to provider referral for pharmacy case management and/or care coordination services.   Consent to Services:  The patient was given information about Chronic Care Management services, agreed to services, and gave verbal consent prior to initiation of services.  Please see initial visit note for detailed documentation.   Patient Care Team: Kathyrn Drown, MD as PCP - General (Family Medicine) Satira Sark, MD as PCP - Cardiology (Cardiology) Danie Binder, MD (Inactive) as Consulting Physician (Gastroenterology) Beryle Lathe, [facility] (Pharmacist)  Objective:  Lab Results  Component Value Date   CREATININE 1.24 (H) 03/27/2021   CREATININE 1.02 (H) 09/23/2020   CREATININE 1.04 (H) 05/04/2020    Lab Results  Component Value Date   HGBA1C 8.8 (H) 02/24/2021   Last diabetic Eye exam:  Lab Results  Component Value Date/Time   HMDIABEYEEXA Retinopathy (A) 04/26/2020 12:00 AM    Last diabetic Foot exam: No results found for: HMDIABFOOTEX      Component Value Date/Time   CHOL 142 03/27/2021 1202   TRIG 109 03/27/2021 1202   HDL 55 03/27/2021 1202   CHOLHDL 2.6 03/27/2021 1202   CHOLHDL 2.5 07/02/2014 0714   VLDL 35 07/02/2014 0714  LDLCALC 67 03/27/2021 1202    Hepatic Function Latest Ref Rng & Units 03/27/2021 09/23/2020 11/26/2019  Total Protein 6.0 - 8.5 g/dL 6.9 6.6 6.7  Albumin 3.8 - 4.8 g/dL 3.8 4.3 4.3  AST 0 - 40 IU/L '14 17 19  ' ALT 0 - 32 IU/L '13 14 17  ' Alk Phosphatase 44 - 121 IU/L 85 102 101  Total Bilirubin 0.0 - 1.2 mg/dL 0.4 0.5 0.6  Bilirubin, Direct 0.00 - 0.40 mg/dL 0.10 0.16 -    Lab Results   Component Value Date/Time   TSH 1.958 09/14/2013 12:01 AM   TSH 1.711 09/19/2012 10:20 AM   FREET4 1.37 09/14/2013 12:01 AM    CBC Latest Ref Rng & Units 11/26/2019 01/07/2018 07/03/2017  WBC 3.4 - 10.8 x10E3/uL 9.4 8.1 10.4  Hemoglobin 11.1 - 15.9 g/dL 13.4 12.7 13.6  Hematocrit 34.0 - 46.6 % 40.0 38.0 41.3  Platelets 150 - 450 x10E3/uL 358 313 319    No results found for: VD25OH  Clinical ASCVD: No  The 10-year ASCVD risk score (Arnett DK, et al., 2019) is: 13.1%   Values used to calculate the score:     Age: 29 years     Sex: Female     Is Non-Hispanic African American: No     Diabetic: Yes     Tobacco smoker: No     Systolic Blood Pressure: 774 mmHg     Is BP treated: Yes     HDL Cholesterol: 55 mg/dL     Total Cholesterol: 142 mg/dL    Social History   Tobacco Use  Smoking Status Never  Smokeless Tobacco Never   BP Readings from Last 3 Encounters:  03/27/21 120/70  09/09/20 124/72  03/28/20 124/70   Pulse Readings from Last 3 Encounters:  03/27/21 83  03/28/20 (!) 118  03/24/20 78   Wt Readings from Last 3 Encounters:  03/27/21 259 lb 3.2 oz (117.6 kg)  09/09/20 249 lb 6.4 oz (113.1 kg)  03/28/20 247 lb (112 kg)    Assessment: Review of patient past medical history, allergies, medications, health status, including review of consultants reports, laboratory and other test data, was performed as part of comprehensive evaluation and provision of chronic care management services.   SDOH:  (Social Determinants of Health) assessments and interventions performed:    CCM Care Plan  No Known Allergies  Medications Reviewed Today     Reviewed by Beryle Lathe, [facility] (Pharmacist) on 04/10/21 at 1658  Med List Status: <None>   Medication Order Taking? Sig Documenting Provider Last Dose Status Informant  acetaminophen (TYLENOL) 500 MG tablet [id number] Yes Take 1,000 mg by mouth every 6 (six) hours as needed for moderate pain or headache. [provider] Taking Active Self  ALPRAZolam Duanne Moron) 0.5 MG tablet [id number] Yes TAKE 1/2 TO 1 TABLET BY MOUTH TWICE DAILY AS NEEDED. Kathyrn Drown, MD Taking Active   amLODipine (NORVASC) 10 MG tablet [id number] Yes Take 1 tablet (10 mg total) by mouth daily. Kathyrn Drown, MD Taking Active   aspirin 81 MG tablet [id number] Yes Take 81 mg by mouth daily. [provider] Taking Active Self  Blood Glucose Monitoring Suppl (ACCU-CHEK GUIDE) w/Device KIT [id number] Yes Use to check blood glucose three time daily Luking, Elayne Snare, MD Taking Active   Continuous Blood Gluc Sensor ([place]) MISC [id number] Yes Every to monitor blood glucose. Change every 10 days. [provider]  Active Self  Continuous Blood Gluc Transmit (DEXCOM  G6 TRANSMITTER) MISC [id number]  Every to monitor blood glucose. Change every 90 days. [provider]  Active Self  Dulaglutide (TRULICITY) 3 TU/8.8KC SOPN [id number] No Inject 3 mg into the skin once a week.  Patient not taking: Reported on 04/10/2021   Kathyrn Drown, MD Not Taking Active            Med Note Rhea Belton Apr 10, 2021  3:30 PM) Received from pharmacy but not using until she runs out of the 1.11m dose  glucose blood test strip [id number] Use as instructed to test blood glucose three time daily LKathyrn Drown MD Taking Active            Med Note (Waldo Laine CGwenyth Allegra  Fri Feb 24, 2021 10:31 AM) Accu-Chek  insulin aspart (NOVOLOG FLEXPEN) 100 UNIT/ML FlexPen [id number] Inject 8 Units into the skin 3 (three) times daily with meals.  Patient taking differently: Inject 10 Units into the skin 3 (three) times daily with meals.   LKathyrn Drown MD Taking Active Self  Insulin Pen Needle (SURE COMFORT PEN NEEDLES) 31G X 5 MM MISC [id number] USE AS DIRECTED 4 TIMES DAILY. LKathyrn Drown MD Taking Active   Lancets (ONETOUCH DELICA PLUS LGBEEFE07H [id number] USE AS DIRECTED. LKathyrn Drown MD  Taking Active   lisinopril (ZESTRIL) 40 MG tablet [id number] Take 1 tablet (40 mg total) by mouth daily. LKathyrn Drown MD Taking Active   metFORMIN (GLUCOPHAGE-XR) 500 MG 24 hr tablet [id number] Take 1 tablet (500 mg total) by mouth 2 (two) times daily with a meal. Luking, SElayne Snare MD Taking Active   metoprolol tartrate (LOPRESSOR) 50 MG tablet [id number] 1 bid LKathyrn Drown MD Taking Active   pravastatin (PRAVACHOL) 20 MG tablet [id number] TAKE 1 TABLET BY MOUTH AT BEDTIME FOR CHOLESTEROL. LKathyrn Drown MD Taking Active   TRESIBA FLEXTOUCH 100 UNIT/ML FlexTouch Pen [id number] INJECT 50 UNITS INTO THE SKIN AT BEDTIME. LKathyrn Drown MD Taking Active   TRULICITY 1.5 MRR/1.1AFSBonney [id number] Inject 1.5 mg into the skin once a week. [provider] Taking Active   warfarin (COUMADIN) 5 MG tablet [id number] TAKE 1/2 TABLET BY MOUTH DAILY EXCEPT 1 TABLET ON MONDAYS, WAltoOR AS DIRECTED MSatira Sark MD Taking Active             Patient Active Problem List   Diagnosis Date Noted   A[place] aneurysm without rupture, unspecified portion of [place] ([place] 03/27/2021   Visit for suture removal 03/28/2020   Laceration of right little finger 03/24/2020   Hyperlipidemia associated with type 2 diabetes mellitus ([place] 12/08/2019   Encounter for screening colonoscopy 12/09/2017   Preoperative clearance 09/30/2017   ( 06/04/2016   Encounter for therapeutic drug monitoring 01/12/2016   S/P AVR 01/03/2016   Insomnia 06/15/2015   Thyroid nodule 09/15/2012   Renal insufficiency 09/15/2012   Aortic stenosis    Type 2 diabetes mellitus not at goal ([facility]    Hypertension    Hyperlipidemia    Restless leg syndrome    Ruptured disk     Immunization History  Administered Date(s) Administered   Fluad Quad(high Dose 65+) 12/08/2019, 12/28/2020   Influenza Split 12/18/2012   Influenza,inj,Quad PF,6+ Mos 11/30/2013, 12/30/2014, 01/10/2016,  11/26/2016, 01/07/2018, 12/26/2018   Influenza-Unspecified 02/03/2012, 01/07/2018   Moderna Sars-Covid-2 Vaccination 10/08/2019, 11/09/2019   Pneumococcal Conjugate-13 08/27/2019  Pneumococcal Polysaccharide-23 09/03/2007, 01/10/2016   Tdap 03/15/2020   Zoster Recombinat (Shingrix) 11/23/2019, 05/04/2020    Conditions to be addressed/monitored: HTN, HLD, and DMII  Care Plan : Medication Management  Updates made by Beryle Lathe, [place] since 05/15/2021 12:00 AM     Problem: T2DM, HTN, HLD   Priority: High  Onset Date: 12/26/2020     Long-Range Goal: Disease Progression Prevention   Start Date: 12/26/2020  Expected End Date: 03/26/2021  Recent Progress: On track  Priority: High  Note:   Current Barriers:  Unable to independently monitor therapeutic efficacy Unable to achieve control of diabetes  Pharmacist Clinical Goal(s):  Through collaboration with PharmD and provider, patient will:  Achieve adherence to monitoring guidelines and medication adherence to achieve therapeutic efficacy Achieve control of diabetes as evidenced by improved fasting blood sugar and improved A1c   Interventions: 1:1 collaboration with Kathyrn Drown, MD regarding development and update of comprehensive plan of care as evidenced by provider attestation and co-signature Inter-disciplinary care team collaboration (see longitudinal plan of care) Comprehensive medication review performed; medication list updated in electronic medical record  Type 2 Diabetes - Goal on Track (progressing): YES.: Uncontrolled but improving; Most recent A1c 10.8>8.8 but still above goal of <7% per ADA guidelines Current medications: metformin 500 mg by mouth twice daily with meals, dulaglutide (Trulicity) 1.5 mg subcutaneously weekly, and insulin degludec Tyler Aas) 50 units subcutaneously at bedtime and insulin aspart (Novolog) 10 units subcutaneously 5-15 minutes before each meal Recently attempted to increase  Trulicity to 3 mg weekly and decrease Novolog to 8 units subcutaneously three time daily before each meal but patient contacted our office and was unable to execute this plan due to Trulicity 3 mg shortage. Therefore, after discussion with primary care provider, we decided to send in prescription for 1 month supply of Trulicity 1.5 mg and continue Novolog 10 units as before. Since then, patient was able to finally get Trulicity 3 mg filled at her pharmacy. She has 2 doses remaining of the 1.5 mg dose and then will plan to increase Trulicity to 3 mg and decrease Novolog to 8 unit three time daily before meals.  Intolerances: none Taking medications as directed: yes Side effects thought to be attributed to current medication regimen: no Denies hypoglycemic symptoms (sweaty and shaky). Hypoglycemia prevention:  may consider Current meal patterns: not discussed today Current exercise: not discussed today On a statin: yes On aspirin 81 mg daily: yes Last microalbumin/creatinine ratio: 21 (09/23/20); on an ACEi/ARB: yes Last eye exam: completed within last year Last foot exam: completed within last year Pneumonia vaccine: overdue, needs single dose of PPSV23 or PCV20. PCV13 received July 2021 Influenza vaccine: up to date Shingrix: series complete Current glucose readings:  not discussed today  Patient received Dexcom G6 (3 sensors and 1 transmitter) from DME supplier and brought to clinic today to complete training and placement. I assisted the patient in setting up sensor and transmitter. First sensor was placed subcutaneously and started today in clinic. Patient reports she has $50 copay for supplies and is aware to contact [facility] monthly for refills. Patient has compatible smart phone and will not need a reader device. Patient has shared her data with me via [place]. Will follow-up review of blood glucose in 6 weeks. Instructed to monitor blood sugars continuously with Dexcom  G6. Patient aware to check finger stick blood glucose if sensor glucose reading dose not match symptoms.  Discussed management of hypoglycemia. If blood  sugar <70 at any time, treat with simple sugar such as 1/2 cup juice or regular soda or 3-4 glucose tablets. Recheck blood sugar in 15 minutes and repeat if blood sugar remains <70.  Continue metformin 500 mg by mouth twice daily with meals Continue insulin degludec Tyler Aas) 50 units subcutaneously at bedtime since fasting blood glucose within goal range. Increase dulaglutide (Trulicity) to 3 mg subcutaneously once weekly once supply of 1.5 mg dose depleted in ~2 weeks Decrease insulin aspart (Novolog) to 8 units (20% decrease) subcutaneously 5-15 minutes before each meal when Trulicity increased to 3 mg weekly   Hypertension - Condition stable. Not addressed this visit.: Blood pressure under good control. Blood pressure is at goal of <130/80 mmHg per 2017 AHA/ACC guidelines. Current medications: lisinopril 40 mg by mouth once daily, amlodipine 10 mg by mouth once daily, and metoprolol tartrate 50 mg by mouth twice daily Intolerances: none Taking medications as directed: yes Side effects thought to be attributed to current medication regimen: no Current home blood pressure: patient has a blood pressure machine but does not currently check Continue lisinopril 40 mg by mouth once daily, amlodipine 10 mg by mouth once daily, and metoprolol tartrate 50 mg by mouth twice daily Encourage dietary sodium restriction/DASH diet Recommend home blood pressure monitoring to discuss at next visit  Hyperlipidemia - Goal Met.: Controlled. LDL at goal of <70 due to very high risk given diabetes + at least 1 additional major risk factor (hypertension) per 2020 AACE/ACE guidelines. Triglycerides at goal of <150 per 2020 AACE/ACE guidelines. Current medications: pravastatin 20 mg by mouth once daily Intolerances: none Taking medications as directed: yes Side  effects thought to be attributed to current medication regimen: no Continue pravastatin 20 mg by mouth once daily Encourage dietary reduction of high fat containing foods such as butter, nuts, bacon, egg yolks, etc.  Patient Goals/Self-Care Activities Patient will:  Take medications as prescribed Check blood sugar continuously with Dexcom G6 Check blood pressure at least once daily, document, and provide at future appointments Engage in dietary modifications by less frequent dining out, decreased fat intake, and fewer sweetened foods & beverages  Follow Up Plan: Telephone follow up appointment with care management team member scheduled for: 05/15/21      Medication Assistance: None required.  Patient affirms current coverage meets needs.  Patient's preferred pharmacy is:  [facility], [place] [address] Phone: [phone] Fax: [phone]  Follow Up:  Patient agrees to Care Plan and Follow-up.  Plan: Telephone follow up appointment with care management team member scheduled for:  05/15/21  Kennon Holter, PharmD, BCACP, CPP Clinical Pharmacist Practitioner [place] [phone]

## 2021-05-01 DIAGNOSIS — E119 Type 2 diabetes mellitus without complications: Secondary | ICD-10-CM | POA: Diagnosis not present

## 2021-05-02 DIAGNOSIS — E785 Hyperlipidemia, unspecified: Secondary | ICD-10-CM | POA: Diagnosis not present

## 2021-05-02 DIAGNOSIS — I10 Essential (primary) hypertension: Secondary | ICD-10-CM

## 2021-05-02 DIAGNOSIS — E119 Type 2 diabetes mellitus without complications: Secondary | ICD-10-CM | POA: Diagnosis not present

## 2021-05-02 DIAGNOSIS — E1169 Type 2 diabetes mellitus with other specified complication: Secondary | ICD-10-CM

## 2021-05-03 ENCOUNTER — Ambulatory Visit (INDEPENDENT_AMBULATORY_CARE_PROVIDER_SITE_OTHER): Payer: Medicare PPO | Admitting: *Deleted

## 2021-05-03 DIAGNOSIS — Z5181 Encounter for therapeutic drug level monitoring: Secondary | ICD-10-CM | POA: Diagnosis not present

## 2021-05-03 DIAGNOSIS — Z952 Presence of prosthetic heart valve: Secondary | ICD-10-CM | POA: Diagnosis not present

## 2021-05-03 LAB — POCT INR: INR: 2.7 (ref 2.0–3.0)

## 2021-05-03 NOTE — Patient Instructions (Signed)
Continue warfarin 1 tablet daily except 1/2 tablet on Mondays ?Recheck in 4 weeks  ?Continue greens ?

## 2021-05-15 ENCOUNTER — Ambulatory Visit (INDEPENDENT_AMBULATORY_CARE_PROVIDER_SITE_OTHER): Payer: Medicare PPO | Admitting: Pharmacist

## 2021-05-15 DIAGNOSIS — E119 Type 2 diabetes mellitus without complications: Secondary | ICD-10-CM

## 2021-05-15 DIAGNOSIS — E785 Hyperlipidemia, unspecified: Secondary | ICD-10-CM

## 2021-05-15 DIAGNOSIS — E1169 Type 2 diabetes mellitus with other specified complication: Secondary | ICD-10-CM

## 2021-05-15 DIAGNOSIS — I10 Essential (primary) hypertension: Secondary | ICD-10-CM

## 2021-05-15 NOTE — Chronic Care Management (AMB) (Cosign Needed)
Chronic Care Management Pharmacy Note  05/15/2021 Name:  [name] MRN:  [id number] DOB:  12-18-1955  Summary: Type 2 Diabetes Uncontrolled but improving per continuous glucose monitor; GMI over last 30 days is 6.9% with a time in range of 80% which are both at goal. Most recent A1c 10.8>8.8% but still above goal of <7% per ADA guidelines. Recently changes: Trulicity increased to 3 mg weekly and Novolog decreased to 8 unit three time daily before meals.  Current glucose readings: see Dexcom Clarity report below for last 30 days. GMI 6.9% with time in rage 80%. Time above range 19%. Time below range <1%. Some hypoglycemia detected on Dexcom Clarity but patient reports she always checked with finger stick blood glucose and lowest true reading was in the 90s. She calibrated Dexcom and accuracy improved.  Patient is instructed to monitor blood sugars continuously with Dexcom G6. Patient aware to check finger stick blood glucose if sensor glucose reading dose not match symptoms. Patient has $50 copay for supplies and is aware to contact [facility] monthly for refills. Patient has compatible smart phone and will not need a reader device.  Discussed management of hypoglycemia. If blood sugar <70 at any time, treat with simple sugar such as 1/2 cup juice or regular soda or 3-4 glucose tablets. Recheck blood sugar in 15 minutes and repeat if blood sugar remains <70.  Continue metformin 500 mg by mouth twice daily with meals Continue insulin degludec Tyler Aas) 50 units subcutaneously at bedtime since fasting blood glucose within goal range. Continue dulaglutide (Trulicity) 3 mg subcutaneously once weekly Continue insulin aspart (Novolog) 8 units subcutaneously 5-15 minutes before each meal  Discussed with patient that once her A1c is improved, is she desires to simplify regimen, it may reasonable to begin discussing increase in basal insulin + GLP-1 agonist and decrease or  discontinuation of prandial insulin. Would recommend trying to avoid overbasalization (>0.5 units/kg of basal insulin; ~58 units).    Subjective: [name] is an 66 y.o. year old female who is a primary patient of Luking, Elayne Snare, MD.  The CCM team was consulted for assistance with disease management and care coordination needs.    Engaged with patient by telephone for follow up visit in response to provider referral for pharmacy case management and/or care coordination services.   Consent to Services:  The patient was given information about Chronic Care Management services, agreed to services, and gave verbal consent prior to initiation of services.  Please see initial visit note for detailed documentation.   Patient Care Team: Kathyrn Drown, MD as PCP - General (Family Medicine) Satira Sark, MD as PCP - Cardiology (Cardiology) Danie Binder, MD (Inactive) as Consulting Physician (Gastroenterology) Beryle Lathe, [facility] (Pharmacist)  Objective:  Lab Results  Component Value Date   CREATININE 1.24 (H) 03/27/2021   CREATININE 1.02 (H) 09/23/2020   CREATININE 1.04 (H) 05/04/2020    Lab Results  Component Value Date   HGBA1C 8.8 (H) 02/24/2021   Last diabetic Eye exam:  Lab Results  Component Value Date/Time   HMDIABEYEEXA Retinopathy (A) 04/26/2020 12:00 AM    Last diabetic Foot exam: No results found for: HMDIABFOOTEX      Component Value Date/Time   CHOL 142 03/27/2021 1202   TRIG 109 03/27/2021 1202   HDL 55 03/27/2021 1202   CHOLHDL 2.6 03/27/2021 1202   CHOLHDL 2.5 07/02/2014 0714   VLDL 35 07/02/2014 0714   LDLCALC 67  1202  ° ° °Hepatic Function Latest Ref Rng & Units 03/27/2021 09/23/2020 11/26/2019  °Total Protein 6.0 - 8.5 g/dL 6.9 6.6 6.7  °Albumin 3.8 - 4.8 g/dL 3.8 4.3 4.3  °AST 0 - 40 IU/L 14 17 19  °ALT 0 - 32 IU/L 13 14 17  °Alk Phosphatase 44 - 121 IU/L 85 102 101  °Total Bilirubin 0.0 - 1.2 mg/dL 0.4 0.5 0.6  °Bilirubin,  Direct 0.00 - 0.40 mg/dL 0.10 0.16 -  ° ° °Lab Results  °Component Value Date/Time  ° TSH 1.958 09/14/2013 12:01 AM  ° TSH 1.711 09/19/2012 10:20 AM  ° FREET4 1.37 09/14/2013 12:01 AM  ° ° °CBC Latest Ref Rng & Units 11/26/2019 01/07/2018 07/03/2017  °WBC 3.4 - 10.8 x10E3/uL 9.4 8.1 10.4  °Hemoglobin 11.1 - 15.9 g/dL 13.4 12.7 13.6  °Hematocrit 34.0 - 46.6 % 40.0 38.0 41.3  °Platelets 150 - 450 x10E3/uL 358 313 319  ° ° °No results found for: VD25OH ° °Clinical ASCVD: No  °The 10-year ASCVD risk score (Arnett DK, et al., 2019) is: 13.1% °  Values used to calculate the score: °    Age: 65 years °    Sex: Female °    Is Non-Hispanic African American: No °    Diabetic: Yes °    Tobacco smoker: No °    Systolic Blood Pressure: 120 mmHg °    Is BP treated: Yes °    HDL Cholesterol: 55 mg/dL °    Total Cholesterol: 142 mg/dL   ° ° °Social History  ° °Tobacco Use  °Smoking Status Never  °Smokeless Tobacco Never  ° °BP Readings from Last 3 Encounters:  °03/27/21 120/70  °09/09/20 124/72  °03/28/20 124/70  ° °Pulse Readings from Last 3 Encounters:  °03/27/21 83  °03/28/20 (!) 118  °03/24/20 78  ° °Wt Readings from Last 3 Encounters:  °03/27/21 259 lb 3.2 oz (117.6 kg)  °09/09/20 249 lb 6.4 oz (113.1 kg)  °03/28/20 247 lb (112 kg)  ° ° °Assessment: Review of patient past medical history, allergies, medications, health status, including review of consultants reports, laboratory and other test data, was performed as part of comprehensive evaluation and provision of chronic care management services.  ° °SDOH:  (Social Determinants of Health) assessments and interventions performed:  ° ° °CCM Care Plan ° °No Known Allergies ° °Medications Reviewed Today   ° ° Reviewed by Walston, Christopher J, RPH (Pharmacist) on 05/15/21 at 1608  Med List Status: <None>  ° °Medication Order Taking? Sig Documenting Provider Last Dose Status Informant  °acetaminophen (TYLENOL) 500 MG tablet [id number] Yes Take 1,000 mg by mouth every 6 (six) hours as  needed for moderate pain or headache. [provider] Taking Active Self  °ALPRAZolam (XANAX) 0.5 MG tablet [id number] Yes TAKE 1/2 TO 1 TABLET BY MOUTH TWICE DAILY AS NEEDED. Luking, Scott A, MD Taking Active   °amLODipine (NORVASC) 10 MG tablet [id number] Yes Take 1 tablet (10 mg total) by mouth daily. Luking, Scott A, MD Taking Active   °aspirin 81 MG tablet [id number] Yes Take 81 mg by mouth daily. [provider] Taking Active Self  °Blood Glucose Monitoring Suppl (ACCU-CHEK GUIDE) w/Device KIT [id number] Yes Use to check blood glucose three time daily Luking, Scott A, MD Taking Active   °Continuous Blood Gluc Sensor (DEXCOM G6 SENSOR) MISC [id number] Yes Every to monitor blood glucose. Change every 10 days. [provider] Taking Active Self  °Continuous Blood Gluc Transmit (DEXCOM G6 TRANSMITTER)   MISC [id number] Yes Every to monitor blood glucose. Change every 90 days. [provider] Taking Active Self  °Dulaglutide (TRULICITY) 3 MG/0.5ML SOPN [id number] Yes Inject 3 mg into the skin once a week. Luking, Scott A, MD Taking Active   °         °Med Note (WALSTON, CHRISTOPHER J   Mon May 15, 2021  4:01 PM)    °glucose blood test strip [id number] Yes Use as instructed to test blood glucose three time daily Luking, Scott A, MD Taking Active   °         °Med Note (WALSTON, CHRISTOPHER J   Fri Feb 24, 2021 10:31 AM) Accu-Chek  °insulin aspart (NOVOLOG FLEXPEN) 100 UNIT/ML FlexPen [id number] Yes Inject 8 Units into the skin 3 (three) times daily with meals. Luking, Scott A, MD Taking Active Self  °Insulin Pen Needle (SURE COMFORT PEN NEEDLES) 31G X 5 MM MISC [id number] Yes USE AS DIRECTED 4 TIMES DAILY. Luking, Scott A, MD Taking Active   °Lancets (ONETOUCH DELICA PLUS LANCET33G) MISC [id number] Yes USE AS DIRECTED. Luking, Scott A, MD Taking Active   °lisinopril (ZESTRIL) 40 MG tablet [id number] Yes Take 1 tablet (40 mg total) by mouth daily. Luking, Scott A, MD Taking Active   °metFORMIN  (GLUCOPHAGE-XR) 500 MG 24 hr tablet [id number] Yes Take 1 tablet (500 mg total) by mouth 2 (two) times daily with a meal. Luking, Scott A, MD Taking Active   °metoprolol tartrate (LOPRESSOR) 50 MG tablet [id number] Yes 1 bid Luking, Scott A, MD Taking Active   °pravastatin (PRAVACHOL) 20 MG tablet [id number] Yes TAKE 1 TABLET BY MOUTH AT BEDTIME FOR CHOLESTEROL. Luking, Scott A, MD Taking Active   °TRESIBA FLEXTOUCH 100 UNIT/ML FlexTouch Pen [id number] Yes INJECT 50 UNITS INTO THE SKIN AT BEDTIME. Luking, Scott A, MD Taking Active   °warfarin (COUMADIN) 5 MG tablet [id number] Yes TAKE 1/2 TABLET BY MOUTH DAILY EXCEPT 1 TABLET ON MONDAYS, WEDNESDAYS AND FRIDAYS OR AS DIRECTED McDowell, Samuel G, MD Taking Active   ° °  °  ° °  ° ° °Patient Active Problem List  ° Diagnosis Date Noted  ° Aortic aneurysm without rupture, unspecified portion of aorta (HCC) 03/27/2021  ° Visit for suture removal 03/28/2020  ° Laceration of right little finger 03/24/2020  ° Hyperlipidemia associated with type 2 diabetes mellitus (HCC) 12/08/2019  ° Encounter for screening colonoscopy 12/09/2017  ° Preoperative clearance 09/30/2017  ° ( 06/04/2016  ° Encounter for therapeutic drug monitoring 01/12/2016  ° S/P AVR 01/03/2016  ° Insomnia 06/15/2015  ° Thyroid nodule 09/15/2012  ° Renal insufficiency 09/15/2012  ° Aortic stenosis   ° Type 2 diabetes mellitus not at goal (HCC)   ° Hypertension   ° Hyperlipidemia   ° Restless leg syndrome   ° Ruptured disk   ° ° °Immunization History  °Administered Date(s) Administered  ° Fluad Quad(high Dose 65+) 12/08/2019, 12/28/2020  ° Influenza Split 12/18/2012  ° Influenza,inj,Quad PF,6+ Mos 11/30/2013, 12/30/2014, 01/10/2016, 11/26/2016, 01/07/2018, 12/26/2018  ° Influenza-Unspecified 02/03/2012, 01/07/2018  ° Moderna Sars-Covid-2 Vaccination 10/08/2019, 11/09/2019  ° Pneumococcal Conjugate-13 08/27/2019  ° Pneumococcal Polysaccharide-23 09/03/2007, 01/10/2016  ° Tdap 03/15/2020  ° Zoster Recombinat (Shingrix)  11/23/2019, 05/04/2020  ° ° °Conditions to be addressed/monitored: °HTN, HLD, and DMII ° °Care Plan : Medication Management  °Updates made by Walston, Christopher J, RPH since 05/15/2021 12:00 AM  °  ° °Problem: T2DM, HTN, HLD   °Priority: High  °Onset Date: 12/26/2020  °  ° °  Long-Range Goal: Disease Progression Prevention   °Start Date: 12/26/2020  °Expected End Date: 03/26/2021  °Recent Progress: On track  °Priority: High  °Note:   °Current Barriers:  °Unable to independently monitor therapeutic efficacy °Unable to achieve control of diabetes ° °Pharmacist Clinical Goal(s):  °Through collaboration with PharmD and provider, patient will:  °Achieve adherence to monitoring guidelines and medication adherence to achieve therapeutic efficacy °Achieve control of diabetes as evidenced by improved fasting blood sugar and improved A1c  ° °Interventions: °1:1 collaboration with Luking, Scott A, MD regarding development and update of comprehensive plan of care as evidenced by provider attestation and co-signature °Inter-disciplinary care team collaboration (see longitudinal plan of care) °Comprehensive medication review performed; medication list updated in electronic medical record ° °Type 2 Diabetes - Goal on Track (progressing): YES.: °Uncontrolled but improving per continuous glucose monitor; GMI over last 30 days is 6.9% with a time in range of 80% which are both at goal. Most recent A1c 10.8>8.8% but still above goal of <7% per ADA guidelines. °Current medications: metformin 500 mg by mouth twice daily with meals, dulaglutide (Trulicity) 3 mg subcutaneously weekly, and insulin degludec (Tresiba) 50 units subcutaneously at bedtime and insulin aspart (Novolog) 8 units subcutaneously 5-15 minutes before each meal °Recently changes: Trulicity increased to 3 mg weekly and Novolog decreased to 8 unit three time daily before meals.  °Intolerances: none °Taking medications as directed: yes °Side effects thought to be attributed  to current medication regimen: no °Denies hypoglycemic symptoms (sweaty and shaky). °Hypoglycemia prevention:  may consider °On a statin: yes °On aspirin 81 mg daily: yes °Last microalbumin/creatinine ratio: 21 (09/23/20); on an ACEi/ARB: yes °Current glucose readings:  see Dexcom Clarity report below for last 30 days. GMI 6.9% with time in rage 80%. Time above range 19%. Time below range <1%. Some hypoglycemia detected on Dexcom Clarity but patient reports she always checked with finger stick blood glucose and lowest true reading was in the 90s. She calibrated Dexcom and accuracy improved.  °Patient is instructed to monitor blood sugars continuously with Dexcom G6. Patient aware to check finger stick blood glucose if sensor glucose reading dose not match symptoms. Patient has $50 copay for supplies and is aware to contact [facility] supply monthly for refills. Patient has compatible smart phone and will not need a reader device.  °Discussed management of hypoglycemia. If blood sugar <70 at any time, treat with simple sugar such as 1/2 cup juice or regular soda or 3-4 glucose tablets. Recheck blood sugar in 15 minutes and repeat if blood sugar remains <70.  °Continue metformin 500 mg by mouth twice daily with meals °Continue insulin degludec (Tresiba) 50 units subcutaneously at bedtime since fasting blood glucose within goal range. °Continue dulaglutide (Trulicity) 3 mg subcutaneously once weekly °Continue insulin aspart (Novolog) 8 units subcutaneously 5-15 minutes before each meal  °Discussed with patient that once her A1c is improved, is she desires to simplify regimen, it may reasonable to begin discussing increase in basal insulin + GLP-1 agonist and decrease or discontinuation of prandial insulin. Would recommend trying to avoid overbasalization (>0.5 units/kg of basal insulin; ~58 units). ° ° ° °Hypertension - Condition stable. Not addressed this visit.: °Blood pressure under good control. Blood  pressure is at goal of <130/80 mmHg per 2017 AHA/ACC guidelines. °Current medications: lisinopril 40 mg by mouth once daily, amlodipine 10 mg by mouth once daily, and metoprolol tartrate 50 mg by mouth twice daily °Intolerances: none °Taking medications as directed: yes °Side effects   thought to be attributed to current medication regimen: no Current home blood pressure: patient has a blood pressure machine but does not currently check Continue lisinopril 40 mg by mouth once daily, amlodipine 10 mg by mouth once daily, and metoprolol tartrate 50 mg by mouth twice daily Encourage dietary sodium restriction/DASH diet Recommend home blood pressure monitoring to discuss at next visit  Hyperlipidemia - Goal Met.: Controlled. LDL at goal of <70 due to very high risk given diabetes + at least 1 additional major risk factor (hypertension) per 2020 AACE/ACE guidelines. Triglycerides at goal of <150 per 2020 AACE/ACE guidelines. Current medications: pravastatin 20 mg by mouth once daily Intolerances: none Taking medications as directed: yes Side effects thought to be attributed to current medication regimen: no Continue pravastatin 20 mg by mouth once daily Encourage dietary reduction of high fat containing foods such as butter, nuts, bacon, egg yolks, etc.  Patient Goals/Self-Care Activities Patient will:  Take medications as prescribed Check blood sugar continuously with Dexcom G6 Check blood pressure at least once daily, document, and provide at future appointments Engage in dietary modifications by less frequent dining out, decreased fat intake, and fewer sweetened foods & beverages  Follow Up Plan: Next PCP appointment scheduled for: 07/25/21     Medication Assistance: None required.  Patient affirms current coverage meets needs.  Patient's preferred pharmacy is:  [facility], [place] [address] Phone: [phone] Fax:  [phone]  Follow Up:  Patient agrees to Care Plan and Follow-up.  Plan: Next PCP appointment scheduled for: 07/25/21  Kennon Holter, PharmD, Para [place], CPP Clinical Pharmacist Practitioner [place] [phone]

## 2021-05-15 NOTE — Patient Instructions (Signed)
Lodema Pilot, ? ?It was great to talk to you today! ? ?Please call me with any questions or concerns. ? ?Visit Information ? ?Following are the goals we discussed today:  ? Goals Addressed   ? ?  ?  ?  ?  ? This Visit's Progress  ?  Medication Management     ?  Patient Goals/Self-Care Activities ?Patient will:  ?Take medications as prescribed ?Check blood sugar continuously with Dexcom G6 ?Check blood pressure at least once daily, document, and provide at future appointments ?Engage in dietary modifications by less frequent dining out, decreased fat intake, and fewer sweetened foods & beverages ? ? ? ? ?  ? ?  ?  ? ?Follow-up plan: Next PCP appointment scheduled for: 07/25/21 ? ?Patient verbalizes understanding of instructions and care plan provided today and agrees to view in [place]. Active MyChart status confirmed with patient.   ? ?Please call the care guide team at [phone] if you need to cancel or reschedule your appointment.  ? ?Kennon Holter, PharmD, BCACP, CPP ?Clinical Pharmacist Practitioner ?[place] ?[phone]  ?

## 2021-05-23 ENCOUNTER — Other Ambulatory Visit: Payer: Self-pay

## 2021-05-23 ENCOUNTER — Ambulatory Visit (INDEPENDENT_AMBULATORY_CARE_PROVIDER_SITE_OTHER): Payer: Medicare PPO

## 2021-05-23 VITALS — Ht 66.5 in | Wt 268.0 lb

## 2021-05-23 DIAGNOSIS — Z0000 Encounter for general adult medical examination without abnormal findings: Secondary | ICD-10-CM

## 2021-05-23 NOTE — Progress Notes (Signed)
? ?Subjective:  ? [name] is a 66 y.o. female who presents for an Initial Medicare Annual Wellness Visit. ? ?Review of Systems    ? ?Cardiac Risk Factors include: diabetes mellitus;hypertension;dyslipidemia;sedentary lifestyle;obesity (BMI >30kg/m2);advanced age (>94mn, >>75women) ? ?   ?Objective:  ?  ?Today's Vitals  ? 05/23/21 1537  ?Weight: 268 lb (121.6 kg)  ?Height: 5' 6.5" (1.689 m)  ? ?Body mass index is 42.61 kg/m?. ? ?Advanced Directives 05/23/2021 03/15/2020 01/09/2018 01/07/2018 01/04/2016 12/30/2015 12/16/2015  ?Does Patient Have a Medical Advance Directive? Yes No No No No No No  ?Type of [place] - - - - - -  ?Copy of [place]? Yes - validated most recent copy scanned in chart (See row information) - - - - - -  ?Would patient like information on creating a medical advance directive? - No - Patient declined No - Patient declined No - Patient declined No - patient declined information No - patient declined information -  ? ? ?Current Medications (verified) ?Outpatient Encounter Medications as of 05/23/2021  ?Medication Sig  ? acetaminophen (TYLENOL) 500 MG tablet Take 1,000 mg by mouth every 6 (six) hours as needed for moderate pain or headache.  ? ALPRAZolam (XANAX) 0.5 MG tablet TAKE 1/2 TO 1 TABLET BY MOUTH TWICE DAILY AS NEEDED.  ? amLODipine (NORVASC) 10 MG tablet Take 1 tablet (10 mg total) by mouth daily.  ? aspirin 81 MG tablet Take 81 mg by mouth daily.  ? Blood Glucose Monitoring Suppl (ACCU-CHEK GUIDE) w/Device KIT Use to check blood glucose three time daily  ? Continuous Blood Gluc Sensor (DEXCOM G6 SENSOR) MISC Every to monitor blood glucose. Change every 10 days.  ? Continuous Blood Gluc Transmit (DEXCOM G6 TRANSMITTER) MISC Every to monitor blood glucose. Change every 90 days.  ? Dulaglutide (TRULICITY) 3 MJT/7.0VXSOPN Inject 3 mg into the skin once a week.  ? glucose blood test strip Use as instructed to  test blood glucose three time daily  ? insulin aspart (NOVOLOG FLEXPEN) 100 UNIT/ML FlexPen Inject 8 Units into the skin 3 (three) times daily with meals.  ? Insulin Pen Needle (SURE COMFORT PEN NEEDLES) 31G X 5 MM MISC USE AS DIRECTED 4 TIMES DAILY.  ? Lancets (ONETOUCH DELICA PLUS [id number] MISC USE AS DIRECTED.  ? lisinopril (ZESTRIL) 40 MG tablet Take 1 tablet (40 mg total) by mouth daily.  ? metFORMIN (GLUCOPHAGE-XR) 500 MG 24 hr tablet Take 1 tablet (500 mg total) by mouth 2 (two) times daily with a meal.  ? metoprolol tartrate (LOPRESSOR) 50 MG tablet 1 bid  ? pravastatin (PRAVACHOL) 20 MG tablet TAKE 1 TABLET BY MOUTH AT BEDTIME FOR CHOLESTEROL.  ? TRESIBA FLEXTOUCH 100 UNIT/ML FlexTouch Pen INJECT 50 UNITS INTO THE SKIN AT BEDTIME.  ? warfarin (COUMADIN) 5 MG tablet TAKE 1/2 TABLET BY MOUTH DAILY EXCEPT 1 TABLET ON MONDAYS, WEDNESDAYS AND FRIDAYS OR AS DIRECTED  ? ?No facility-administered encounter medications on file as of 05/23/2021.  ? ? ?Allergies (verified) ?Patient has no known allergies.  ? ?History: ?Past Medical History:  ?Diagnosis Date  ? Aortic stenosis   ? Essential hypertension   ? Insomnia   ? Mixed hyperlipidemia   ? Nocturia   ? Numbness   ? Both feet  ? Restless leg syndrome   ? Ruptured disk   ? Type 2 diabetes mellitus ([place]   ? ?Past Surgical History:  ?Procedure Laterality Date  ?  AORTIC VALVE REPLACEMENT N/A 01/03/2016  ? Procedure: AORTIC VALVE REPLACEMENT (AVR);  Surgeon: Gaye Pollack, MD;  Location: [facility];  Service: Open Heart Surgery;  Laterality: N/A;  ? CARDIAC CATHETERIZATION N/A 12/16/2015  ? Procedure: Right/Left Heart Cath and Coronary Angiography;  Surgeon: Belva Crome, MD;  Location: [facility];  Service: Cardiovascular;  Laterality: N/A;  ? COLONOSCOPY  2007  ? Dr. Gala Romney: single anal papilla, otherwise unremarkable.   ? COLONOSCOPY WITH PROPOFOL N/A 01/09/2018  ? Procedure: COLONOSCOPY WITH PROPOFOL;  Surgeon: Daneil Dolin, MD;  [facility];   Service: Endoscopy;  Laterality: N/A;  8:15am  ? POLYPECTOMY  01/09/2018  ? Procedure: POLYPECTOMY;  Surgeon: Daneil Dolin, MD;  [facility];  Service: Endoscopy;;  rectal polyp  ? TEE WITHOUT CARDIOVERSION N/A 01/03/2016  ? Procedure: TRANSESOPHAGEAL ECHOCARDIOGRAM (TEE);  Surgeon: Gaye Pollack, MD;  Location: [facility];  Service: Open Heart Surgery;  Laterality: N/A;  ? TOE SURGERY    ? left foot middle   ? ?Family History  ?Problem Relation Age of Onset  ? Coronary artery disease Father   ?     Diagnosed in his 54s  ? Hypertension Father   ? Diabetes type II Mother   ? Hypertension Mother   ? Diabetes Mother   ? Diabetes type II Brother   ? Diabetes Brother   ? Hypertension Sister   ? Hyperlipidemia Sister   ? Colon cancer Neg Hx   ? ?Social History  ? ?Socioeconomic History  ? Marital status: Widowed  ?  Spouse name: Not on file  ? Number of children: Not on file  ? Years of education: Not on file  ? Highest education level: Not on file  ?Occupational History  ? Occupation: Network engineer  ?  Comment: School system  ?  Employer: [employer]  ?Tobacco Use  ? Smoking status: Never  ? Smokeless tobacco: Never  ?Vaping Use  ? Vaping Use: Never used  ?Substance and Sexual Activity  ? Alcohol use: No  ? Drug use: No  ? Sexual activity: Not on file  ?Other Topics Concern  ? Not on file  ?Social History Narrative  ? Not on file  ? ?Social Determinants of Health  ? ?Financial Resource Strain: Low Risk   ? Difficulty of Paying Living Expenses: Not hard at all  ?Food Insecurity: No Food Insecurity  ? Worried About Charity fundraiser in the Last Year: Never true  ? Ran Out of Food in the Last Year: Never true  ?Transportation Needs: No Transportation Needs  ? Lack of Transportation (Medical): No  ? Lack of Transportation (Non-Medical): No  ?Physical Activity: Inactive  ? Days of Exercise per Week: 0 days  ? Minutes of Exercise per Session: 0 min  ?Stress: No Stress Concern Present  ? Feeling of Stress : Not at  all  ?Social Connections: Moderately Integrated  ? Frequency of Communication with Friends and Family: More than three times a week  ? Frequency of Social Gatherings with Friends and Family: More than three times a week  ? Attends Religious Services: More than 4 times per year  ? Active Member of Clubs or Organizations: Yes  ? Attends Archivist Meetings: More than 4 times per year  ? Marital Status: Widowed  ? ? ?Tobacco Counseling ?Counseling given: Not Answered ? ? ?Clinical Intake: ? ?Pre-visit preparation completed: Yes ? ?  ? ?  ? ?BMI -  recorded: 2.61 ?Nutritional Status: BMI > 30  Obese ?Nutritional Risks: None ?Diabetes: Yes ? ?How often do you need to have someone help you when you read instructions, pamphlets, or other written materials from your doctor or pharmacy?: 1 - Never ? ?Diabetic?Nutrition Risk Assessment: ? ?Has the patient had any N/V/D within the last 2 months?  No  ?Does the patient have any non-healing wounds?  No  ?Has the patient had any unintentional weight loss or weight gain?  No  ? ?Diabetes: ? ?Is the patient diabetic?  Yes  ?If diabetic, was a CBG obtained today?  No  ?Did the patient bring in their glucometer from home?  No  ?How often do you monitor your CBG's? Continuous glucose monitor.  ? ?Financial Strains and Diabetes Management: ? ?Are you having any financial strains with the device, your supplies or your medication? No .  ?Does the patient want to be seen by Chronic Care Management for management of their diabetes?  No  ?Would the patient like to be referred to a Nutritionist or for Diabetic Management?  No  ? ?Diabetic Exams: ? ?Diabetic Eye Exam: Completed 04/26/2020. Overdue for diabetic eye exam. Pt has been advised about the importance in completing this exam.  ? ?Diabetic Foot Exam: Completed 09/09/2020. Pt has been advised about the importance in completing this exam.  ? ?Interpreter Needed?: No ? ?Information entered by :: mj Mada Sadik, lpn ? ? ?Activities of  Daily Living ?In your present state of health, do you have any difficulty performing the following activities: 05/23/2021  ?Hearing? N  ?Vision? N  ?Difficulty concentrating or making decisions? N  ?Walking o

## 2021-05-23 NOTE — Patient Instructions (Signed)
Ms. Camey , ?Thank you for taking time to come for your Medicare Wellness Visit. I appreciate your ongoing commitment to your health goals. Please review the following plan we discussed and let me know if I can assist you in the future.  ? ?Screening recommendations/referrals: ?Colonoscopy: Done 01/19/2018 Repeat in 10 years ? ?Mammogram: Done 07/13/2020 Repeat annually ? ?Bone Density: Done 09/10/2019 Repeat every 2 years ? ?Recommended yearly ophthalmology/optometry visit for glaucoma screening and checkup ?Recommended yearly dental visit for hygiene and checkup ? ?Vaccinations: ?Influenza vaccine: Done 12/28/2020 Repeat annually ? ?Pneumococcal vaccine: Done 08/27/2019, 01/20/2016 and 09/03/2007.  ?Tdap vaccine: Done 03/15/2020 Repeat in 10 years ? ?Shingles vaccine: Done 05/04/2020 and 11/23/2019   ?Covid-19:Done 11/09/2019 and 10/08/2019 ? ?Advanced directives: Copy scanned into chart.  ? ?Conditions/risks identified: Aim for 30 minutes of exercise or brisk walking, 6-8 glasses of water, and 5 servings of fruits and vegetables each day. ? ? ?Next appointment: Follow up in one year for your annual wellness visit 2024. ? ? ?Preventive Care 68 Years and Older, Female ?Preventive care refers to lifestyle choices and visits with your health care provider that can promote health and wellness. ?What does preventive care include? ?A yearly physical exam. This is also called an annual well check. ?Dental exams once or twice a year. ?Routine eye exams. Ask your health care provider how often you should have your eyes checked. ?Personal lifestyle choices, including: ?Daily care of your teeth and gums. ?Regular physical activity. ?Eating a healthy diet. ?Avoiding tobacco and drug use. ?Limiting alcohol use. ?Practicing safe sex. ?Taking low-dose aspirin every day. ?Taking vitamin and mineral supplements as recommended by your health care provider. ?What happens during an annual well check? ?The services and screenings done by your  health care provider during your annual well check will depend on your age, overall health, lifestyle risk factors, and family history of disease. ?Counseling  ?Your health care provider may ask you questions about your: ?Alcohol use. ?Tobacco use. ?Drug use. ?Emotional well-being. ?Home and relationship well-being. ?Sexual activity. ?Eating habits. ?History of falls. ?Memory and ability to understand (cognition). ?Work and work Statistician. ?Reproductive health. ?Screening  ?You may have the following tests or measurements: ?Height, weight, and BMI. ?Blood pressure. ?Lipid and cholesterol levels. These may be checked every 5 years, or more frequently if you are over 65 years old. ?Skin check. ?Lung cancer screening. You may have this screening every year starting at age 68 if you have a 30-pack-year history of smoking and currently smoke or have quit within the past 15 years. ?Fecal occult blood test (FOBT) of the stool. You may have this test every year starting at age 68. ?Flexible sigmoidoscopy or colonoscopy. You may have a sigmoidoscopy every 5 years or a colonoscopy every 10 years starting at age 55. ?Hepatitis C blood test. ?Hepatitis B blood test. ?Sexually transmitted disease (STD) testing. ?Diabetes screening. This is done by checking your blood sugar (glucose) after you have not eaten for a while (fasting). You may have this done every 1-3 years. ?Bone density scan. This is done to screen for osteoporosis. You may have this done starting at age 68. ?Mammogram. This may be done every 1-2 years. Talk to your health care provider about how often you should have regular mammograms. ?Talk with your health care provider about your test results, treatment options, and if necessary, the need for more tests. ?Vaccines  ?Your health care provider may recommend certain vaccines, such as: ?Influenza vaccine. This is recommended  every year. ?Tetanus, diphtheria, and acellular pertussis (Tdap, Td) vaccine. You may  need a Td booster every 10 years. ?Zoster vaccine. You may need this after age 57. ?Pneumococcal 13-valent conjugate (PCV13) vaccine. One dose is recommended after age 53. ?Pneumococcal polysaccharide (PPSV23) vaccine. One dose is recommended after age 57. ?Talk to your health care provider about which screenings and vaccines you need and how often you need them. ?This information is not intended to replace advice given to you by your health care provider. Make sure you discuss any questions you have with your health care provider. ?Document Released: 03/18/2015 Document Revised: 11/09/2015 Document Reviewed: 12/21/2014 ?Elsevier Interactive Patient Education ? 2017 [place]. ? ?Fall Prevention in the Home ?Falls can cause injuries. They can happen to people of all ages. There are many things you can do to make your home safe and to help prevent falls. ?What can I do on the outside of my home? ?Regularly fix the edges of walkways and driveways and fix any cracks. ?Remove anything that might make you trip as you walk through a door, such as a raised step or threshold. ?Trim any bushes or trees on the path to your home. ?Use bright outdoor lighting. ?Clear any walking paths of anything that might make someone trip, such as rocks or tools. ?Regularly check to see if handrails are loose or broken. Make sure that both sides of any steps have handrails. ?Any raised decks and porches should have guardrails on the edges. ?Have any leaves, snow, or ice cleared regularly. ?Use sand or salt on walking paths during winter. ?Clean up any spills in your garage right away. This includes oil or grease spills. ?What can I do in the bathroom? ?Use night lights. ?Install grab bars by the toilet and in the tub and shower. Do not use towel bars as grab bars. ?Use non-skid mats or decals in the tub or shower. ?If you need to sit down in the shower, use a plastic, non-slip stool. ?Keep the floor dry. Clean up any water that spills on  the floor as soon as it happens. ?Remove soap buildup in the tub or shower regularly. ?Attach bath mats securely with double-sided non-slip rug tape. ?Do not have throw rugs and other things on the floor that can make you trip. ?What can I do in the bedroom? ?Use night lights. ?Make sure that you have a light by your bed that is easy to reach. ?Do not use any sheets or blankets that are too big for your bed. They should not hang down onto the floor. ?Have a firm chair that has side arms. You can use this for support while you get dressed. ?Do not have throw rugs and other things on the floor that can make you trip. ?What can I do in the kitchen? ?Clean up any spills right away. ?Avoid walking on wet floors. ?Keep items that you use a lot in easy-to-reach places. ?If you need to reach something above you, use a strong step stool that has a grab bar. ?Keep electrical cords out of the way. ?Do not use floor polish or wax that makes floors slippery. If you must use wax, use non-skid floor wax. ?Do not have throw rugs and other things on the floor that can make you trip. ?What can I do with my stairs? ?Do not leave any items on the stairs. ?Make sure that there are handrails on both sides of the stairs and use them. Fix handrails that are broken  or loose. Make sure that handrails are as long as the stairways. ?Check any carpeting to make sure that it is firmly attached to the stairs. Fix any carpet that is loose or worn. ?Avoid having throw rugs at the top or bottom of the stairs. If you do have throw rugs, attach them to the floor with carpet tape. ?Make sure that you have a light switch at the top of the stairs and the bottom of the stairs. If you do not have them, ask someone to add them for you. ?What else can I do to help prevent falls? ?Wear shoes that: ?Do not have high heels. ?Have rubber bottoms. ?Are comfortable and fit you well. ?Are closed at the toe. Do not wear sandals. ?If you use a stepladder: ?Make sure  that it is fully opened. Do not climb a closed stepladder. ?Make sure that both sides of the stepladder are locked into place. ?Ask someone to hold it for you, if possible. ?Clearly mark and make sure that y

## 2021-05-24 DIAGNOSIS — B078 Other viral warts: Secondary | ICD-10-CM | POA: Diagnosis not present

## 2021-05-29 ENCOUNTER — Other Ambulatory Visit: Payer: Self-pay | Admitting: Family Medicine

## 2021-05-29 DIAGNOSIS — E119 Type 2 diabetes mellitus without complications: Secondary | ICD-10-CM

## 2021-05-29 NOTE — Telephone Encounter (Signed)
6 months on both please ?

## 2021-05-31 ENCOUNTER — Ambulatory Visit (INDEPENDENT_AMBULATORY_CARE_PROVIDER_SITE_OTHER): Payer: Medicare PPO | Admitting: *Deleted

## 2021-05-31 DIAGNOSIS — Z5181 Encounter for therapeutic drug level monitoring: Secondary | ICD-10-CM

## 2021-05-31 DIAGNOSIS — Z952 Presence of prosthetic heart valve: Secondary | ICD-10-CM | POA: Diagnosis not present

## 2021-05-31 LAB — POCT INR: INR: 2.7 (ref 2.0–3.0)

## 2021-05-31 NOTE — Patient Instructions (Signed)
Continue warfarin 1 tablet daily except 1/2 tablet on Mondays ?Recheck in 6 weeks  ?Continue greens ?

## 2021-06-02 DIAGNOSIS — I10 Essential (primary) hypertension: Secondary | ICD-10-CM | POA: Diagnosis not present

## 2021-06-02 DIAGNOSIS — E785 Hyperlipidemia, unspecified: Secondary | ICD-10-CM

## 2021-06-02 DIAGNOSIS — E1169 Type 2 diabetes mellitus with other specified complication: Secondary | ICD-10-CM

## 2021-06-02 DIAGNOSIS — E119 Type 2 diabetes mellitus without complications: Secondary | ICD-10-CM

## 2021-06-15 DIAGNOSIS — E119 Type 2 diabetes mellitus without complications: Secondary | ICD-10-CM | POA: Diagnosis not present

## 2021-06-23 ENCOUNTER — Encounter: Payer: Self-pay | Admitting: Cardiology

## 2021-06-23 ENCOUNTER — Ambulatory Visit: Payer: Medicare PPO | Admitting: Cardiology

## 2021-06-23 VITALS — BP 148/68 | HR 90 | Ht 67.0 in | Wt 260.2 lb

## 2021-06-23 DIAGNOSIS — E782 Mixed hyperlipidemia: Secondary | ICD-10-CM

## 2021-06-23 DIAGNOSIS — I10 Essential (primary) hypertension: Secondary | ICD-10-CM | POA: Diagnosis not present

## 2021-06-23 DIAGNOSIS — Z952 Presence of prosthetic heart valve: Secondary | ICD-10-CM

## 2021-06-23 NOTE — Progress Notes (Signed)
? ? ?Cardiology Office Note ? ?Date: 06/23/2021  ? ?ID: [name], DOB 1956/02/14, MRN [id number] ? ?PCP:  Kathyrn Drown, MD  ?Cardiologist:  Rozann Lesches, MD ?Electrophysiologist:  None  ? ?Chief Complaint  ?Patient presents with  ? Cardiac follow-up  ? ? ?History of Present Illness: ?[name] is a 66 y.o. female last assessed via telehealth encounter in December 2020.  She is here for a follow-up visit.  She is now working part-time with the school system, now also a widow.  She seems to be doing reasonably well overall.  She states that she has been taking her medications regularly, very consistent with her Coumadin.  She reports NYHA class II dyspnea. ? ?She is on Coumadin with follow-up in anticoagulation clinic.  Recent INR was 2.7.  No spontaneous bleeding problems. ? ?She is status post mechanical AVR in 2017 (21 mm St. Jude Regent mechanical valve).  We discussed getting a follow-up echocardiogram for surveillance at this point.  I personally reviewed her ECG today which shows sinus rhythm with prolonged PR interval. ? ?I reviewed her most recent lab work per PCP. ? ?Past Medical History:  ?Diagnosis Date  ? Aortic stenosis   ? Essential hypertension   ? Insomnia   ? Mixed hyperlipidemia   ? Nocturia   ? Numbness   ? Both feet  ? Restless leg syndrome   ? Ruptured disk   ? Type 2 diabetes mellitus ([place])   ? ? ?Past Surgical History:  ?Procedure Laterality Date  ? AORTIC VALVE REPLACEMENT N/A 01/03/2016  ? Procedure: AORTIC VALVE REPLACEMENT (AVR);  Surgeon: Gaye Pollack, MD;  Location: [facility];  Service: Open Heart Surgery;  Laterality: N/A;  ? CARDIAC CATHETERIZATION N/A 12/16/2015  ? Procedure: Right/Left Heart Cath and Coronary Angiography;  Surgeon: Belva Crome, MD;  Location: [facility];  Service: Cardiovascular;  Laterality: N/A;  ? COLONOSCOPY  2007  ? Dr. Gala Romney: single anal papilla, otherwise unremarkable.   ? COLONOSCOPY WITH PROPOFOL N/A 01/09/2018  ? Procedure: COLONOSCOPY  WITH PROPOFOL;  Surgeon: Daneil Dolin, MD;  [facility];  Service: Endoscopy;  Laterality: N/A;  8:15am  ? POLYPECTOMY  01/09/2018  ? Procedure: POLYPECTOMY;  Surgeon: Daneil Dolin, MD;  [facility];  Service: Endoscopy;;  rectal polyp  ? TEE WITHOUT CARDIOVERSION N/A 01/03/2016  ? Procedure: TRANSESOPHAGEAL ECHOCARDIOGRAM (TEE);  Surgeon: Gaye Pollack, MD;  Location: [facility];  Service: Open Heart Surgery;  Laterality: N/A;  ? TOE SURGERY    ? left foot middle   ? ? ?Current Outpatient Medications  ?Medication Sig Dispense Refill  ? acetaminophen (TYLENOL) 500 MG tablet Take 1,000 mg by mouth every 6 (six) hours as needed for moderate pain or headache.    ? ALPRAZolam (XANAX) 0.5 MG tablet TAKE 1/2 TO 1 TABLET BY MOUTH TWICE DAILY AS NEEDED. 30 tablet 2  ? amLODipine (NORVASC) 10 MG tablet Take 1 tablet (10 mg total) by mouth daily. 90 tablet 1  ? aspirin 81 MG tablet Take 81 mg by mouth daily.    ? Blood Glucose Monitoring Suppl (ACCU-CHEK GUIDE) w/Device KIT Use to check blood glucose three time daily 1 kit 0  ? Continuous Blood Gluc Sensor (DEXCOM G6 SENSOR) MISC Every to monitor blood glucose. Change every 10 days.    ? Continuous Blood Gluc Transmit (DEXCOM G6 TRANSMITTER) MISC Every to monitor blood glucose. Change every 90 days.    ?  Dulaglutide (TRULICITY) 3 WU/9.8JX SOPN Inject 3 mg into the skin once a week. 6 mL 5  ? glucose blood test strip Use as instructed to test blood glucose three time daily 100 each 5  ? insulin aspart (NOVOLOG FLEXPEN) 100 UNIT/ML FlexPen Inject 8 Units into the skin 3 (three) times daily with meals. 15 mL 1  ? insulin degludec (TRESIBA FLEXTOUCH) 100 UNIT/ML FlexTouch Pen INJECT 50 UNITS INTO THE SKIN AT BEDTIME. 15 mL 5  ? Insulin Pen Needle (SURE COMFORT PEN NEEDLES) 31G X 5 MM MISC USE AS DIRECTED 4 TIMES DAILY. 100 each 5  ? Lancets (ONETOUCH DELICA PLUS BJYNWG95A) MISC USE AS DIRECTED. 100 each 0  ? lisinopril (ZESTRIL) 40 MG tablet Take 1 tablet  (40 mg total) by mouth daily. 90 tablet 1  ? metFORMIN (GLUCOPHAGE-XR) 500 MG 24 hr tablet Take 1 tablet (500 mg total) by mouth 2 (two) times daily with a meal. 60 tablet 5  ? metoprolol tartrate (LOPRESSOR) 50 MG tablet 1 bid 180 tablet 1  ? pravastatin (PRAVACHOL) 20 MG tablet TAKE 1 TABLET BY MOUTH AT BEDTIME FOR CHOLESTEROL. 90 tablet 1  ? warfarin (COUMADIN) 5 MG tablet TAKE 1/2 TABLET BY MOUTH DAILY EXCEPT 1 TABLET ON MONDAYS, WEDNESDAYS AND FRIDAYS OR AS DIRECTED 90 tablet 6  ? ?No current facility-administered medications for this visit.  ? ?Allergies:  Patient has no known allergies.  ? ?ROS: No palpitations or syncope. ? ?Physical Exam: ?VS:  BP (!) 148/68   Pulse 90   Ht _0  (1.702 m)   Wt 260 lb 3.2 oz (118 kg)   SpO2 98%   BMI 40.75 kg/m? , BMI Body mass index is 40.75 kg/m?. ? ?Wt Readings from Last 3 Encounters:  ?06/23/21 260 lb 3.2 oz (118 kg)  ?05/23/21 268 lb (121.6 kg)  ?03/27/21 259 lb 3.2 oz (117.6 kg)  ?  ?General: Patient appears comfortable at rest. ?HEENT: Conjunctiva and lids normal. ?Neck: Supple, no elevated JVP or carotid bruits, no thyromegaly. ?Lungs: Clear to auscultation, nonlabored breathing at rest. ?Cardiac: Regular rate and rhythm, no S3, crisp prosthetic sound in S2 with 1/6 systolic murmur. ?Extremities: No pitting edema. ? ?ECG:  An ECG dated 09/30/2017 was personally reviewed today and demonstrated:  Sinus bradycardia. ? ?Recent Labwork: ?03/27/2021: ALT 13; AST 14; BUN 28; Creatinine, Ser 1.24; Potassium 4.9; Sodium 139  ?   ?Component Value Date/Time  ? CHOL 142 03/27/2021 1202  ? TRIG 109 03/27/2021 1202  ? HDL 55 03/27/2021 1202  ? CHOLHDL 2.6 03/27/2021 1202  ? CHOLHDL 2.5 07/02/2014 0714  ? VLDL 35 07/02/2014 0714  ? Glenvil 67 03/27/2021 1202  ? ? ?Other Studies Reviewed Today: ? ?TEE 01/03/2016: ?Result status: Final result  ?Left ventricle: LV systolic function is normal with an EF of 55-60%. ?Septum: No Patent Foramen Ovale present. ?Left atrium: Patent  foramen ovale not present. ?Aortic valve: The valve is possible bicuspid. Severe valve thickening present. Severe valve calcification present. Severely decreased leaflet separation. Severe stenosis. ?Mitral valve: Trace regurgitation.  ?  ?Cardiac catheterization 12/16/2015: ?Prox LAD to Mid LAD lesion, 40 %stenosed. ?Ost 1st Diag to 1st Diag lesion, 25 %stenosed. ?There is severe aortic valve stenosis. ?Hemodynamic findings consistent with pulmonary hypertension. ?  ?Severe aortic stenosis documented by hemodynamics. Mean gradient 53.8 mmHg and AVA .68 cm2. ?Widely patent coronary arteries. 20-40% irregularities in the proximal to mid LAD. ?Normal right heart pressures. ?LV angiogram not performed due to CKD. Recent LVEF 60%  by echocardiogram. ? ?Assessment and Plan: ? ?1.  History of aortic stenosis status post St. Jude mechanical AVR in 2017.  She continues on Coumadin with follow-up in the anticoagulation clinic, last INR therapeutic range.  We will obtain an echocardiogram for surveillance at this point. ? ?2.  Mixed hyperlipidemia, she continues on Pravachol with follow-up by Dr. Wolfgang Phoenix.  Last LDL 67. ? ?3.  Essential hypertension, on Norvasc, lisinopril, and Lopressor with followed by Dr. Wolfgang Phoenix. ? ?Medication Adjustments/Labs and Tests Ordered: ?Current medicines are reviewed at length with the patient today.  Concerns regarding medicines are outlined above.  ? ?Tests Ordered: ?Orders Placed This Encounter  ?Procedures  ? EKG 12-Lead  ? ECHOCARDIOGRAM COMPLETE  ? ? ?Medication Changes: ?No orders of the defined types were placed in this encounter. ? ? ?Disposition:  Follow up  1 year. ? ?Signed, ?Satira Sark, MD, [facility] ?06/23/2021 3:41 PM    ?[place] [facility] ?[place], [place], [place] [address] ?Phone: [phone]; Fax: [phone]  ?

## 2021-06-23 NOTE — Patient Instructions (Signed)
Medication Instructions:  Your physician recommends that you continue on your current medications as directed. Please refer to the Current Medication list given to you today.   Labwork: none  Testing/Procedures: Your physician has requested that you have an echocardiogram. Echocardiography is a painless test that uses sound waves to create images of your heart. It provides your doctor with information about the size and shape of your heart and how well your heart's chambers and valves are working. This procedure takes approximately one hour. There are no restrictions for this procedure.   Follow-Up: Your physician recommends that you schedule a follow-up appointment in: 1 year  Any Other Special Instructions Will Be Listed Below (If Applicable).  You will receive a reminder call in about 10 months reminding you to schedule your appointment. If you don't receive this call, please contact our office.  If you need a refill on your cardiac medications before your next appointment, please call your pharmacy.  

## 2021-07-12 ENCOUNTER — Ambulatory Visit (INDEPENDENT_AMBULATORY_CARE_PROVIDER_SITE_OTHER): Payer: Medicare PPO | Admitting: *Deleted

## 2021-07-12 DIAGNOSIS — Z5181 Encounter for therapeutic drug level monitoring: Secondary | ICD-10-CM | POA: Diagnosis not present

## 2021-07-12 DIAGNOSIS — Z952 Presence of prosthetic heart valve: Secondary | ICD-10-CM

## 2021-07-12 LAB — POCT INR: INR: 2.7 (ref 2.0–3.0)

## 2021-07-12 NOTE — Patient Instructions (Signed)
Continue warfarin 1 tablet daily except 1/2 tablet on Mondays ?Recheck in 7 weeks  ?Continue greens ?

## 2021-07-17 ENCOUNTER — Other Ambulatory Visit: Payer: Self-pay | Admitting: Cardiology

## 2021-07-17 DIAGNOSIS — I359 Nonrheumatic aortic valve disorder, unspecified: Secondary | ICD-10-CM

## 2021-07-19 DIAGNOSIS — E119 Type 2 diabetes mellitus without complications: Secondary | ICD-10-CM | POA: Diagnosis not present

## 2021-07-24 ENCOUNTER — Other Ambulatory Visit (HOSPITAL_COMMUNITY): Payer: Self-pay | Admitting: Family Medicine

## 2021-07-24 DIAGNOSIS — Z1231 Encounter for screening mammogram for malignant neoplasm of breast: Secondary | ICD-10-CM

## 2021-07-25 ENCOUNTER — Encounter: Payer: Self-pay | Admitting: Cardiology

## 2021-07-25 ENCOUNTER — Encounter: Payer: Self-pay | Admitting: Family Medicine

## 2021-07-25 ENCOUNTER — Ambulatory Visit: Payer: Medicare PPO | Admitting: Family Medicine

## 2021-07-25 VITALS — BP 126/60 | HR 86 | Temp 96.2°F | Ht 67.0 in | Wt 257.0 lb

## 2021-07-25 DIAGNOSIS — E785 Hyperlipidemia, unspecified: Secondary | ICD-10-CM | POA: Diagnosis not present

## 2021-07-25 DIAGNOSIS — R300 Dysuria: Secondary | ICD-10-CM | POA: Diagnosis not present

## 2021-07-25 DIAGNOSIS — I719 Aortic aneurysm of unspecified site, without rupture: Secondary | ICD-10-CM | POA: Diagnosis not present

## 2021-07-25 DIAGNOSIS — E119 Type 2 diabetes mellitus without complications: Secondary | ICD-10-CM | POA: Diagnosis not present

## 2021-07-25 DIAGNOSIS — E1169 Type 2 diabetes mellitus with other specified complication: Secondary | ICD-10-CM

## 2021-07-25 LAB — POCT URINALYSIS DIP (MANUAL ENTRY)
Bilirubin, UA: NEGATIVE
Glucose, UA: NEGATIVE mg/dL
Ketones, POC UA: NEGATIVE mg/dL
Nitrite, UA: POSITIVE — AB
Protein Ur, POC: NEGATIVE mg/dL
Spec Grav, UA: 1.01 (ref 1.010–1.025)
Urobilinogen, UA: 0.2 E.U./dL
pH, UA: 6 (ref 5.0–8.0)

## 2021-07-25 MED ORDER — AMLODIPINE BESYLATE 10 MG PO TABS: 10.0000 mg | ORAL_TABLET | Freq: Every day | ORAL | 1 refills | Status: DC

## 2021-07-25 MED ORDER — NOVOLOG FLEXPEN 100 UNIT/ML ~~LOC~~ SOPN: 8.0000 [IU] | PEN_INJECTOR | Freq: Three times a day (TID) | SUBCUTANEOUS | 1 refills | Status: DC

## 2021-07-25 MED ORDER — TRULICITY 3 MG/0.5ML ~~LOC~~ SOAJ: 3.0000 mg | SUBCUTANEOUS | 5 refills | Status: DC

## 2021-07-25 MED ORDER — METOPROLOL TARTRATE 50 MG PO TABS: ORAL_TABLET | ORAL | 1 refills | Status: DC

## 2021-07-25 MED ORDER — PRAVASTATIN SODIUM 20 MG PO TABS: ORAL_TABLET | ORAL | 1 refills | Status: DC

## 2021-07-25 MED ORDER — METFORMIN HCL ER 500 MG PO TB24: 500.0000 mg | ORAL_TABLET | Freq: Two times a day (BID) | ORAL | 5 refills | Status: DC

## 2021-07-25 MED ORDER — NITROFURANTOIN MONOHYD MACRO 100 MG PO CAPS: 100.0000 mg | ORAL_CAPSULE | Freq: Two times a day (BID) | ORAL | 0 refills | Status: DC

## 2021-07-25 MED ORDER — TRIAMCINOLONE ACETONIDE 0.1 % EX CREA: 1.0000 "application " | TOPICAL_CREAM | Freq: Two times a day (BID) | CUTANEOUS | 0 refills | Status: DC

## 2021-07-25 MED ORDER — LISINOPRIL 40 MG PO TABS: 40.0000 mg | ORAL_TABLET | Freq: Every day | ORAL | 1 refills | Status: DC

## 2021-07-25 MED ORDER — TRESIBA FLEXTOUCH 100 UNIT/ML ~~LOC~~ SOPN: PEN_INJECTOR | SUBCUTANEOUS | 5 refills | Status: DC

## 2021-07-25 NOTE — Progress Notes (Signed)
   Subjective:    Patient ID: [name], female    DOB: March 28, 1955, 66 y.o.   MRN: [id number]  Diabetes She presents for her follow-up diabetic visit. She has type 2 diabetes mellitus. Current diabetic treatments: novolog, tresiba, metformin, trulicity.  Hypertension This is a chronic problem. Treatments tried: amlodipine, lisinopril, metoprolol.  Dysuria  This is a new problem. The current episode started 1 to 4 weeks ago.     Review of Systems  Genitourinary:  Positive for dysuria.      Objective:   Physical Exam  General-in no acute distress Eyes-no discharge Lungs-respiratory rate normal, CTA CV-no murmurs,RRR Extremities skin warm dry no edema Neuro grossly normal Behavior normal, alert       Assessment & Plan:   1. Dysuria Does have UTI Macrobid ordered Send for culture Follow-up if any ongoing troubles  - POCT urinalysis dipstick  2. Hyperlipidemia associated with type 2 diabetes mellitus ([place]) Continue medication watch diet stay active - Lipid panel - Hemoglobin A1c - Hepatic function panel - Basic metabolic panel  3. Type 2 diabetes mellitus not at goal [facility]) Hopefully under decent control.  Patient relates she is monitoring her sugars with the Dexcom and doing well with this - Dulaglutide (TRULICITY) 3 GX/2.1JH SOPN; Inject 3 mg into the skin once a week.  Dispense: 6 mL; Refill: 5 - insulin aspart (NOVOLOG FLEXPEN) 100 UNIT/ML FlexPen; Inject 8 Units into the skin 3 (three) times daily with meals.  Dispense: 15 mL; Refill: 1 - insulin degludec (TRESIBA FLEXTOUCH) 100 UNIT/ML FlexTouch Pen; INJECT 50 UNITS INTO THE SKIN AT BEDTIME.  Dispense: 15 mL; Refill: 5 - metFORMIN (GLUCOPHAGE-XR) 500 MG 24 hr tablet; Take 1 tablet (500 mg total) by mouth 2 (two) times daily with a meal.  Dispense: 60 tablet; Refill: 5 - Lipid panel - Hemoglobin A1c - Hepatic function panel - Basic metabolic panel  Morbid obesity important to watch diet stay physically  active  Thoracic aneurysm Last scan was 2019 by cardiology Needs updated scan We will set her up for a CT chest thoracic aneurysm protocol. Patient will need to stop metformin 48 hours before and maintain off of metformin for at least 72 hours afterwards.

## 2021-07-27 DIAGNOSIS — E119 Type 2 diabetes mellitus without complications: Secondary | ICD-10-CM | POA: Diagnosis not present

## 2021-07-27 DIAGNOSIS — E1169 Type 2 diabetes mellitus with other specified complication: Secondary | ICD-10-CM | POA: Diagnosis not present

## 2021-07-27 DIAGNOSIS — E785 Hyperlipidemia, unspecified: Secondary | ICD-10-CM | POA: Diagnosis not present

## 2021-07-28 LAB — LIPID PANEL
Chol/HDL Ratio: 2.4 ratio (ref 0.0–4.4)
Cholesterol, Total: 127 mg/dL (ref 100–199)
HDL: 54 mg/dL (ref >39)
LDL Chol Calc (NIH): 52 mg/dL (ref 0–99)
Triglycerides: 117 mg/dL (ref 0–149)
VLDL Cholesterol Cal: 21 mg/dL (ref 5–40)

## 2021-07-28 LAB — BASIC METABOLIC PANEL
BUN/Creatinine Ratio: 16 (ref 12–28)
BUN: 17 mg/dL (ref 8–27)
CO2: 24 mmol/L (ref 20–29)
Calcium: 9.4 mg/dL (ref 8.7–10.3)
Chloride: 104 mmol/L (ref 96–106)
Creatinine, Ser: 1.07 mg/dL — ABNORMAL HIGH (ref 0.57–1.00)
Glucose: 106 mg/dL — ABNORMAL HIGH (ref 70–99)
Potassium: 4.7 mmol/L (ref 3.5–5.2)
Sodium: 140 mmol/L (ref 134–144)
eGFR: 57 mL/min/{1.73_m2} — ABNORMAL LOW (ref >59)

## 2021-07-28 LAB — HEPATIC FUNCTION PANEL
ALT: 10 IU/L (ref 0–32)
AST: 13 IU/L (ref 0–40)
Albumin: 4.2 g/dL (ref 3.8–4.8)
Alkaline Phosphatase: 87 IU/L (ref 44–121)
Bilirubin Total: 0.4 mg/dL (ref 0.0–1.2)
Bilirubin, Direct: 0.14 mg/dL (ref 0.00–0.40)
Total Protein: 7 g/dL (ref 6.0–8.5)

## 2021-07-28 LAB — HEMOGLOBIN A1C
Est. average glucose Bld gHb Est-mCnc: 143 mg/dL
Hgb A1c MFr Bld: 6.6 % — ABNORMAL HIGH (ref 4.8–5.6)

## 2021-07-29 ENCOUNTER — Telehealth: Payer: Self-pay | Admitting: Family Medicine

## 2021-07-29 DIAGNOSIS — I719 Aortic aneurysm of unspecified site, without rupture: Secondary | ICD-10-CM

## 2021-07-29 LAB — URINE CULTURE

## 2021-07-29 NOTE — Telephone Encounter (Signed)
Nurses Thoracic aneurysm Last scan was 2019 by cardiology Needs updated scan We will set her up for a CT chest thoracic aneurysm protocol. Patient will need to stop metformin 48 hours before and maintain off of metformin for at least 72 hours afterwards.    Revision History    Please talk with patient On previous visit I did a chart review afterwards and cardiology did a CAT scan a few years ago and recommended a follow-up CAT scan within 2 to 3 years She is due Please see above

## 2021-08-01 NOTE — Telephone Encounter (Signed)
CT ordered in Epic. Patient notified and advised provider's recommendations for holding metformin. Patient verbalized understanding.

## 2021-08-03 ENCOUNTER — Ambulatory Visit (INDEPENDENT_AMBULATORY_CARE_PROVIDER_SITE_OTHER): Payer: Medicare PPO

## 2021-08-03 DIAGNOSIS — I359 Nonrheumatic aortic valve disorder, unspecified: Secondary | ICD-10-CM | POA: Diagnosis not present

## 2021-08-03 LAB — ECHOCARDIOGRAM COMPLETE
AR max vel: 1.48 cm2
AV Area VTI: 1.64 cm2
AV Area mean vel: 1.53 cm2
AV Mean grad: 7 mmHg
AV Peak grad: 13.4 mmHg
Ao pk vel: 1.83 m/s
Area-P 1/2: 3.34 cm2
Calc EF: 66.3 %
S' Lateral: 2.41 cm
Single Plane A2C EF: 65.8 %
Single Plane A4C EF: 67.2 %

## 2021-08-10 ENCOUNTER — Ambulatory Visit (HOSPITAL_COMMUNITY)
Admission: RE | Admit: 2021-08-10 | Discharge: 2021-08-10 | Disposition: A | Discharge status: Home / Self Care | Payer: Medicare PPO | Source: Ambulatory Visit | Attending: Family Medicine | Admitting: Family Medicine

## 2021-08-10 DIAGNOSIS — Z1231 Encounter for screening mammogram for malignant neoplasm of breast: Secondary | ICD-10-CM | POA: Diagnosis not present

## 2021-08-21 DIAGNOSIS — E119 Type 2 diabetes mellitus without complications: Secondary | ICD-10-CM | POA: Diagnosis not present

## 2021-08-28 ENCOUNTER — Ambulatory Visit (INDEPENDENT_AMBULATORY_CARE_PROVIDER_SITE_OTHER): Payer: Medicare PPO | Admitting: *Deleted

## 2021-08-28 DIAGNOSIS — Z5181 Encounter for therapeutic drug level monitoring: Secondary | ICD-10-CM | POA: Diagnosis not present

## 2021-08-28 DIAGNOSIS — Z952 Presence of prosthetic heart valve: Secondary | ICD-10-CM | POA: Diagnosis not present

## 2021-08-28 LAB — POCT INR: INR: 3.3 — AB (ref 2.0–3.0)

## 2021-09-01 ENCOUNTER — Ambulatory Visit (HOSPITAL_COMMUNITY)
Admission: RE | Admit: 2021-09-01 | Discharge: 2021-09-01 | Disposition: A | Discharge status: Home / Self Care | Payer: Medicare PPO | Source: Ambulatory Visit | Attending: Family Medicine | Admitting: Family Medicine

## 2021-09-01 DIAGNOSIS — I719 Aortic aneurysm of unspecified site, without rupture: Secondary | ICD-10-CM | POA: Insufficient documentation

## 2021-09-01 DIAGNOSIS — K8020 Calculus of gallbladder without cholecystitis without obstruction: Secondary | ICD-10-CM | POA: Diagnosis not present

## 2021-09-01 MED ORDER — IOHEXOL 350 MG/ML SOLN
75.0000 mL | Freq: Once | INTRAVENOUS | Status: AC | PRN
  Administered 2021-09-01: 75 mL via INTRAVENOUS

## 2021-09-11 ENCOUNTER — Ambulatory Visit (HOSPITAL_COMMUNITY): Payer: Medicare PPO

## 2021-09-21 DIAGNOSIS — E119 Type 2 diabetes mellitus without complications: Secondary | ICD-10-CM | POA: Diagnosis not present

## 2021-09-22 ENCOUNTER — Encounter: Payer: Self-pay | Admitting: Nurse Practitioner

## 2021-09-22 ENCOUNTER — Ambulatory Visit: Payer: Medicare PPO | Admitting: Nurse Practitioner

## 2021-09-22 VITALS — BP 132/68 | HR 91 | Temp 97.5°F | Wt 257.2 lb

## 2021-09-22 DIAGNOSIS — H5789 Other specified disorders of eye and adnexa: Secondary | ICD-10-CM

## 2021-09-22 NOTE — Patient Instructions (Addendum)
Zaditor (generic) Environmental health practitioner or Claritin Wash eyes with baby shampoo Cerave

## 2021-09-22 NOTE — Progress Notes (Signed)
   Subjective:    Patient ID: [name], female    DOB: 1955-08-14, 66 y.o.   MRN: [id number]  HPI Presents for complaints of itching around the left eye for the past several days.  Mainly occurring around the eye.  Has been matted in the morning for the past 2 days with a slightly beige drainage.  No eye pain.  No change in her vision.  No history of injury.  No fever.  No upper respiratory symptoms.  No known contacts.  Has not tried anything for her symptoms at this time.        Objective:   Physical Exam NAD.  Alert, oriented.  Conjunctiva minimally injected bilaterally no significant difference in the left eye.  Minimal edema of the left upper eyelid with some faint pink discoloration.  No tenderness with palpation around the eye.  Pupils equal and responsive, no obvious foreign body.  Tangential lighting no abrasions noted. Today's Vitals   09/22/21 0946  BP: 132/68  Pulse: 91  Temp: (!) 97.5 F (36.4 C)  TempSrc: Temporal  SpO2: 98%  Weight: 257 lb 3.2 oz (116.7 kg)   Body mass index is 40.28 kg/m.        Assessment & Plan:  Itch of left eye  Explained to patient that this could be a temporary problem or early signs of blepharitis.  Recommend OTC allergy eyedrop such as Zaditor or Pataday.  Also daily nonsedating antihistamine such as Allegra or Claritin.  Warm compresses to her eyes if any drainage.  Wash her eyes with baby shampoo.  Call back if symptoms worsen or persist.  Warning signs reviewed.

## 2021-10-09 ENCOUNTER — Ambulatory Visit (INDEPENDENT_AMBULATORY_CARE_PROVIDER_SITE_OTHER): Payer: Medicare PPO | Admitting: *Deleted

## 2021-10-09 DIAGNOSIS — Z1211 Encounter for screening for malignant neoplasm of colon: Secondary | ICD-10-CM | POA: Diagnosis not present

## 2021-10-09 DIAGNOSIS — Z5181 Encounter for therapeutic drug level monitoring: Secondary | ICD-10-CM

## 2021-10-09 DIAGNOSIS — Z952 Presence of prosthetic heart valve: Secondary | ICD-10-CM | POA: Diagnosis not present

## 2021-10-09 LAB — POCT INR: INR: 4.1 — AB (ref 2.0–3.0)

## 2021-10-09 NOTE — Patient Instructions (Signed)
Hold warfarin today then decrease dose to 1 tablet daily except 1/2 tablet on Mondays, Wednesdays and Fridays Recheck in 3 weeks  Continue greens

## 2021-10-23 DIAGNOSIS — E119 Type 2 diabetes mellitus without complications: Secondary | ICD-10-CM | POA: Diagnosis not present

## 2021-10-30 ENCOUNTER — Ambulatory Visit: Payer: Medicare PPO | Attending: Adult Health | Admitting: *Deleted

## 2021-10-30 DIAGNOSIS — Z5181 Encounter for therapeutic drug level monitoring: Secondary | ICD-10-CM

## 2021-10-30 DIAGNOSIS — Z952 Presence of prosthetic heart valve: Secondary | ICD-10-CM | POA: Diagnosis not present

## 2021-10-30 LAB — POCT INR: INR: 2.5 (ref 2.0–3.0)

## 2021-10-30 NOTE — Patient Instructions (Signed)
Continue warfarin 1 tablet daily except 1/2 tablet on Mondays, Wednesdays and Fridays Recheck in 4 weeks.  Continue greens 

## 2021-11-28 ENCOUNTER — Ambulatory Visit: Payer: Medicare PPO | Attending: Adult Health | Admitting: *Deleted

## 2021-11-28 DIAGNOSIS — Z952 Presence of prosthetic heart valve: Secondary | ICD-10-CM | POA: Diagnosis not present

## 2021-11-28 DIAGNOSIS — Z5181 Encounter for therapeutic drug level monitoring: Secondary | ICD-10-CM

## 2021-11-28 LAB — POCT INR: INR: 2.5 (ref 2.0–3.0)

## 2021-11-28 NOTE — Patient Instructions (Signed)
Continue warfarin 1 tablet daily except 1/2 tablet on Mondays, Wednesdays and Fridays Recheck in 5 weeks.  Continue greens 

## 2021-11-29 ENCOUNTER — Other Ambulatory Visit: Payer: Self-pay | Admitting: Cardiology

## 2021-11-29 NOTE — Telephone Encounter (Signed)
Refill request for warfarin:  Last INR was 2.5 on 11/28/21 Next INR due 01/02/22 LOV was 06/23/21  Myles Gip MD  Refill approved.

## 2021-12-06 ENCOUNTER — Telehealth: Payer: Self-pay | Admitting: Family Medicine

## 2021-12-06 NOTE — Telephone Encounter (Signed)
Nurses-patient has a office visit in November very important for her to do A1c, lipid, liver, metabolic 7, urine ACR before that follow-up visit  Diabetes, hypertension, hyperlipidemia

## 2021-12-07 ENCOUNTER — Other Ambulatory Visit: Payer: Self-pay

## 2021-12-07 DIAGNOSIS — I10 Essential (primary) hypertension: Secondary | ICD-10-CM

## 2021-12-07 DIAGNOSIS — E119 Type 2 diabetes mellitus without complications: Secondary | ICD-10-CM

## 2021-12-07 DIAGNOSIS — E1169 Type 2 diabetes mellitus with other specified complication: Secondary | ICD-10-CM

## 2021-12-07 DIAGNOSIS — Z79899 Other long term (current) drug therapy: Secondary | ICD-10-CM

## 2021-12-07 NOTE — Telephone Encounter (Signed)
Patient made aware per drs notes. 

## 2021-12-19 DIAGNOSIS — E785 Hyperlipidemia, unspecified: Secondary | ICD-10-CM | POA: Diagnosis not present

## 2021-12-19 DIAGNOSIS — Z79899 Other long term (current) drug therapy: Secondary | ICD-10-CM | POA: Diagnosis not present

## 2021-12-19 DIAGNOSIS — E119 Type 2 diabetes mellitus without complications: Secondary | ICD-10-CM | POA: Diagnosis not present

## 2021-12-19 DIAGNOSIS — I10 Essential (primary) hypertension: Secondary | ICD-10-CM | POA: Diagnosis not present

## 2021-12-19 DIAGNOSIS — E1169 Type 2 diabetes mellitus with other specified complication: Secondary | ICD-10-CM | POA: Diagnosis not present

## 2021-12-20 LAB — BASIC METABOLIC PANEL
BUN/Creatinine Ratio: 19 (ref 12–28)
BUN: 21 mg/dL (ref 8–27)
CO2: 21 mmol/L (ref 20–29)
Calcium: 8.7 mg/dL (ref 8.7–10.3)
Chloride: 104 mmol/L (ref 96–106)
Creatinine, Ser: 1.12 mg/dL — ABNORMAL HIGH (ref 0.57–1.00)
Glucose: 109 mg/dL — ABNORMAL HIGH (ref 70–99)
Potassium: 4.6 mmol/L (ref 3.5–5.2)
Sodium: 140 mmol/L (ref 134–144)
eGFR: 54 mL/min/{1.73_m2} — ABNORMAL LOW (ref >59)

## 2021-12-20 LAB — LIPID PANEL
Chol/HDL Ratio: 2.4 ratio (ref 0.0–4.4)
Cholesterol, Total: 133 mg/dL (ref 100–199)
HDL: 55 mg/dL (ref >39)
LDL Chol Calc (NIH): 56 mg/dL (ref 0–99)
Triglycerides: 126 mg/dL (ref 0–149)
VLDL Cholesterol Cal: 22 mg/dL (ref 5–40)

## 2021-12-20 LAB — HEMOGLOBIN A1C
Est. average glucose Bld gHb Est-mCnc: 140 mg/dL
Hgb A1c MFr Bld: 6.5 % — ABNORMAL HIGH (ref 4.8–5.6)

## 2021-12-20 LAB — HEPATIC FUNCTION PANEL
ALT: 10 IU/L (ref 0–32)
AST: 17 IU/L (ref 0–40)
Albumin: 4 g/dL (ref 3.9–4.9)
Alkaline Phosphatase: 84 IU/L (ref 44–121)
Bilirubin Total: 0.3 mg/dL (ref 0.0–1.2)
Bilirubin, Direct: 0.12 mg/dL (ref 0.00–0.40)
Total Protein: 6.4 g/dL (ref 6.0–8.5)

## 2021-12-20 LAB — MICROALBUMIN / CREATININE URINE RATIO
Creatinine, Urine: 82.4 mg/dL
Microalb/Creat Ratio: 5 mg/g creat (ref 0–29)
Microalbumin, Urine: 4.4 ug/mL

## 2021-12-29 ENCOUNTER — Other Ambulatory Visit: Payer: Self-pay

## 2021-12-29 ENCOUNTER — Telehealth: Payer: Self-pay | Admitting: Family Medicine

## 2021-12-29 DIAGNOSIS — E119 Type 2 diabetes mellitus without complications: Secondary | ICD-10-CM

## 2021-12-29 MED ORDER — TRULICITY 3 MG/0.5ML ~~LOC~~ SOAJ: 3.0000 mg | SUBCUTANEOUS | 1 refills | Status: DC

## 2021-12-29 MED ORDER — METFORMIN HCL ER 500 MG PO TB24: 500.0000 mg | ORAL_TABLET | Freq: Two times a day (BID) | ORAL | 1 refills | Status: DC

## 2021-12-29 MED ORDER — NOVOLOG FLEXPEN 100 UNIT/ML ~~LOC~~ SOPN: 8.0000 [IU] | PEN_INJECTOR | Freq: Three times a day (TID) | SUBCUTANEOUS | 1 refills | Status: DC

## 2021-12-29 NOTE — Telephone Encounter (Signed)
Patient is in [place] taking care of her grand daughter for the next two months and needing refills on medicationsmetFORMIN (GLUCOPHAGE-XR) 500 MG 24 hr tablet [[id number] Dulaglutide (TRULICITY) 3 YZ/7.0DU SOPN [[id number] Insulin [facility] [id number]

## 2022-01-03 ENCOUNTER — Ambulatory Visit: Payer: Medicare PPO | Attending: Adult Health | Admitting: *Deleted

## 2022-01-03 DIAGNOSIS — Z5181 Encounter for therapeutic drug level monitoring: Secondary | ICD-10-CM

## 2022-01-03 DIAGNOSIS — Z952 Presence of prosthetic heart valve: Secondary | ICD-10-CM

## 2022-01-03 LAB — POCT INR: INR: 5.8 — AB (ref 2.0–3.0)

## 2022-01-03 NOTE — Patient Instructions (Signed)
Hold warfarin x 3 days then resume 1 tablet daily except 1/2 tablet on Mondays, Wednesdays and Fridays Recheck in 1 week in [place].  Call or fax results to me   Continue greens

## 2022-01-08 ENCOUNTER — Ambulatory Visit: Payer: Self-pay | Admitting: Family Medicine

## 2022-01-09 ENCOUNTER — Other Ambulatory Visit: Payer: Self-pay | Admitting: Cardiology

## 2022-01-09 ENCOUNTER — Encounter: Payer: Self-pay | Admitting: Cardiology

## 2022-01-09 DIAGNOSIS — Z5181 Encounter for therapeutic drug level monitoring: Secondary | ICD-10-CM | POA: Diagnosis not present

## 2022-01-09 DIAGNOSIS — Z952 Presence of prosthetic heart valve: Secondary | ICD-10-CM | POA: Diagnosis not present

## 2022-01-10 ENCOUNTER — Ambulatory Visit (INDEPENDENT_AMBULATORY_CARE_PROVIDER_SITE_OTHER): Payer: Medicare PPO | Admitting: *Deleted

## 2022-01-10 DIAGNOSIS — Z952 Presence of prosthetic heart valve: Secondary | ICD-10-CM | POA: Diagnosis not present

## 2022-01-10 DIAGNOSIS — Z5181 Encounter for therapeutic drug level monitoring: Secondary | ICD-10-CM

## 2022-01-10 LAB — PROTIME-INR
INR: 1.8 — ABNORMAL HIGH (ref 0.9–1.2)
Prothrombin Time: 18.6 s — ABNORMAL HIGH (ref 9.1–12.0)

## 2022-01-10 NOTE — Patient Instructions (Addendum)
Take warfarin 1 tablet tonight then resume 1 tablet daily except 1/2 tablet on Mondays, Wednesdays and Fridays Recheck in 3 week in [place].  Call or fax results to me   Continue greens Pt notified via [place]

## 2022-01-23 ENCOUNTER — Other Ambulatory Visit: Payer: Self-pay | Admitting: Family Medicine

## 2022-01-23 DIAGNOSIS — E119 Type 2 diabetes mellitus without complications: Secondary | ICD-10-CM

## 2022-01-24 NOTE — Telephone Encounter (Signed)
So this states that it is nonformulary they are requesting an alternative-I have no idea what is covered by her insurance company.  Please find out what is covered then we can choose something different Also she is due for a follow-up visit we recommend lipid, liver, A1c, met 7, urine ACR we also recommend a follow-up office visit within the next 30 days

## 2022-01-28 ENCOUNTER — Other Ambulatory Visit: Payer: Self-pay | Admitting: Family Medicine

## 2022-01-28 MED ORDER — TRULICITY 3 MG/0.5ML ~~LOC~~ SOAJ: 3.0000 mg | SUBCUTANEOUS | 2 refills | Status: DC

## 2022-01-30 ENCOUNTER — Encounter: Payer: Self-pay | Admitting: Family Medicine

## 2022-01-30 DIAGNOSIS — E119 Type 2 diabetes mellitus without complications: Secondary | ICD-10-CM

## 2022-01-31 ENCOUNTER — Other Ambulatory Visit: Payer: Self-pay | Admitting: Cardiology

## 2022-01-31 DIAGNOSIS — Z5181 Encounter for therapeutic drug level monitoring: Secondary | ICD-10-CM | POA: Diagnosis not present

## 2022-01-31 DIAGNOSIS — Z952 Presence of prosthetic heart valve: Secondary | ICD-10-CM | POA: Diagnosis not present

## 2022-01-31 MED ORDER — TRESIBA FLEXTOUCH 100 UNIT/ML ~~LOC~~ SOPN: PEN_INJECTOR | SUBCUTANEOUS | 0 refills | Status: DC

## 2022-02-01 ENCOUNTER — Ambulatory Visit (INDEPENDENT_AMBULATORY_CARE_PROVIDER_SITE_OTHER): Payer: Medicare PPO | Admitting: *Deleted

## 2022-02-01 DIAGNOSIS — Z952 Presence of prosthetic heart valve: Secondary | ICD-10-CM

## 2022-02-01 DIAGNOSIS — Z5181 Encounter for therapeutic drug level monitoring: Secondary | ICD-10-CM | POA: Diagnosis not present

## 2022-02-01 LAB — PROTIME-INR
INR: 4.3 — ABNORMAL HIGH (ref 0.9–1.2)
Prothrombin Time: 42 s — ABNORMAL HIGH (ref 9.1–12.0)

## 2022-02-01 LAB — POCT INR: INR: 4.3 — AB (ref 2.0–3.0)

## 2022-02-01 NOTE — Patient Instructions (Signed)
Hold warfarin tonight then decrease dose to 1/2 tablet daily except 1 tablet on Sundays, Tuesdays and Thursdays Recheck on 02/19/22 in [place].  Call or fax results to me   Continue greens Pt notified via [place]

## 2022-02-02 ENCOUNTER — Other Ambulatory Visit: Payer: Self-pay | Admitting: Family Medicine

## 2022-02-02 DIAGNOSIS — E119 Type 2 diabetes mellitus without complications: Secondary | ICD-10-CM

## 2022-02-12 ENCOUNTER — Other Ambulatory Visit: Payer: Self-pay | Admitting: *Deleted

## 2022-02-12 MED ORDER — WARFARIN SODIUM 5 MG PO TABS: ORAL_TABLET | ORAL | 1 refills | Status: DC

## 2022-02-14 NOTE — Telephone Encounter (Signed)
1.  Please talk with patient #2 when making this switch she will need to reduce the amount Confirm she is utilizing 50 units of Tresiba? If so when going to [place] we would recommend 40 units.  She may have a month supply with 3 refills Please have patient monitor her sugars closely She needs to do a follow-up visit in January.

## 2022-02-16 ENCOUNTER — Other Ambulatory Visit: Payer: Self-pay

## 2022-02-16 DIAGNOSIS — E119 Type 2 diabetes mellitus without complications: Secondary | ICD-10-CM

## 2022-02-16 MED ORDER — LANTUS SOLOSTAR 100 UNIT/ML ~~LOC~~ SOPN: 40.0000 [IU] | PEN_INJECTOR | Freq: Every day | SUBCUTANEOUS | 3 refills | Status: DC

## 2022-02-19 ENCOUNTER — Other Ambulatory Visit: Payer: Self-pay | Admitting: Family Medicine

## 2022-02-19 ENCOUNTER — Other Ambulatory Visit: Payer: Self-pay | Admitting: Cardiology

## 2022-02-19 DIAGNOSIS — Z5181 Encounter for therapeutic drug level monitoring: Secondary | ICD-10-CM | POA: Diagnosis not present

## 2022-02-19 DIAGNOSIS — Z952 Presence of prosthetic heart valve: Secondary | ICD-10-CM | POA: Diagnosis not present

## 2022-02-19 DIAGNOSIS — E119 Type 2 diabetes mellitus without complications: Secondary | ICD-10-CM

## 2022-02-19 LAB — PROTIME-INR: INR: 2.4 — AB (ref <=1.20)

## 2022-02-22 ENCOUNTER — Telehealth: Payer: Self-pay | Admitting: *Deleted

## 2022-02-22 ENCOUNTER — Ambulatory Visit (INDEPENDENT_AMBULATORY_CARE_PROVIDER_SITE_OTHER): Payer: Medicare PPO | Admitting: *Deleted

## 2022-02-22 DIAGNOSIS — Z5181 Encounter for therapeutic drug level monitoring: Secondary | ICD-10-CM | POA: Diagnosis not present

## 2022-02-22 DIAGNOSIS — Z952 Presence of prosthetic heart valve: Secondary | ICD-10-CM | POA: Diagnosis not present

## 2022-02-22 NOTE — Patient Instructions (Addendum)
Description   Spoke with pt and advised to continue taking warfarin 1/2 tablet daily except 1 tablet on Sundays, Tuesdays and Thursdays. Recheck on 03/19/22 in [facility] at [facility]. Continue leafy veggies consistently.

## 2022-02-22 NOTE — Telephone Encounter (Addendum)
Called pt since Anticoagulation Results are overdue. SPt states she went on Monday to [employer] [facility] in [facility] and has not received the results to date.   Called the [employer] and will have them faxed over to [place] to address. Automated system will fax over results in next 15 minutes. Will await.   Received results and called pt to address INR results. Please refer to Anticoagulation Encounter from today for details.

## 2022-02-23 LAB — PROTIME-INR
INR: 2.4 — ABNORMAL HIGH (ref 0.9–1.2)
Prothrombin Time: 24.5 s — ABNORMAL HIGH (ref 9.1–12.0)

## 2022-03-08 ENCOUNTER — Telehealth (INDEPENDENT_AMBULATORY_CARE_PROVIDER_SITE_OTHER): Payer: Medicare PPO | Admitting: Family Medicine

## 2022-03-08 VITALS — Wt 240.0 lb

## 2022-03-08 DIAGNOSIS — E785 Hyperlipidemia, unspecified: Secondary | ICD-10-CM

## 2022-03-08 DIAGNOSIS — I719 Aortic aneurysm of unspecified site, without rupture: Secondary | ICD-10-CM

## 2022-03-08 DIAGNOSIS — E119 Type 2 diabetes mellitus without complications: Secondary | ICD-10-CM

## 2022-03-08 DIAGNOSIS — E1169 Type 2 diabetes mellitus with other specified complication: Secondary | ICD-10-CM

## 2022-03-08 MED ORDER — PRAVASTATIN SODIUM 20 MG PO TABS: ORAL_TABLET | ORAL | 1 refills | Status: DC

## 2022-03-08 MED ORDER — METOPROLOL TARTRATE 50 MG PO TABS: ORAL_TABLET | ORAL | 1 refills | Status: DC

## 2022-03-08 MED ORDER — AMLODIPINE BESYLATE 10 MG PO TABS
10.0000 mg | ORAL_TABLET | Freq: Every day | ORAL | 1 refills | Status: DC
Start: 2022-03-08 — End: 2022-03-14

## 2022-03-08 MED ORDER — LISINOPRIL 40 MG PO TABS: 40.0000 mg | ORAL_TABLET | Freq: Every day | ORAL | 1 refills | Status: DC

## 2022-03-08 NOTE — Progress Notes (Signed)
   Subjective:    Patient ID: [name], female    DOB: 1955-04-02, 67 y.o.   MRN: [id number]  HPI I connected with  [name] on 03/08/22 by a video enabled telemedicine application and verified that I am speaking with the correct person using two identifiers.   I discussed the limitations of evaluation and management by telemedicine. The patient expressed understanding and agreed to proceed.   Patient is to discuss diabetic medication follow up. Patient states no concerns or issues today  Virtual Visit via Video Note  I connected with [name] on 03/08/22 at 10:00 AM EST by a video enabled telemedicine application and verified that I am speaking with the correct person using two identifiers.  Location: Patient: [place] with her daughter who got a transplant Provider: Office   I discussed the limitations of evaluation and management by telemedicine and the availability of in person appointments. The patient expressed understanding and agreed to proceed.  History of Present Illness:    Observations/Objective:   Assessment and Plan:   Follow Up Instructions:    I discussed the assessment and treatment plan with the patient. The patient was provided an opportunity to ask questions and all were answered. The patient agreed with the plan and demonstrated an understanding of the instructions.   The patient was advised to call back or seek an in-person evaluation if the symptoms worsen or if the condition fails to improve as anticipated.  I provided 15 minutes of non-face-to-face time during this encounter.   Sallee Lange, MD      Review of Systems     Objective:   Physical Exam Today's visit was via telephone Physical exam was not possible for this visit  We attempted virtual video several times would not be able to connect.  Patient needed office visit in order to update her status regarding diabetes hypertension hyperlipidemia obesity and heart  disease.  Refills were sent in      Assessment & Plan:  Refills were sent in Labs ordered Patient overall feels she is doing well with the diabetes no low sugar spells She will connect with [place] in the springtime she states she will set up an office visit follow-up

## 2022-03-09 ENCOUNTER — Other Ambulatory Visit: Payer: Self-pay | Admitting: *Deleted

## 2022-03-09 ENCOUNTER — Other Ambulatory Visit: Payer: Self-pay | Admitting: Family Medicine

## 2022-03-09 DIAGNOSIS — Z79899 Other long term (current) drug therapy: Secondary | ICD-10-CM

## 2022-03-09 DIAGNOSIS — E119 Type 2 diabetes mellitus without complications: Secondary | ICD-10-CM

## 2022-03-09 DIAGNOSIS — E1169 Type 2 diabetes mellitus with other specified complication: Secondary | ICD-10-CM

## 2022-03-09 NOTE — Progress Notes (Signed)
Blood work ordered in EPIC 

## 2022-03-12 NOTE — Telephone Encounter (Signed)
Nurses-first it is important to inform the patient that this medicine is on backorder at her pharmacy. If she would like to for [place] to try different pharmacy to stay on the same medicine we can do so Otherwise we would have to change her blood pressure medicine There is no other generic similar to this we would have to switch to a different medicine Please let the patient know she needs to let us know what direction she would like for [place] to take

## 2022-03-14 MED ORDER — AMLODIPINE BESYLATE 10 MG PO TABS: 10.0000 mg | ORAL_TABLET | Freq: Every day | ORAL | 1 refills | Status: DC

## 2022-03-14 NOTE — Telephone Encounter (Signed)
Nurses-please call the patient.  I would prefer for her to stay on this medicine.  Is there a possibility she can find amlodipine at another pharmacy down in that region?  If not we would have to change her over to Procardia XL which is not the same medicine but hopefully would do reasonably well but in my opinion would not be as good as amlodipine please explain the situation to the patient it is quite possible that there is just [facility] but not at other [facility].  Obviously if there is a Event organiser and other pharmacies then we would need to change the medicine.

## 2022-03-14 NOTE — Telephone Encounter (Signed)
Pt replied via my chart; medication sent to [facility] as requested by patient

## 2022-03-15 NOTE — Telephone Encounter (Signed)
Spoke with patient she stated prescription was sent in to [facility] and shipment is coming in today. Patient will call office if she has any issues.

## 2022-03-19 ENCOUNTER — Other Ambulatory Visit: Payer: Self-pay | Admitting: Cardiology

## 2022-03-19 ENCOUNTER — Encounter: Payer: Self-pay | Admitting: Cardiology

## 2022-03-19 DIAGNOSIS — Z5181 Encounter for therapeutic drug level monitoring: Secondary | ICD-10-CM | POA: Diagnosis not present

## 2022-03-19 DIAGNOSIS — Z952 Presence of prosthetic heart valve: Secondary | ICD-10-CM | POA: Diagnosis not present

## 2022-03-23 LAB — PROTIME-INR
INR: 2.2 — ABNORMAL HIGH (ref 0.9–1.2)
Prothrombin Time: 22.6 s — ABNORMAL HIGH (ref 9.1–12.0)

## 2022-03-26 ENCOUNTER — Ambulatory Visit (INDEPENDENT_AMBULATORY_CARE_PROVIDER_SITE_OTHER): Payer: Medicare PPO | Admitting: *Deleted

## 2022-03-26 DIAGNOSIS — Z952 Presence of prosthetic heart valve: Secondary | ICD-10-CM

## 2022-03-26 DIAGNOSIS — Z5181 Encounter for therapeutic drug level monitoring: Secondary | ICD-10-CM | POA: Diagnosis not present

## 2022-03-26 NOTE — Patient Instructions (Signed)
Spoke with pt and advised to continue taking warfarin 1/2 tablet daily except 1 tablet on Sundays, Tuesdays and Thursdays. Recheck in 4 weeks either in [place] or here is she is back home.  Continue leafy veggies consistently.

## 2022-04-10 ENCOUNTER — Ambulatory Visit: Payer: Medicare PPO | Attending: Internal Medicine | Admitting: *Deleted

## 2022-04-10 DIAGNOSIS — Z952 Presence of prosthetic heart valve: Secondary | ICD-10-CM

## 2022-04-10 DIAGNOSIS — Z5181 Encounter for therapeutic drug level monitoring: Secondary | ICD-10-CM | POA: Diagnosis not present

## 2022-04-10 LAB — POCT INR: INR: 2.5 (ref 2.0–3.0)

## 2022-04-10 NOTE — Patient Instructions (Signed)
Continue 1/2 tablet daily except 1 tablet on Sundays, Tuesdays and Thursdays. Recheck in 4 weeks  Continue leafy veggies consistently.

## 2022-05-03 DIAGNOSIS — E113393 Type 2 diabetes mellitus with moderate nonproliferative diabetic retinopathy without macular edema, bilateral: Secondary | ICD-10-CM | POA: Diagnosis not present

## 2022-05-03 LAB — HM DIABETES EYE EXAM

## 2022-05-14 ENCOUNTER — Ambulatory Visit: Payer: Medicare PPO | Attending: Cardiology | Admitting: *Deleted

## 2022-05-14 DIAGNOSIS — Z952 Presence of prosthetic heart valve: Secondary | ICD-10-CM | POA: Diagnosis not present

## 2022-05-14 DIAGNOSIS — Z5181 Encounter for therapeutic drug level monitoring: Secondary | ICD-10-CM

## 2022-05-14 LAB — POCT INR: INR: 3.7 — AB (ref 2.0–3.0)

## 2022-05-14 NOTE — Patient Instructions (Signed)
Hold warfarin tonight then resume 1/2 tablet daily except 1 tablet on Sundays, Tuesdays and Thursdays. Recheck in 3 weeks  Continue leafy veggies consistently.

## 2022-05-31 DIAGNOSIS — Z79899 Other long term (current) drug therapy: Secondary | ICD-10-CM | POA: Diagnosis not present

## 2022-05-31 DIAGNOSIS — E119 Type 2 diabetes mellitus without complications: Secondary | ICD-10-CM | POA: Diagnosis not present

## 2022-05-31 DIAGNOSIS — E1169 Type 2 diabetes mellitus with other specified complication: Secondary | ICD-10-CM | POA: Diagnosis not present

## 2022-05-31 DIAGNOSIS — E785 Hyperlipidemia, unspecified: Secondary | ICD-10-CM | POA: Diagnosis not present

## 2022-06-01 ENCOUNTER — Encounter: Payer: Self-pay | Admitting: *Deleted

## 2022-06-03 LAB — HEPATIC FUNCTION PANEL
ALT: 11 IU/L (ref 0–32)
AST: 15 IU/L (ref 0–40)
Albumin: 4.3 g/dL (ref 3.9–4.9)
Alkaline Phosphatase: 93 IU/L (ref 44–121)
Bilirubin Total: 0.4 mg/dL (ref 0.0–1.2)
Bilirubin, Direct: 0.16 mg/dL (ref 0.00–0.40)
Total Protein: 7 g/dL (ref 6.0–8.5)

## 2022-06-03 LAB — BASIC METABOLIC PANEL
BUN/Creatinine Ratio: 17 (ref 12–28)
BUN: 17 mg/dL (ref 8–27)
CO2: 25 mmol/L (ref 20–29)
Calcium: 9.4 mg/dL (ref 8.7–10.3)
Chloride: 98 mmol/L (ref 96–106)
Creatinine, Ser: 1.03 mg/dL — ABNORMAL HIGH (ref 0.57–1.00)
Glucose: 131 mg/dL — ABNORMAL HIGH (ref 70–99)
Potassium: 4.4 mmol/L (ref 3.5–5.2)
Sodium: 139 mmol/L (ref 134–144)
eGFR: 59 mL/min/{1.73_m2} — ABNORMAL LOW (ref >59)

## 2022-06-03 LAB — LIPID PANEL
Chol/HDL Ratio: 2.5 ratio (ref 0.0–4.4)
Cholesterol, Total: 149 mg/dL (ref 100–199)
HDL: 60 mg/dL (ref >39)
LDL Chol Calc (NIH): 56 mg/dL (ref 0–99)
Triglycerides: 205 mg/dL — ABNORMAL HIGH (ref 0–149)
VLDL Cholesterol Cal: 33 mg/dL (ref 5–40)

## 2022-06-03 LAB — HEMOGLOBIN A1C
Est. average glucose Bld gHb Est-mCnc: 151 mg/dL
Hgb A1c MFr Bld: 6.9 % — ABNORMAL HIGH (ref 4.8–5.6)

## 2022-06-03 LAB — MICROALBUMIN / CREATININE URINE RATIO
Creatinine, Urine: 74 mg/dL
Microalb/Creat Ratio: 22 mg/g creat (ref 0–29)
Microalbumin, Urine: 16.4 ug/mL

## 2022-06-05 ENCOUNTER — Ambulatory Visit: Payer: Medicare PPO | Attending: Cardiology | Admitting: *Deleted

## 2022-06-05 DIAGNOSIS — Z952 Presence of prosthetic heart valve: Secondary | ICD-10-CM | POA: Diagnosis not present

## 2022-06-05 DIAGNOSIS — Z5181 Encounter for therapeutic drug level monitoring: Secondary | ICD-10-CM | POA: Diagnosis not present

## 2022-06-05 LAB — POCT INR: INR: 1.9 — AB (ref 2.0–3.0)

## 2022-06-05 NOTE — Patient Instructions (Signed)
Continue warfarin 1/2 tablet daily except 1 tablet on Sundays, Tuesdays and Thursdays. Recheck in 4 weeks  Keep leafy veggies consistent.

## 2022-06-07 ENCOUNTER — Ambulatory Visit: Payer: Medicare PPO | Admitting: Family Medicine

## 2022-06-07 VITALS — BP 135/77 | HR 74 | Temp 97.0°F | Ht 67.0 in | Wt 249.0 lb

## 2022-06-07 DIAGNOSIS — I10 Essential (primary) hypertension: Secondary | ICD-10-CM

## 2022-06-07 DIAGNOSIS — E1169 Type 2 diabetes mellitus with other specified complication: Secondary | ICD-10-CM | POA: Diagnosis not present

## 2022-06-07 DIAGNOSIS — E785 Hyperlipidemia, unspecified: Secondary | ICD-10-CM | POA: Diagnosis not present

## 2022-06-07 DIAGNOSIS — E119 Type 2 diabetes mellitus without complications: Secondary | ICD-10-CM

## 2022-06-07 MED ORDER — LANTUS SOLOSTAR 100 UNIT/ML ~~LOC~~ SOPN: 40.0000 [IU] | PEN_INJECTOR | Freq: Every day | SUBCUTANEOUS | 3 refills | Status: DC

## 2022-06-07 MED ORDER — TRULICITY 3 MG/0.5ML ~~LOC~~ SOAJ
3.0000 mg | SUBCUTANEOUS | 2 refills | Status: DC
Start: 2022-06-07 — End: 2022-09-04

## 2022-06-07 MED ORDER — NOVOLOG FLEXPEN 100 UNIT/ML ~~LOC~~ SOPN: 8.0000 [IU] | PEN_INJECTOR | Freq: Three times a day (TID) | SUBCUTANEOUS | 1 refills | Status: DC

## 2022-06-07 MED ORDER — PRAVASTATIN SODIUM 20 MG PO TABS: ORAL_TABLET | ORAL | 1 refills | Status: DC

## 2022-06-07 MED ORDER — METOPROLOL TARTRATE 50 MG PO TABS: ORAL_TABLET | ORAL | 1 refills | Status: DC

## 2022-06-07 MED ORDER — AMLODIPINE BESYLATE 10 MG PO TABS: 10.0000 mg | ORAL_TABLET | Freq: Every day | ORAL | 1 refills | Status: DC

## 2022-06-07 MED ORDER — METFORMIN HCL ER 500 MG PO TB24
500.0000 mg | ORAL_TABLET | Freq: Two times a day (BID) | ORAL | 1 refills | Status: DC
Start: 2022-06-07 — End: 2022-12-24

## 2022-06-07 MED ORDER — LISINOPRIL 40 MG PO TABS: 40.0000 mg | ORAL_TABLET | Freq: Every day | ORAL | 1 refills | Status: DC

## 2022-06-07 MED ORDER — GLUCOSE BLOOD VI STRP
ORAL_STRIP | 5 refills | Status: AC
Start: 2022-06-07 — End: ?

## 2022-06-07 NOTE — Progress Notes (Signed)
Subjective:    Patient ID: [name], female    DOB: [birth year], 67 y.o.   MRN: [id number]  Diabetes She presents for her follow-up diabetic visit. She has type 2 diabetes mellitus. Current diabetic treatments: lantus, novolog, trulicity , metformin.   Refill all medication 90 days supply  The patient's BMI is calculated.  The patient does have obesity.  The patient does try to some degree staying active and watching diet.  It is in the vital signs and acknowledged.  It is above the recommended BMI for the patient's height and weight.  The patient has been counseled regarding healthy diet, restricted portions, avoiding excessive carbohydrates/sugary foods, and increase physical activity as health permits.  It is in the patient's best interest to lower the risk of secondary illness including heart disease strokes and cancer by losing weight.  The patient acknowledges this information.  The patient was seen today as part of a comprehensive diabetic check up. Patient has diabetes Patient relates good compliance with taking the medication. We discussed their diet and exercise activities  We also discussed the importance of notifying us if any excessively high glucoses or low sugars.    Patient for blood pressure check up.  The patient does have hypertension.   Patient relates dietary measures try to minimize salt The importance of healthy diet and activity were discussed Patient relates compliance  Patient here for follow-up regarding cholesterol.    Patient relates taking medication on a regular basis Denies problems with medication Importance of dietary measures discussed Regular lab work regarding lipid and liver was checked and if needing additional labs was appropriately ordered  Review of Systems     Objective:   Physical Exam General-in no acute distress Eyes-no discharge Lungs-respiratory rate normal, CTA CV-no murmurs,RRR Extremities skin warm dry no edema Neuro  grossly normal Behavior normal, alert  Diabetic foot exam normal      Assessment & Plan:  1. Type 2 diabetes mellitus not at goal The patient was seen today as part of a comprehensive visit for diabetes. The importance of keeping her A1c at or below 7 range was discussed.  Discussed diet, activity, and medication compliance Emphasized healthy eating primarily with vegetables fruits and if utilizing meats lean meats such as chicken or fish grilled baked broiled Avoid sugary drinks Minimize and avoid processed foods Fit in regular physical activity preferably 25 to 30 minutes 4 times per week Standard follow-up visit recommended.  Patient aware lack of control and follow-up increases risk of diabetic complications. Regular follow-up visits Yearly ophthalmology Yearly foot exam  - glucose blood test strip; Use as instructed to test blood glucose three time daily  Dispense: 100 each; Refill: 5 - insulin glargine (LANTUS SOLOSTAR) 100 UNIT/ML Solostar Pen; Inject 40 Units into the skin daily.  Dispense: 3 mL; Refill: 3 - insulin aspart (NOVOLOG FLEXPEN) 100 UNIT/ML FlexPen; Inject 8 Units into the skin 3 (three) times daily with meals.  Dispense: 15 mL; Refill: 1 - metFORMIN (GLUCOPHAGE-XR) 500 MG 24 hr tablet; Take 1 tablet (500 mg total) by mouth 2 (two) times daily with a meal.  Dispense: 180 tablet; Refill: 1  2. Hyperlipidemia associated with type 2 diabetes mellitus Hyperlipidemia-importance of diet, weight control, activity, compliance with medications discussed.   Recent labs reviewed.   Any additional labs or refills ordered.   Importance of keeping under good control discussed. Regular follow-up visits discussed   3. Morbid obesity Significant obesity.  Very important to work hard  on healthy diet regular physical activity.  Portion control and minimizing carbohydrates and excessive sweets.  Fitting and regular physical activity would be wise.  HTN- patient seen for follow-up  regarding HTN.   Diet, medication compliance, appropriate labs and refills were completed.   Importance of keeping blood pressure under good control to lessen the risk of complications discussed Regular follow-up visits discussed Labs reviewed with patient overall looks good Follow-up by fall time follow-up sooner problems  Will try to get freestyle libre continuous glucose monitor improved

## 2022-06-15 ENCOUNTER — Ambulatory Visit: Payer: Medicare PPO

## 2022-06-15 ENCOUNTER — Ambulatory Visit (INDEPENDENT_AMBULATORY_CARE_PROVIDER_SITE_OTHER): Payer: Medicare PPO

## 2022-06-15 VITALS — BP 128/70 | Ht 67.0 in | Wt 249.0 lb

## 2022-06-15 DIAGNOSIS — Z1231 Encounter for screening mammogram for malignant neoplasm of breast: Secondary | ICD-10-CM

## 2022-06-15 DIAGNOSIS — Z0000 Encounter for general adult medical examination without abnormal findings: Secondary | ICD-10-CM | POA: Diagnosis not present

## 2022-06-15 NOTE — Patient Instructions (Signed)
[name] , Thank you for taking time to come for your Medicare Wellness Visit. I appreciate your ongoing commitment to your health goals. Please review the following plan we discussed and let me know if I can assist you in the future.   These are the goals we discussed:  Goals      Exercise 3x per week (30 min per time)     Encouraged more movement, chair exercises and [employer].      Medication Management     Patient Goals/Self-Care Activities Patient will:  Take medications as prescribed Check blood sugar continuously with Dexcom G6 Check blood pressure at least once daily, document, and provide at future appointments Engage in dietary modifications by less frequent dining out, decreased fat intake, and fewer sweetened foods & beverages            This is a list of the screening recommended for you and due dates:  Health Maintenance  Topic Date Due   Medicare Annual Wellness Visit  05/24/2022   Complete foot exam   07/26/2022   Flu Shot  10/04/2022   Hemoglobin A1C  12/01/2022   Eye exam for diabetics  05/03/2023   Yearly kidney function blood test for diabetes  05/31/2023   Yearly kidney health urinalysis for diabetes  05/31/2023   Mammogram  08/11/2023   Colon Cancer Screening  01/10/2028   DTaP/Tdap/Td vaccine (2 - Td or Tdap) 03/15/2030   DEXA scan (bone density measurement)  Completed   Hepatitis C Screening: USPSTF Recommendation to screen - Ages 38-79 yo.  Completed   Zoster (Shingles) Vaccine  Completed   HPV Vaccine  Aged Out   Pneumonia Vaccine  Discontinued   COVID-19 Vaccine  Discontinued    Advanced directives: We have a copy of your advanced directives available in your record should your provider ever need to access them.   Conditions/risks identified: Aim for 30 minutes of exercise or brisk walking, 6-8 glasses of water, and 5 servings of fruits and vegetables each day.   Next appointment: Follow up in one year for your annual  wellness visit    Preventive Care 65 Years and Older, Female Preventive care refers to lifestyle choices and visits with your health care provider that can promote health and wellness. What does preventive care include? A yearly physical exam. This is also called an annual well check. Dental exams once or twice a year. Routine eye exams. Ask your health care provider how often you should have your eyes checked. Personal lifestyle choices, including: Daily care of your teeth and gums. Regular physical activity. Eating a healthy diet. Avoiding tobacco and drug use. Limiting alcohol use. Practicing safe sex. Taking low-dose aspirin every day. Taking vitamin and mineral supplements as recommended by your health care provider. What happens during an annual well check? The services and screenings done by your health care provider during your annual well check will depend on your age, overall health, lifestyle risk factors, and family history of disease. Counseling  Your health care provider may ask you questions about your: Alcohol use. Tobacco use. Drug use. Emotional well-being. Home and relationship well-being. Sexual activity. Eating habits. History of falls. Memory and ability to understand (cognition). Work and work Astronomer. Reproductive health. Screening  You may have the following tests or measurements: Height, weight, and BMI. Blood pressure. Lipid and cholesterol levels. These may be checked every 5 years, or more frequently if you are over 11 years old. Skin check. Lung cancer  screening. You may have this screening every year starting at age 92 if you have a 30-pack-year history of smoking and currently smoke or have quit within the past 15 years. Fecal occult blood test (FOBT) of the stool. You may have this test every year starting at age 30. Flexible sigmoidoscopy or colonoscopy. You may have a sigmoidoscopy every 5 years or a colonoscopy every 10 years starting  at age 72. Hepatitis C blood test. Hepatitis B blood test. Sexually transmitted disease (STD) testing. Diabetes screening. This is done by checking your blood sugar (glucose) after you have not eaten for a while (fasting). You may have this done every 1-3 years. Bone density scan. This is done to screen for osteoporosis. You may have this done starting at age 50. Mammogram. This may be done every 1-2 years. Talk to your health care provider about how often you should have regular mammograms. Talk with your health care provider about your test results, treatment options, and if necessary, the need for more tests. Vaccines  Your health care provider may recommend certain vaccines, such as: Influenza vaccine. This is recommended every year. Tetanus, diphtheria, and acellular pertussis (Tdap, Td) vaccine. You may need a Td booster every 10 years. Zoster vaccine. You may need this after age 93. Pneumococcal 13-valent conjugate (PCV13) vaccine. One dose is recommended after age 3. Pneumococcal polysaccharide (PPSV23) vaccine. One dose is recommended after age 89. Talk to your health care provider about which screenings and vaccines you need and how often you need them. This information is not intended to replace advice given to you by your health care provider. Make sure you discuss any questions you have with your health care provider. Document Released: 03/18/2015 Document Revised: 11/09/2015 Document Reviewed: 12/21/2014 Elsevier Interactive Patient Education  2017 [employer].  Fall Prevention in the Home Falls can cause injuries. They can happen to people of all ages. There are many things you can do to make your home safe and to help prevent falls. What can I do on the outside of my home? Regularly fix the edges of walkways and driveways and fix any cracks. Remove anything that might make you trip as you walk through a door, such as a raised step or threshold. Trim any bushes or trees on  the path to your home. Use bright outdoor lighting. Clear any walking paths of anything that might make someone trip, such as rocks or tools. Regularly check to see if handrails are loose or broken. Make sure that both sides of any steps have handrails. Any raised decks and porches should have guardrails on the edges. Have any leaves, snow, or ice cleared regularly. Use sand or salt on walking paths during winter. Clean up any spills in your garage right away. This includes oil or grease spills. What can I do in the bathroom? Use night lights. Install grab bars by the toilet and in the tub and shower. Do not use towel bars as grab bars. Use non-skid mats or decals in the tub or shower. If you need to sit down in the shower, use a plastic, non-slip stool. Keep the floor dry. Clean up any water that spills on the floor as soon as it happens. Remove soap buildup in the tub or shower regularly. Attach bath mats securely with double-sided non-slip rug tape. Do not have throw rugs and other things on the floor that can make you trip. What can I do in the bedroom? Use night lights. Make sure that you  have a light by your bed that is easy to reach. Do not use any sheets or blankets that are too big for your bed. They should not hang down onto the floor. Have a firm chair that has side arms. You can use this for support while you get dressed. Do not have throw rugs and other things on the floor that can make you trip. What can I do in the kitchen? Clean up any spills right away. Avoid walking on wet floors. Keep items that you use a lot in easy-to-reach places. If you need to reach something above you, use a strong step stool that has a grab bar. Keep electrical cords out of the way. Do not use floor polish or wax that makes floors slippery. If you must use wax, use non-skid floor wax. Do not have throw rugs and other things on the floor that can make you trip. What can I do with my stairs? Do  not leave any items on the stairs. Make sure that there are handrails on both sides of the stairs and use them. Fix handrails that are broken or loose. Make sure that handrails are as long as the stairways. Check any carpeting to make sure that it is firmly attached to the stairs. Fix any carpet that is loose or worn. Avoid having throw rugs at the top or bottom of the stairs. If you do have throw rugs, attach them to the floor with carpet tape. Make sure that you have a light switch at the top of the stairs and the bottom of the stairs. If you do not have them, ask someone to add them for you. What else can I do to help prevent falls? Wear shoes that: Do not have high heels. Have rubber bottoms. Are comfortable and fit you well. Are closed at the toe. Do not wear sandals. If you use a stepladder: Make sure that it is fully opened. Do not climb a closed stepladder. Make sure that both sides of the stepladder are locked into place. Ask someone to hold it for you, if possible. Clearly mark and make sure that you can see: Any grab bars or handrails. First and last steps. Where the edge of each step is. Use tools that help you move around (mobility aids) if they are needed. These include: Canes. Walkers. Scooters. Crutches. Turn on the lights when you go into a dark area. Replace any light bulbs as soon as they burn out. Set up your furniture so you have a clear path. Avoid moving your furniture around. If any of your floors are uneven, fix them. If there are any pets around you, be aware of where they are. Review your medicines with your doctor. Some medicines can make you feel dizzy. This can increase your chance of falling. Ask your doctor what other things that you can do to help prevent falls. This information is not intended to replace advice given to you by your health care provider. Make sure you discuss any questions you have with your health care provider. Document Released:  12/16/2008 Document Revised: 07/28/2015 Document Reviewed: 03/26/2014 Elsevier Interactive Patient Education  2017 [employer].

## 2022-06-15 NOTE — Addendum Note (Signed)
Addended by: Elizbeth Squires on: 06/15/2022 09:51 AM   Modules accepted: Orders

## 2022-06-15 NOTE — Progress Notes (Addendum)
Subjective:   [name] is a 67 y.o. female who presents for Medicare Annual (Subsequent) preventive examination.  Review of Systems     Cardiac Risk Factors include: advanced age (>59men, >22 women);diabetes mellitus;dyslipidemia;hypertension;sedentary lifestyle     Objective:    Today's Vitals   06/15/22 1429  BP: 128/70  Weight: 249 lb (112.9 kg)  Height: 5\' 7"  (1.702 m)   Body mass index is 39 kg/m.     06/15/2022    2:45 PM 05/23/2021    3:50 PM 03/15/2020   11:07 AM 01/09/2018    7:02 AM 01/07/2018    1:56 PM 01/04/2016    2:00 AM 12/30/2015   12:48 PM  Advanced Directives  Does Patient Have a Medical Advance Directive? Yes Yes No No No No No  Type of Estate agent of [place];Living will Healthcare Power of [place];Living will       Does patient want to make changes to medical advance directive? No - Patient declined        Copy of Healthcare Power of Attorney in Chart? Yes - validated most recent copy scanned in chart (See row information) Yes - validated most recent copy scanned in chart (See row information)       Would patient like information on creating a medical advance directive?   No - Patient declined No - Patient declined No - Patient declined No - patient declined information No - patient declined information    Current Medications (verified) Outpatient Encounter Medications as of 06/15/2022  Medication Sig   acetaminophen (TYLENOL) 500 MG tablet Take 1,000 mg by mouth every 6 (six) hours as needed for moderate pain or headache.   amLODipine (NORVASC) 10 MG tablet Take 1 tablet (10 mg total) by mouth daily.   aspirin 81 MG tablet Take 81 mg by mouth daily.   Blood Glucose Monitoring Suppl (ACCU-CHEK GUIDE) w/Device KIT Use to check blood glucose three time daily   Continuous Blood Gluc Receiver (FREESTYLE LIBRE READER) DEVI by Does not apply route.   Dulaglutide (TRULICITY) 3 MG/0.5ML SOPN Inject 3 mg as directed once a week.    glucose blood test strip Use as instructed to test blood glucose three time daily   insulin aspart (NOVOLOG FLEXPEN) 100 UNIT/ML FlexPen Inject 8 Units into the skin 3 (three) times daily with meals.   insulin glargine (LANTUS SOLOSTAR) 100 UNIT/ML Solostar Pen Inject 40 Units into the skin daily.   Insulin Pen Needle (SURE COMFORT PEN NEEDLES) 31G X 5 MM MISC USE AS DIRECTED 4 TIMES DAILY.   Lancets (ONETOUCH DELICA PLUS LANCET33G) MISC USE AS DIRECTED.   lisinopril (ZESTRIL) 40 MG tablet Take 1 tablet (40 mg total) by mouth daily.   metFORMIN (GLUCOPHAGE-XR) 500 MG 24 hr tablet Take 1 tablet (500 mg total) by mouth 2 (two) times daily with a meal.   metoprolol tartrate (LOPRESSOR) 50 MG tablet 1 bid   pravastatin (PRAVACHOL) 20 MG tablet TAKE 1 TABLET BY MOUTH AT BEDTIME FOR CHOLESTEROL.   triamcinolone cream (KENALOG) 0.1 % Apply 1 application. topically 2 (two) times daily.   warfarin (COUMADIN) 5 MG tablet TAKE (1/2) TABLET TO (1) TABLET BY MOUTH DAILY OR AS DIRECTED   [DISCONTINUED] Continuous Blood Gluc Sensor (DEXCOM G6 SENSOR) MISC Every to monitor blood glucose. Change every 10 days. (Patient not taking: Reported on 06/07/2022)   [DISCONTINUED] Continuous Blood Gluc Transmit (DEXCOM G6 TRANSMITTER) MISC Every to monitor blood glucose. Change every 90 days. (Patient  not taking: Reported on 06/07/2022)   No facility-administered encounter medications on file as of 06/15/2022.    Allergies (verified) Patient has no known allergies.   History: Past Medical History:  Diagnosis Date   Aortic stenosis    Cataract    Essential hypertension    Heart murmur    Insomnia    Mixed hyperlipidemia    Nocturia    Numbness    Both feet   Restless leg syndrome    Ruptured disk    Type 2 diabetes mellitus    Past Surgical History:  Procedure Laterality Date   AORTIC VALVE REPLACEMENT N/A 01/03/2016   Procedure: AORTIC VALVE REPLACEMENT (AVR);  Surgeon: Alleen Borne, MD;  Location: [facility] OR;   Service: Open Heart Surgery;  Laterality: N/A;   CARDIAC CATHETERIZATION N/A 12/16/2015   Procedure: Right/Left Heart Cath and Coronary Angiography;  Surgeon: Lyn Records, MD;  [facility] INVASIVE CV LAB;  Service: Cardiovascular;  Laterality: N/A;   CARDIAC VALVE REPLACEMENT     COLONOSCOPY  2007   Dr. Jena Gauss: single anal papilla, otherwise unremarkable.    COLONOSCOPY WITH PROPOFOL N/A 01/09/2018   Procedure: COLONOSCOPY WITH PROPOFOL;  Surgeon: Corbin Ade, MD;  Location: [facility] ENDO SUITE;  Service: Endoscopy;  Laterality: N/A;  8:15am   POLYPECTOMY  01/09/2018   Procedure: POLYPECTOMY;  Surgeon: Corbin Ade, MD;  Location: [facility] SU[facility];  Service: Endoscopy;;  rectal polyp   TEE WITHOUT CARDIOVERSION N/A 01/03/2016   Procedure: TRANSESOPHAGEAL ECHOCARDIOGRAM (TEE);  Surgeon: Alleen Borne, MD;  Location[facility];  Service: Open Heart Surgery;  Laterality: N/A;   TOE SURGERY     left foot middle    Family History  Problem Relation Age of Onset   Coronary artery disease Father        Diagnosed in his 78s   Hypertension Father    Diabetes type II Mother    Hypertension Mother    Diabetes Mother    Diabetes type II Brother    Diabetes Brother    Hypertension Sister    Hyperlipidemia Sister    Colon cancer Neg Hx    Social History   Socioeconomic History   Marital status: Widowed    Spouse name: Not on file   Number of children: 3   Years of education: Not on file   Highest education level: Some college, no degree  Occupational History   Occupation: Diplomatic Services operational officer    Comment: Photographer: [employer]  Tobacco Use   Smoking status: Never   Smokeless tobacco: Never  Vaping Use   Vaping Use: Never used  Substance and Sexual Activity   Alcohol use: No   Drug use: No   Sexual activity: Not Currently    Birth control/protection: Abstinence  Other Topics Concern   Not on file  Social History Narrative   2 daughters, 1 son.   4 grandchildren.    Widowed since 2021.   Social Determinants of Health   Financial Resource Strain: Low Risk  (06/15/2022)   Overall Financial Resource Strain (CARDIA)    Difficulty of Paying Living Expenses: Not hard at all  Food Insecurity: No Food Insecurity (06/15/2022)   Hunger Vital Sign    Worried About Running Out of Food in the Last Year: Never true    Ran Out of Food in the Last Year: Never true  Transportation Needs: No Transportation Needs (06/15/2022)   PRAPARE - Transportation  Lack of Transportation (Medical): No    Lack of Transportation (Non-Medical): No  Physical Activity: Insufficiently Active (06/15/2022)   Exercise Vital Sign    Days of Exercise per Week: 1 day    Minutes of Exercise per Session: 30 min  Stress: No Stress Concern Present (06/15/2022)   [employer] of Occupational Health - Occupational Stress Questionnaire    Feeling of Stress : Only a little  Social Connections: Moderately Integrated (06/15/2022)   Social Connection and Isolation Panel [NHANES]    Frequency of Communication with Friends and Family: More than three times a week    Frequency of Social Gatherings with Friends and Family: More than three times a week    Attends Religious Services: More than 4 times per year    Active Member of [employer] or Organizations: Yes    Attends Banker Meetings: More than 4 times per year    Marital Status: Widowed    Tobacco Counseling Counseling given: Not Answered   Clinical Intake:  Pre-visit preparation completed: Yes  Pain : No/denies pain  Diabetes: No  How often do you need to have someone help you when you read instructions, pamphlets, or other written materials from your doctor or pharmacy?: 1 - Never  Diabetic?Yes   Nutrition Risk Assessment:  Has the patient had any N/V/D within the last 2 months?  No  Does the patient have any non-healing wounds?  No  Has the patient had any unintentional weight loss or weight gain?  No    Diabetes:  Is the patient diabetic?  Yes  If diabetic, was a CBG obtained today?  No  Did the patient bring in their glucometer from home?  No  How often do you monitor your CBG's? Libre.   Financial Strains and Diabetes Management:  Are you having any financial strains with the device, your supplies or your medication? No .  Does the patient want to be seen by Chronic Care Management for management of their diabetes?  No  Would the patient like to be referred to a Nutritionist or for Diabetic Management?  No   Diabetic Exams:  Diabetic Eye Exam: Completed 05/03/22 Diabetic Foot Exam: Completed 07/25/21   Interpreter Needed?: No  Information entered by :: Kandis Fantasia LPN   Activities of Daily Living    06/15/2022    2:44 PM 06/11/2022   10:21 AM  In your present state of health, do you have any difficulty performing the following activities:  Hearing? 0 0  Vision? 0 0  Difficulty concentrating or making decisions? 0 0  Walking or climbing stairs? 0 0  Dressing or bathing? 0 0  Doing errands, shopping? 0 0  Preparing Food and eating ? N N  Using the Toilet? N N  In the past six months, have you accidently leaked urine? Y Y  Do you have problems with loss of bowel control? N N  Managing your Medications? N N  Managing your Finances? N N  Housekeeping or managing your Housekeeping? N N    Patient Care Team: Babs Sciara, MD as PCP - General (Family Medicine) Jonelle Sidle, MD as PCP - Cardiology (Cardiology) West Bali, MD (Inactive) as Consulting Physician (Gastroenterology) Gavin Pound, [facility] (Inactive) (Pharmacist)  Indicate any recent Medical Services you may have received from other than [facility] providers in the past year (date may be approximate).     Assessment:   This is a routine wellness examination for Taci.  Hearing/Vision  screen Hearing Screening - Comments:: Denies hearing difficulties   Vision Screening - Comments:: up to  date with routine eye exams with Dr. Earlene Plater   Dietary issues and exercise activities discussed: Current Exercise Habits: Home exercise routine, Type of exercise: walking, Time (Minutes): 30, Frequency (Times/Week): 1, Weekly Exercise (Minutes/Week): 30, Intensity: Mild   Goals Addressed   None   Depression Screen    06/15/2022    2:43 PM 06/07/2022   11:35 AM 03/08/2022   10:12 AM 07/25/2021   10:44 AM 05/23/2021    3:47 PM 09/09/2020    8:30 AM 03/28/2020    9:05 AM  PHQ 2/9 Scores  PHQ - 2 Score 0 0 0 0 0 0 0  PHQ- 9 Score 4 4 4         Fall Risk    06/15/2022    2:44 PM 06/11/2022   10:21 AM 03/08/2022   10:11 AM 07/25/2021   10:43 AM 05/23/2021    3:50 PM  Fall Risk   Falls in the past year? 0 0 0 0 0  Number falls in past yr: 0  0 0 0  Injury with Fall? 0  0 0 0  Risk for fall due to : No Fall Risks   No Fall Risks No Fall Risks  Follow up Falls prevention discussed;Education provided;Falls evaluation completed   Falls evaluation completed Falls prevention discussed    FALL RISK PREVENTION PERTAINING TO THE HOME:  Any stairs in or around the home? No  If so, are there any without handrails? No  Home free of loose throw rugs in walkways, pet beds, electrical cords, etc? Yes  Adequate lighting in your home to reduce risk of falls? Yes   ASSISTIVE DEVICES UTILIZED TO PREVENT FALLS:  Life alert? No  Use of a cane, walker or w/c? No  Grab bars in the bathroom? Yes  Shower chair or bench in shower? No  Elevated toilet seat or a handicapped toilet? Yes   TIMED UP AND GO:  Was the test performed? Yes .  Length of time to ambulate 10 feet: 5 sec.   Gait steady and fast without use of assistive device  Cognitive Function:        06/15/2022    2:45 PM 05/23/2021    3:54 PM  6CIT Screen  What Year? 0 points 0 points  What month? 0 points 0 points  What time? 0 points 0 points  Count back from 20 0 points 0 points  Months in reverse 0 points 0 points  Repeat phrase 0  points 0 points  Total Score 0 points 0 points    Immunizations Immunization History  Administered Date(s) Administered   Fluad Quad(high Dose 65+) 12/08/2019, 12/28/2020, 02/05/2022   Influenza Split 12/18/2012   Influenza,inj,Quad PF,6+ Mos 11/30/2013, 12/30/2014, 01/10/2016, 11/26/2016, 01/07/2018, 12/26/2018   Influenza-Unspecified 02/03/2012, 01/07/2018   Moderna Sars-Covid-2 Vaccination 10/08/2019, 11/09/2019   Pneumococcal Conjugate-13 08/27/2019   Pneumococcal Polysaccharide-23 09/03/2007, 01/10/2016   Tdap 03/15/2020   Zoster Recombinat (Shingrix) 11/23/2019, 05/04/2020    TDAP status: Up to date  Flu Vaccine status: Up to date  Pneumococcal vaccine status: Up to date  Covid-19 vaccine status: Information provided on how to obtain vaccines.   Qualifies for Shingles Vaccine? Yes   Zostavax completed No   Shingrix Completed?: Yes  Screening Tests Health Maintenance  Topic Date Due   FOOT EXAM  07/26/2022   INFLUENZA VACCINE  10/04/2022   HEMOGLOBIN A1C  12/01/2022  OPHTHALMOLOGY EXAM  05/03/2023   Diabetic kidney evaluation - eGFR measurement  05/31/2023   Diabetic kidney evaluation - Urine ACR  05/31/2023   Medicare Annual Wellness (AWV)  06/15/2023   MAMMOGRAM  08/11/2023   COLONOSCOPY (Pts 45-68yrs Insurance coverage will need to be confirmed)  01/10/2028   DTaP/Tdap/Td (2 - Td or Tdap) 03/15/2030   DEXA SCAN  Completed   Hepatitis C Screening  Completed   Zoster Vaccines- Shingrix  Completed   HPV VACCINES  Aged Out   Pneumonia Vaccine 1+ Years old  Discontinued   COVID-19 Vaccine  Discontinued    Health Maintenance  There are no preventive care reminders to display for this patient.   Colorectal cancer screening: Type of screening: Colonoscopy. Completed 01/09/18. Repeat every 10 years  Mammogram status: Ordered today. Pt provided with contact info and advised to call to schedule appt.   Bone Density status: Completed 09/10/19. Results reflect:  Bone density results: OSTEOPENIA. Repeat every 2 years.  Lung Cancer Screening: (Low Dose CT Chest recommended if Age 20-80 years, 30 pack-year currently smoking OR have quit w/in 15years.) does not qualify.   Lung Cancer Screening Referral: n/a  Additional Screening:  Hepatitis C Screening: does qualify; Completed 07/03/17  Vision Screening: Recommended annual ophthalmology exams for early detection of glaucoma and other disorders of the eye. Is the patient up to date with their annual eye exam?  Yes  Who is the provider or what is the name of the office in which the patient attends annual eye exams? Dr. Earlene Plater  If pt is not established with a provider, would they like to be referred to a provider to establish care? No .   Dental Screening: Recommended annual dental exams for proper oral hygiene  Community Resource Referral / Chronic Care Management: CRR required this visit?  No   CCM required this visit?  No      Plan:     I have personally reviewed and noted the following in the patient's chart:   Medical and social history Use of alcohol, tobacco or illicit drugs  Current medications and supplements including opioid prescriptions. Patient is not currently taking opioid prescriptions. Functional ability and status Nutritional status Physical activity Advanced directives List of other physicians Hospitalizations, surgeries, and ER visits in previous 12 months Vitals Screenings to include cognitive, depression, and falls Referrals and appointments  In addition, I have reviewed and discussed with patient certain preventive protocols, quality metrics, and best practice recommendations. A written personalized care plan for preventive services as well as general preventive health recommendations were provided to patient.     Kandis Fantasia [place], [place]   8/37/2902   Nurse Notes: No concerns

## 2022-06-18 ENCOUNTER — Other Ambulatory Visit: Payer: Self-pay

## 2022-06-18 MED ORDER — FREESTYLE LIBRE 3 SENSOR MISC: 2 refills | Status: DC

## 2022-07-03 ENCOUNTER — Ambulatory Visit: Payer: Medicare PPO | Attending: Cardiology | Admitting: *Deleted

## 2022-07-03 DIAGNOSIS — Z952 Presence of prosthetic heart valve: Secondary | ICD-10-CM

## 2022-07-03 DIAGNOSIS — Z5181 Encounter for therapeutic drug level monitoring: Secondary | ICD-10-CM | POA: Diagnosis not present

## 2022-07-03 LAB — POCT INR: INR: 2.5 (ref 2.0–3.0)

## 2022-07-03 NOTE — Patient Instructions (Signed)
Continue warfarin 1/2 tablet daily except 1 tablet on Sundays, Tuesdays and Thursdays. Recheck in 5 weeks  Keep leafy veggies consistent.

## 2022-07-17 ENCOUNTER — Ambulatory Visit: Payer: Medicare PPO | Attending: Nurse Practitioner | Admitting: Nurse Practitioner

## 2022-07-17 ENCOUNTER — Encounter: Payer: Self-pay | Admitting: Nurse Practitioner

## 2022-07-17 VITALS — BP 138/80 | HR 78 | Ht 66.0 in | Wt 262.0 lb

## 2022-07-17 DIAGNOSIS — Z952 Presence of prosthetic heart valve: Secondary | ICD-10-CM | POA: Diagnosis not present

## 2022-07-17 DIAGNOSIS — I10 Essential (primary) hypertension: Secondary | ICD-10-CM | POA: Diagnosis not present

## 2022-07-17 DIAGNOSIS — E782 Mixed hyperlipidemia: Secondary | ICD-10-CM

## 2022-07-17 NOTE — Patient Instructions (Signed)
Medication Instructions:  Your physician recommends that you continue on your current medications as directed. Please refer to the Current Medication list given to you today.  *If you need a refill on your cardiac medications before your next appointment, please call your pharmacy*   Lab Work: NONE   If you have labs (blood work) drawn today and your tests are completely normal, you will receive your results only by: MyChart Message (if you have MyChart) OR A paper copy in the mail If you have any lab test that is abnormal or we need to change your treatment, we will call you to review the results.   Testing/Procedures: NONE    Follow-Up: At [facility], you and your health needs are our priority.  As part of our continuing mission to provide you with exceptional heart care, we have created designated Provider Care Teams.  These Care Teams include your primary Cardiologist (physician) and Advanced Practice Providers (APPs -  Physician Assistants and Nurse Practitioners) who all work together to provide you with the care you need, when you need it.  We recommend signing up for the patient portal called "MyChart".  Sign up information is provided on this After Visit Summary.  MyChart is used to connect with patients for Virtual Visits (Telemedicine).  Patients are able to view lab/test results, encounter notes, upcoming appointments, etc.  Non-urgent messages can be sent to your provider as well.   To learn more about what you can do with MyChart, go to [email or web address].    Your next appointment:   1 year(s)  Provider:   Samuel McDowell, MD    Other Instructions Thank you for choosing [facility]!    

## 2022-07-17 NOTE — Progress Notes (Signed)
Office Visit    Patient Name: [name] Date of Encounter: 07/17/2022  PCP:  Babs Sciara, MD   [facility]  Cardiologist:  Nona Dell, MD  Advanced Practice Provider:  No care team member to display Electrophysiologist:  None   Chief Complaint    [name] is a 67 y.o. female with a hx of aortic valve stenosis, s/p Saint Jude mechanical aortic valve replacement in 2017, mixed hyperlipidemia, hypertension, restless leg syndrome, type 2 diabetes, who presents today for 1 year follow-up.  Past Medical History    Past Medical History:  Diagnosis Date   Aortic stenosis    Cataract    Essential hypertension    Heart murmur    Insomnia    Mixed hyperlipidemia    Nocturia    Numbness    Both feet   Restless leg syndrome    Ruptured disk    Type 2 diabetes mellitus (HCC)    Past Surgical History:  Procedure Laterality Date   AORTIC VALVE REPLACEMENT N/A 01/03/2016   Procedure: AORTIC VALVE REPLACEMENT (AVR);  Surgeon: Alleen Borne, MD;  Location: [facility] OR;  Service: Open Heart Surgery;  Laterality: N/A;   CARDIAC CATHETERIZATION N/A 12/16/2015   Procedure: Right/Left Heart Cath and Coronary Angiography;  Surgeon: Lyn Records, MD;  Location: [facility] INVASIVE CV LAB;  Service: Cardiovascular;  Laterality: N/A;   CARDIAC VALVE REPLACEMENT     COLONOSCOPY  2007   Dr. Jena Gauss: single anal papilla, otherwise unremarkable.    COLONOSCOPY WITH PROPOFOL N/A 01/09/2018   Procedure: COLONOSCOPY WITH PROPOFOL;  Surgeon: Corbin Ade, MD;  Location: [facility] ENDO SUITE;  Service: Endoscopy;  Laterality: N/A;  8:15am   POLYPECTOMY  01/09/2018   Procedure: POLYPECTOMY;  Surgeon: Corbin Ade, MD;  Location: [facility];  Service: Endoscopy;;  rectal polyp   TEE WITHOUT CARDIOVERSION N/A 01/03/2016   Procedure: TRANSESOPHAGEAL ECHOCARDIOGRAM (TEE);  Surgeon: Alleen Borne, MD;  Location: [facility] OR;  Service: Open Heart Surgery;  Laterality: N/A;   TOE  SURGERY     left foot middle     Allergies  No Known Allergies  History of Present Illness    ROAA ATTIG is a 67 y.o. female with a PMH as mentioned above.  Last seen by Dr. Diona Browner on June 23, 2021.  Was overall doing well at this time.  Was tolerating Coumadin well.  Today she presents for 1 year follow-up.  She states she is doing well. Denies any chest pain, shortness of breath, palpitations, syncope, presyncope, dizziness, orthopnea, PND, swelling or significant weight changes, acute bleeding, or claudication.   SH: Officially retired. Enjoys spending time with grandchildren. Enjoys doing bus trips and traveling.   EKGs/Labs/Other Studies Reviewed:   The following studies were reviewed today:   EKG:  EKG is ordered today.  The ekg ordered today demonstrates NSR, 76 bpm, no acute ischemic changes.   Echo 08/03/2021:  1. Left ventricular ejection fraction, by estimation, is 60 to 65%. The  left ventricle has normal function. Left ventricular endocardial border  not optimally defined to evaluate regional wall motion. Left ventricular  diastolic parameters are  indeterminate. The average left ventricular global longitudinal strain is  -19.1 %. The global longitudinal strain is normal.   2. Right ventricular systolic function is normal. The right ventricular  size is normal. There is normal pulmonary artery systolic pressure. The  estimated right ventricular systolic pressure is 11.2 mmHg.  3. The mitral valve is abnormal. Trivial mitral valve regurgitation.   4. The aortic valve has been repaired/replaced. Aortic valve  regurgitation is not visualized. There is a 21 mm St. Jude mechanical  valve present in the aortic position. Aortic valve mean gradient measures  7.0 mmHg.   5. The inferior vena cava is normal in size with greater than 50%  respiratory variability, suggesting right atrial pressure of 3 mmHg.   Comparison(s): Prior images unable to be directly  viewed.  Recent Labs: 05/31/2022: ALT 11; BUN 17; Creatinine, Ser 1.03; Potassium 4.4; Sodium 139  Recent Lipid Panel    Component Value Date/Time   CHOL 149 05/31/2022 0918   TRIG 205 (H) 05/31/2022 0918   HDL 60 05/31/2022 0918   CHOLHDL 2.5 05/31/2022 0918   CHOLHDL 2.5 07/02/2014 0714   VLDL 35 07/02/2014 0714   LDLCALC 56 05/31/2022 0918    Risk Assessment/Calculations:   The 10-year ASCVD risk score (Arnett DK, et al., 2019) is: 19%   Values used to calculate the score:     Age: 30 years     Sex: Female     Is Non-Hispanic African American: No     Diabetic: Yes     Tobacco smoker: No     Systolic Blood Pressure: 138 mmHg     Is BP treated: Yes     HDL Cholesterol: 60 mg/dL     Total Cholesterol: 149 mg/dL   Home Medications   Current Meds  Medication Sig   acetaminophen (TYLENOL) 500 MG tablet Take 1,000 mg by mouth every 6 (six) hours as needed for moderate pain or headache.   amLODipine (NORVASC) 10 MG tablet Take 1 tablet (10 mg total) by mouth daily.   aspirin 81 MG tablet Take 81 mg by mouth daily.   Blood Glucose Monitoring Suppl (ACCU-CHEK GUIDE) w/Device KIT Use to check blood glucose three time daily   Continuous Blood Gluc Receiver (FREESTYLE LIBRE READER) DEVI by Does not apply route.   Continuous Blood Gluc Sensor (FREESTYLE LIBRE 3 SENSOR) MISC Place 1 sensor on the skin every 14 days. Use to check glucose continuously   Dulaglutide (TRULICITY) 3 MG/0.5ML SOPN Inject 3 mg as directed once a week.   glucose blood test strip Use as instructed to test blood glucose three time daily   insulin aspart (NOVOLOG FLEXPEN) 100 UNIT/ML FlexPen Inject 8 Units into the skin 3 (three) times daily with meals.   insulin glargine (LANTUS SOLOSTAR) 100 UNIT/ML Solostar Pen Inject 40 Units into the skin daily.   Insulin Pen Needle (SURE COMFORT PEN NEEDLES) 31G X 5 MM MISC USE AS DIRECTED 4 TIMES DAILY.   Lancets (ONETOUCH DELICA PLUS LANCET33G) MISC USE AS DIRECTED.    lisinopril (ZESTRIL) 40 MG tablet Take 1 tablet (40 mg total) by mouth daily.   metFORMIN (GLUCOPHAGE-XR) 500 MG 24 hr tablet Take 1 tablet (500 mg total) by mouth 2 (two) times daily with a meal.   metoprolol tartrate (LOPRESSOR) 50 MG tablet 1 bid   pravastatin (PRAVACHOL) 20 MG tablet TAKE 1 TABLET BY MOUTH AT BEDTIME FOR CHOLESTEROL.   triamcinolone cream (KENALOG) 0.1 % Apply 1 application. topically 2 (two) times daily.   warfarin (COUMADIN) 5 MG tablet TAKE (1/2) TABLET TO (1) TABLET BY MOUTH DAILY OR AS DIRECTED     Review of Systems    All other systems reviewed and are otherwise negative except as noted above.  Physical Exam    VS:  BP  138/80   Pulse 78   Ht 5\' 6"  (1.676 m)   Wt 262 lb (118.8 kg)   SpO2 98%   BMI 42.29 kg/m  , BMI Body mass index is 42.29 kg/m.  Wt Readings from Last 3 Encounters:  07/17/22 262 lb (118.8 kg)  06/15/22 249 lb (112.9 kg)  06/07/22 249 lb (112.9 kg)     GEN: Morbidly obese, 66 y.o. female in no acute distress. HEENT: normal. Neck: Supple, no JVD, carotid bruits, or masses. Cardiac: S1/S2, RRR, no murmurs, rubs, or gallops. No clubbing, cyanosis, edema.  Radials/PT 2+ and equal bilaterally.  Respiratory:  Respirations regular and unlabored, clear to auscultation bilaterally. MS: No deformity or atrophy. Skin: Warm and dry, no rash. Neuro:  Strength and sensation are intact. Psych: Normal affect.  Assessment & Plan    Hx of aortic valve stenosis, s/p Saint Jude mechanical aortic valve replacement in 2017 Echo 08/2021 revealed 21 mm St. Jude mechanical valve present aortic position with mean gradient measuring at 7.0 mmHg. No medication changes.Continue to follow-up at Coumadin clinic.  Mixed HLD LDL 56 05/2022, elevated triglycerides at 205. Continue pravastatin. Heart healthy diet and regular cardiovascular exercise encouraged.   HTN BP stable. Discussed to monitor BP at home at least 2 hours after medications and sitting for 5-10  minutes. No medication changes at this time.   Morbid obesity BMI 42.29. Weight loss via diet and exercise encouraged. Discussed the impact being overweight would have on cardiovascular risk.  Disposition: Follow up in 1 year(s) with Nona Dell, MD or APP.  Signed, Sharlene Dory, NP 07/17/2022, 9:36 AM [facility]

## 2022-08-07 ENCOUNTER — Ambulatory Visit: Payer: Medicare PPO | Attending: Cardiology | Admitting: *Deleted

## 2022-08-07 DIAGNOSIS — Z952 Presence of prosthetic heart valve: Secondary | ICD-10-CM

## 2022-08-07 DIAGNOSIS — Z5181 Encounter for therapeutic drug level monitoring: Secondary | ICD-10-CM

## 2022-08-07 LAB — POCT INR: INR: 3.8 — AB (ref 2.0–3.0)

## 2022-08-07 NOTE — Patient Instructions (Signed)
Hold warfarin tonight then resume 1/2 tablet daily except 1 tablet on Sundays, Tuesdays and Thursdays. Recheck in 3 weeks  Keep leafy veggies consistent.

## 2022-08-28 ENCOUNTER — Ambulatory Visit: Payer: Medicare PPO | Attending: Cardiology | Admitting: *Deleted

## 2022-08-28 DIAGNOSIS — Z5181 Encounter for therapeutic drug level monitoring: Secondary | ICD-10-CM | POA: Diagnosis not present

## 2022-08-28 DIAGNOSIS — Z952 Presence of prosthetic heart valve: Secondary | ICD-10-CM

## 2022-08-28 LAB — POCT INR: INR: 3.4 — AB (ref 2.0–3.0)

## 2022-08-28 NOTE — Patient Instructions (Signed)
Decrease warfarin to 1/2 tablet daily except 1 tablet on Sundays and Thursdays. Recheck in 3 weeks  Keep leafy veggies consistent.

## 2022-09-03 ENCOUNTER — Ambulatory Visit (HOSPITAL_COMMUNITY)
Admission: RE | Admit: 2022-09-03 | Discharge: 2022-09-03 | Disposition: A | Discharge status: Home / Self Care | Payer: Medicare PPO | Source: Ambulatory Visit | Attending: Family Medicine | Admitting: Family Medicine

## 2022-09-03 ENCOUNTER — Other Ambulatory Visit: Payer: Self-pay | Admitting: Family Medicine

## 2022-09-03 DIAGNOSIS — Z1231 Encounter for screening mammogram for malignant neoplasm of breast: Secondary | ICD-10-CM | POA: Insufficient documentation

## 2022-09-18 ENCOUNTER — Ambulatory Visit: Payer: Medicare PPO | Attending: Cardiology | Admitting: *Deleted

## 2022-09-18 ENCOUNTER — Other Ambulatory Visit: Payer: Self-pay | Admitting: Family Medicine

## 2022-09-18 DIAGNOSIS — Z5181 Encounter for therapeutic drug level monitoring: Secondary | ICD-10-CM | POA: Diagnosis not present

## 2022-09-18 DIAGNOSIS — Z952 Presence of prosthetic heart valve: Secondary | ICD-10-CM

## 2022-09-18 LAB — POCT INR: INR: 2.7 (ref 2.0–3.0)

## 2022-09-18 NOTE — Patient Instructions (Signed)
Continue warfarin 1/2 tablet daily except 1 tablet on Sundays and Thursdays. Recheck in 4 weeks  Keep leafy veggies consistent.

## 2022-10-04 ENCOUNTER — Other Ambulatory Visit: Payer: Self-pay | Admitting: Family Medicine

## 2022-10-16 ENCOUNTER — Other Ambulatory Visit: Payer: Self-pay | Admitting: *Deleted

## 2022-10-16 ENCOUNTER — Ambulatory Visit: Payer: Medicare PPO | Attending: Cardiology | Admitting: *Deleted

## 2022-10-16 DIAGNOSIS — Z5181 Encounter for therapeutic drug level monitoring: Secondary | ICD-10-CM | POA: Diagnosis not present

## 2022-10-16 DIAGNOSIS — E1169 Type 2 diabetes mellitus with other specified complication: Secondary | ICD-10-CM

## 2022-10-16 DIAGNOSIS — Z952 Presence of prosthetic heart valve: Secondary | ICD-10-CM | POA: Diagnosis not present

## 2022-10-16 DIAGNOSIS — E119 Type 2 diabetes mellitus without complications: Secondary | ICD-10-CM

## 2022-10-16 DIAGNOSIS — Z79899 Other long term (current) drug therapy: Secondary | ICD-10-CM

## 2022-10-16 LAB — POCT INR: INR: 2.2 (ref 2.0–3.0)

## 2022-10-16 NOTE — Patient Instructions (Signed)
Continue warfarin 1/2 tablet daily except 1 tablet on Sundays and Thursdays. Recheck in 5 weeks  Keep leafy veggies consistent.

## 2022-10-25 DIAGNOSIS — H35033 Hypertensive retinopathy, bilateral: Secondary | ICD-10-CM | POA: Diagnosis not present

## 2022-10-25 LAB — HM DIABETES EYE EXAM

## 2022-11-01 DIAGNOSIS — E1169 Type 2 diabetes mellitus with other specified complication: Secondary | ICD-10-CM | POA: Diagnosis not present

## 2022-11-01 DIAGNOSIS — E119 Type 2 diabetes mellitus without complications: Secondary | ICD-10-CM | POA: Diagnosis not present

## 2022-11-01 DIAGNOSIS — Z79899 Other long term (current) drug therapy: Secondary | ICD-10-CM | POA: Diagnosis not present

## 2022-11-01 DIAGNOSIS — E785 Hyperlipidemia, unspecified: Secondary | ICD-10-CM | POA: Diagnosis not present

## 2022-11-02 LAB — BASIC METABOLIC PANEL
Calcium: 9.4 mg/dL (ref 8.7–10.3)
Chloride: 102 mmol/L (ref 96–106)

## 2022-11-02 LAB — LIPID PANEL
Cholesterol, Total: 147 mg/dL (ref 100–199)
Triglycerides: 169 mg/dL — ABNORMAL HIGH (ref 0–149)

## 2022-11-08 ENCOUNTER — Other Ambulatory Visit: Payer: Self-pay | Admitting: Family Medicine

## 2022-11-13 ENCOUNTER — Ambulatory Visit: Payer: Medicare PPO | Admitting: Family Medicine

## 2022-11-13 VITALS — BP 118/79 | HR 71 | Temp 97.5°F | Ht 66.0 in | Wt 246.6 lb

## 2022-11-13 DIAGNOSIS — Z23 Encounter for immunization: Secondary | ICD-10-CM | POA: Diagnosis not present

## 2022-11-13 DIAGNOSIS — E785 Hyperlipidemia, unspecified: Secondary | ICD-10-CM

## 2022-11-13 DIAGNOSIS — E1169 Type 2 diabetes mellitus with other specified complication: Secondary | ICD-10-CM

## 2022-11-13 DIAGNOSIS — M85859 Other specified disorders of bone density and structure, unspecified thigh: Secondary | ICD-10-CM | POA: Diagnosis not present

## 2022-11-13 DIAGNOSIS — I10 Essential (primary) hypertension: Secondary | ICD-10-CM

## 2022-11-13 DIAGNOSIS — E1142 Type 2 diabetes mellitus with diabetic polyneuropathy: Secondary | ICD-10-CM

## 2022-11-13 DIAGNOSIS — M545 Low back pain, unspecified: Secondary | ICD-10-CM

## 2022-11-13 NOTE — Progress Notes (Signed)
   Subjective:    Patient ID: [name], female    DOB: 1956-01-05, 67 y.o.   MRN: [id number]  HPI Pt comes in today for 6 month follow up, no questions or concerns today. Pt would like flu shot Type 2 diabetes mellitus not at goal [facility])  Hyperlipidemia associated with type 2 diabetes mellitus (HCC)  Essential hypertension  Osteopenia of hip, unspecified laterality - Plan: DG Bone Density  Immunization due - Plan: Flu Vaccine Trivalent High Dose (Fluad)  Underlying heart conditions stable currently Coumadin use on a regular basis no problems no bleeding issues keeps up on her INR's Patient for blood pressure check up.  The patient does have hypertension.   Patient relates dietary measures try to minimize salt The importance of healthy diet and activity were discussed Patient relates compliance Patient here for follow-up regarding cholesterol.    Patient relates taking medication on a regular basis Denies problems with medication Importance of dietary measures discussed Regular lab work regarding lipid and liver was checked and if needing additional labs was appropriately ordered The patient was seen today as part of a comprehensive diabetic check up. Patient has diabetes Patient relates good compliance with taking the medication. We discussed their diet and exercise activities  We also discussed the importance of notifying us if any excessively high glucoses or low sugars.   The patient's BMI is calculated.  The patient does have obesity.  The patient does try to some degree staying active and watching diet.  It is in the vital signs and acknowledged.  It is above the recommended BMI for the patient's height and weight.  The patient has been counseled regarding healthy diet, restricted portions, avoiding excessive carbohydrates/sugary foods, and increase physical activity as health permits.  It is in the patient's best interest to lower the risk of secondary illness including  heart disease strokes and cancer by losing weight.  The patient acknowledges this information.  Patient with lumbar pain with standing for more than 10 minutes also with walking denies any injury Review of Systems     Objective:   Physical Exam General-in no acute distress Eyes-no discharge Lungs-respiratory rate normal, CTA CV-no murmurs Extremities skin warm dry no edema Neuro grossly normal Behavior normal, alert No edema Diabetic foot exam mild neuropathy      Assessment & Plan:   2. Hyperlipidemia associated with type 2 diabetes mellitus (HCC) Diabetes has been under good control check labs every 6 months.  Recent labs reviewed with patient Cholesterol under good control Continue medications No low sugars   3. Essential hypertension Blood pressure looking good continue current measures.  4. Osteopenia of hip, unspecified laterality Bone density indicated osteopenia in the hip on previous 3 years ago - DG Bone Density; Future  5. Immunization due Flu shot today - Flu Vaccine Trivalent High Dose (Fluad)  6. Lumbar pain We did discuss that we could approach this from the point of view of just monitoring versus going ahead with x-rays physical therapy possible CAT scan Patient defers she would like to stay as is she states if it gets worse she will let us know  7. Type 2 diabetes, controlled, with peripheral neuropathy (HCC) We did discuss prevention of diabetic foot ulcers Flu shot here RSV COVID through pharmacy Follow-up 6 months

## 2022-11-19 ENCOUNTER — Other Ambulatory Visit (HOSPITAL_COMMUNITY): Payer: Medicare PPO

## 2022-11-19 ENCOUNTER — Encounter (HOSPITAL_COMMUNITY): Payer: Self-pay

## 2022-11-22 ENCOUNTER — Ambulatory Visit: Payer: Medicare PPO | Attending: Cardiology

## 2022-11-22 DIAGNOSIS — Z5181 Encounter for therapeutic drug level monitoring: Secondary | ICD-10-CM

## 2022-11-22 DIAGNOSIS — Z952 Presence of prosthetic heart valve: Secondary | ICD-10-CM | POA: Diagnosis not present

## 2022-11-22 LAB — POCT INR: INR: 2.2 (ref 2.0–3.0)

## 2022-11-22 NOTE — Patient Instructions (Signed)
Description   Continue warfarin 1/2 tablet daily except 1 tablet on Sundays and Thursdays. Recheck in 6 weeks  Keep leafy veggies consistent.

## 2022-11-30 ENCOUNTER — Other Ambulatory Visit (HOSPITAL_COMMUNITY): Payer: Medicare PPO

## 2022-12-10 ENCOUNTER — Other Ambulatory Visit: Payer: Self-pay | Admitting: *Deleted

## 2022-12-10 MED ORDER — WARFARIN SODIUM 5 MG PO TABS: ORAL_TABLET | ORAL | 1 refills | Status: DC

## 2022-12-10 NOTE — Telephone Encounter (Signed)
Refill request for warfarin:  Last INR was 2.2 on 11/22/22 Next INR due 01/03/23 LOV was 07/17/22  Refill approved.

## 2022-12-11 ENCOUNTER — Other Ambulatory Visit: Payer: Self-pay

## 2022-12-11 DIAGNOSIS — E119 Type 2 diabetes mellitus without complications: Secondary | ICD-10-CM

## 2022-12-11 MED ORDER — METOPROLOL TARTRATE 50 MG PO TABS: ORAL_TABLET | ORAL | 1 refills | Status: DC

## 2022-12-11 MED ORDER — TRULICITY 3 MG/0.5ML ~~LOC~~ SOAJ: SUBCUTANEOUS | 1 refills | Status: DC

## 2022-12-11 MED ORDER — SURE COMFORT PEN NEEDLES 31G X 5 MM MISC: 5 refills | Status: DC

## 2022-12-11 MED ORDER — LISINOPRIL 40 MG PO TABS: 40.0000 mg | ORAL_TABLET | Freq: Every day | ORAL | 1 refills | Status: DC

## 2022-12-11 MED ORDER — AMLODIPINE BESYLATE 10 MG PO TABS: 10.0000 mg | ORAL_TABLET | Freq: Every day | ORAL | 1 refills | Status: DC

## 2022-12-11 MED ORDER — PRAVASTATIN SODIUM 20 MG PO TABS: ORAL_TABLET | ORAL | 1 refills | Status: DC

## 2022-12-11 MED ORDER — LANTUS SOLOSTAR 100 UNIT/ML ~~LOC~~ SOPN: 40.0000 [IU] | PEN_INJECTOR | Freq: Every day | SUBCUTANEOUS | 3 refills | Status: DC

## 2022-12-24 ENCOUNTER — Ambulatory Visit (HOSPITAL_COMMUNITY)
Admission: RE | Admit: 2022-12-24 | Discharge: 2022-12-24 | Disposition: A | Discharge status: Home / Self Care | Payer: Medicare PPO | Source: Ambulatory Visit | Attending: Family Medicine | Admitting: Family Medicine

## 2022-12-24 ENCOUNTER — Telehealth: Payer: Self-pay | Admitting: Family Medicine

## 2022-12-24 DIAGNOSIS — M85859 Other specified disorders of bone density and structure, unspecified thigh: Secondary | ICD-10-CM | POA: Diagnosis present

## 2022-12-24 DIAGNOSIS — Z794 Long term (current) use of insulin: Secondary | ICD-10-CM | POA: Insufficient documentation

## 2022-12-24 DIAGNOSIS — E119 Type 2 diabetes mellitus without complications: Secondary | ICD-10-CM | POA: Insufficient documentation

## 2022-12-24 DIAGNOSIS — M85852 Other specified disorders of bone density and structure, left thigh: Secondary | ICD-10-CM | POA: Insufficient documentation

## 2022-12-24 DIAGNOSIS — Z780 Asymptomatic menopausal state: Secondary | ICD-10-CM | POA: Diagnosis not present

## 2022-12-24 MED ORDER — METFORMIN HCL ER 500 MG PO TB24: 500.0000 mg | ORAL_TABLET | Freq: Two times a day (BID) | ORAL | 1 refills | Status: DC

## 2022-12-24 NOTE — Telephone Encounter (Signed)
Received via fax Rx request: Prescription sent electronically to pharmacy  

## 2022-12-24 NOTE — Telephone Encounter (Signed)
Refill on metFORMIN (GLUCOPHAGE-XR) 500 MG 24 hr tablet  send to [facility]

## 2022-12-25 ENCOUNTER — Encounter: Payer: Self-pay | Admitting: *Deleted

## 2022-12-26 ENCOUNTER — Telehealth: Payer: Self-pay | Admitting: Family Medicine

## 2022-12-26 NOTE — Telephone Encounter (Signed)
Patient is requesting a muscle relaxer for back pain,It got better but now she is having spasms. [facility]

## 2022-12-27 NOTE — Telephone Encounter (Signed)
Nurses Please talk with Beauty typically muscle relaxers work by working in the brain to cause all muscles to feel relaxed therefore can cause drowsiness and increased risk of falls  Typically we recommend Tylenol for discomfort in warm compresses and massage for tight muscles.  For age 69 and up it is recommended to avoid muscle relaxers.  Physical therapy can be an option as well.  Obviously if her situation is getting worse we would want to see her thank you

## 2022-12-27 NOTE — Telephone Encounter (Signed)
Called and spoke with patient and advised per Dr Lorin Picket Please talk with Clydie Braun typically muscle relaxers work by working in the brain to cause all muscles to feel relaxed therefore can cause drowsiness and increased risk of falls   Typically we recommend Tylenol for discomfort in warm compresses and massage for tight muscles.  For age 69 and up it is recommended to avoid muscle relaxers.  Physical therapy can be an option as well.  Obviously if her situation is getting worse we would want to see her thank you  Patient states that the tylenol does not seem to be helping. Patient also states that she does not feel like she is able to walk to the car to come in for an appointment. She states as long as she is not moving it does not hurt. Patient states she will stick with the Tylenol. Patient informed to call and schedule an appointment if symptoms worsen. Patient verbalized understanding.

## 2023-01-03 ENCOUNTER — Ambulatory Visit: Payer: Medicare PPO | Attending: Cardiology | Admitting: *Deleted

## 2023-01-03 DIAGNOSIS — Z952 Presence of prosthetic heart valve: Secondary | ICD-10-CM | POA: Diagnosis not present

## 2023-01-03 DIAGNOSIS — Z5181 Encounter for therapeutic drug level monitoring: Secondary | ICD-10-CM | POA: Diagnosis not present

## 2023-01-03 LAB — POCT INR: INR: 1.8 — AB (ref 2.0–3.0)

## 2023-01-03 NOTE — Patient Instructions (Signed)
Take warfarin 1 1/2 tablets tonight then resume 1/2 tablet daily except 1 tablet on Sundays and Thursdays. Recheck in 3 weeks  Keep leafy veggies consistent.

## 2023-01-24 ENCOUNTER — Ambulatory Visit: Payer: Medicare PPO | Attending: Cardiology | Admitting: *Deleted

## 2023-01-24 DIAGNOSIS — Z5181 Encounter for therapeutic drug level monitoring: Secondary | ICD-10-CM

## 2023-01-24 DIAGNOSIS — Z952 Presence of prosthetic heart valve: Secondary | ICD-10-CM | POA: Diagnosis not present

## 2023-01-24 LAB — POCT INR: POC INR: 2

## 2023-01-24 NOTE — Patient Instructions (Signed)
Description   Continue taking 1/2 tablet daily except 1 tablet on Sundays and Thursdays. Recheck in 4 weeks. Keep leafy veggies consistent.

## 2023-01-30 ENCOUNTER — Other Ambulatory Visit: Payer: Self-pay | Admitting: Family Medicine

## 2023-02-21 ENCOUNTER — Ambulatory Visit: Payer: Medicare PPO | Attending: Cardiology | Admitting: *Deleted

## 2023-02-21 DIAGNOSIS — Z952 Presence of prosthetic heart valve: Secondary | ICD-10-CM

## 2023-02-21 DIAGNOSIS — Z5181 Encounter for therapeutic drug level monitoring: Secondary | ICD-10-CM | POA: Diagnosis not present

## 2023-02-21 LAB — POCT INR: INR: 1.8 — AB (ref 2.0–3.0)

## 2023-02-21 NOTE — Patient Instructions (Signed)
Take warfarin 1 1/2 tablets tonight then increase dose to 1/2 tablet daily except 1 tablet on Sundays, Tuesdays and Thursdays. Recheck in 4 weeks. Keep leafy veggies consistent.

## 2023-03-15 ENCOUNTER — Other Ambulatory Visit: Payer: Self-pay | Admitting: Family Medicine

## 2023-03-21 ENCOUNTER — Ambulatory Visit: Payer: Medicare PPO | Attending: Cardiology | Admitting: *Deleted

## 2023-03-21 DIAGNOSIS — Z952 Presence of prosthetic heart valve: Secondary | ICD-10-CM

## 2023-03-21 DIAGNOSIS — Z5181 Encounter for therapeutic drug level monitoring: Secondary | ICD-10-CM | POA: Diagnosis not present

## 2023-03-21 LAB — POCT INR: INR: 3 (ref 2.0–3.0)

## 2023-03-21 NOTE — Patient Instructions (Signed)
Continue warfarin 1/2 tablet daily except 1 tablet on Sundays, Tuesdays and Thursdays. Recheck in 4 weeks  Keep leafy veggies consistent.

## 2023-03-29 ENCOUNTER — Other Ambulatory Visit: Payer: Self-pay | Admitting: Family Medicine

## 2023-03-29 DIAGNOSIS — E119 Type 2 diabetes mellitus without complications: Secondary | ICD-10-CM

## 2023-04-18 ENCOUNTER — Ambulatory Visit: Payer: Medicare PPO | Attending: Cardiology | Admitting: *Deleted

## 2023-04-18 DIAGNOSIS — Z5181 Encounter for therapeutic drug level monitoring: Secondary | ICD-10-CM

## 2023-04-18 DIAGNOSIS — Z952 Presence of prosthetic heart valve: Secondary | ICD-10-CM | POA: Diagnosis not present

## 2023-04-18 LAB — POCT INR: INR: 2.3 (ref 2.0–3.0)

## 2023-04-18 NOTE — Patient Instructions (Signed)
Continue warfarin 1/2 tablet daily except 1 tablet on Sundays, Tuesdays and Thursdays. Recheck in 5 weeks  Keep leafy veggies consistent.

## 2023-04-29 DIAGNOSIS — H47093 Other disorders of optic nerve, not elsewhere classified, bilateral: Secondary | ICD-10-CM | POA: Diagnosis not present

## 2023-05-02 ENCOUNTER — Other Ambulatory Visit: Payer: Self-pay | Admitting: Family Medicine

## 2023-05-02 DIAGNOSIS — E119 Type 2 diabetes mellitus without complications: Secondary | ICD-10-CM

## 2023-05-13 ENCOUNTER — Ambulatory Visit: Payer: Medicare PPO | Admitting: Family Medicine

## 2023-05-13 ENCOUNTER — Encounter: Payer: Self-pay | Admitting: Family Medicine

## 2023-05-13 VITALS — BP 124/62 | HR 72 | Temp 97.6°F | Ht 66.0 in | Wt 243.0 lb

## 2023-05-13 DIAGNOSIS — I10 Essential (primary) hypertension: Secondary | ICD-10-CM | POA: Diagnosis not present

## 2023-05-13 DIAGNOSIS — M545 Low back pain, unspecified: Secondary | ICD-10-CM | POA: Diagnosis not present

## 2023-05-13 DIAGNOSIS — E119 Type 2 diabetes mellitus without complications: Secondary | ICD-10-CM

## 2023-05-13 DIAGNOSIS — E1169 Type 2 diabetes mellitus with other specified complication: Secondary | ICD-10-CM

## 2023-05-13 DIAGNOSIS — Z7901 Long term (current) use of anticoagulants: Secondary | ICD-10-CM | POA: Diagnosis not present

## 2023-05-13 DIAGNOSIS — Z79899 Other long term (current) drug therapy: Secondary | ICD-10-CM | POA: Diagnosis not present

## 2023-05-13 DIAGNOSIS — R2689 Other abnormalities of gait and mobility: Secondary | ICD-10-CM

## 2023-05-13 DIAGNOSIS — E785 Hyperlipidemia, unspecified: Secondary | ICD-10-CM | POA: Diagnosis not present

## 2023-05-13 DIAGNOSIS — Z7985 Long-term (current) use of injectable non-insulin antidiabetic drugs: Secondary | ICD-10-CM | POA: Diagnosis not present

## 2023-05-13 MED ORDER — HYDROCODONE-ACETAMINOPHEN 5-325 MG PO TABS: ORAL_TABLET | ORAL | 0 refills | Status: AC

## 2023-05-13 MED ORDER — METOPROLOL TARTRATE 50 MG PO TABS: ORAL_TABLET | ORAL | 1 refills | Status: DC

## 2023-05-13 MED ORDER — LISINOPRIL 40 MG PO TABS: 40.0000 mg | ORAL_TABLET | Freq: Every day | ORAL | 1 refills | Status: DC

## 2023-05-13 MED ORDER — FREESTYLE LIBRE 3 SENSOR MISC: 12 refills | Status: DC

## 2023-05-13 MED ORDER — PRAVASTATIN SODIUM 20 MG PO TABS: ORAL_TABLET | ORAL | 1 refills | Status: DC

## 2023-05-13 MED ORDER — FREESTYLE LIBRE 3 SENSOR MISC: 2 refills | Status: DC

## 2023-05-13 MED ORDER — NOVOLOG FLEXPEN 100 UNIT/ML ~~LOC~~ SOPN: 8.0000 [IU] | PEN_INJECTOR | Freq: Three times a day (TID) | SUBCUTANEOUS | 1 refills | Status: DC

## 2023-05-13 MED ORDER — AMLODIPINE BESYLATE 10 MG PO TABS: 10.0000 mg | ORAL_TABLET | Freq: Every day | ORAL | 1 refills | Status: DC

## 2023-05-13 MED ORDER — METFORMIN HCL ER 500 MG PO TB24: 500.0000 mg | ORAL_TABLET | Freq: Two times a day (BID) | ORAL | 1 refills | Status: DC

## 2023-05-13 MED ORDER — LANTUS SOLOSTAR 100 UNIT/ML ~~LOC~~ SOPN: 40.0000 [IU] | PEN_INJECTOR | Freq: Every day | SUBCUTANEOUS | 3 refills | Status: DC

## 2023-05-13 NOTE — Progress Notes (Signed)
 Subjective:    Patient ID: [name], female    DOB: 09/03/1955, 68 y.o.   MRN: [id number]  Discussed the use of AI scribe software for clinical note transcription with the patient, who gave verbal consent to proceed.  History of Present Illness   The patient presents with concerns about mobility and balance issues.  She experiences mobility and balance issues, feeling slowed down and needing to be cautious while walking due to a fear of falling. She cannot multitask while walking, such as talking on the phone, and is particularly cautious on uneven surfaces and steps, with more difficulty going up than down. She is concerned about the risk of breaking a hip due to her age.  She drives regularly, including trips to [place] with her daughter, and reports no issues with driving. However, she is experiencing some problems with her left eye, including a cataract that is not yet ready for removal. Her eye doctor plans to recheck her condition in three months. She notes a slight change in her right eye, but it was not significant enough to alter her prescription.  She is on Coumadin and gets her levels checked every five weeks, which have been stable. She manages diabetes with Trulicity, a weekly injection, and uses a [employer] 3 for continuous glucose monitoring. However, she needs a new prescription for sensors as her current prescription was unavailable. Her blood sugar is generally well-controlled, with occasional spikes in the evening after a snack.  She experiences muscle spasms in her mid-back, which were particularly severe in October, lasting about two weeks. She finds relief with ibuprofen, which she uses sparingly. She also mentions her left knee feels 'funny' but is not painful, attributing this to her sleeping position. She has been walking on a treadmill daily for five to ten minutes to maintain her physical activity.  She lives with her son and his family, who moved in about a  year after her husband passed away. Her son and grandchildren provide company, and her granddaughter helps with laundry, which is located in the basement, as she has not been downstairs in about six months.         Review of Systems     Objective:    Physical Exam   VITALS: BP- 124/62     General-in no acute distress Eyes-no discharge Lungs-respiratory rate normal, CTA CV-no murmurs,RRR Extremities skin warm dry no edema Neuro grossly normal Behavior normal, alert       Assessment & Plan:  Assessment and Plan    Gait instability Unsteadiness on uneven surfaces and stairs increases fall risk. - Provide leg strengthening exercises three times weekly. - Encourage use of handrails and caution on stairs and uneven surfaces.  Chronic back pain with muscle spasms Intermittent mid-back muscle spasms relieved by ibuprofen. - Prescribe 20 tablets of ibuprofen as needed.  Diabetes Mellitus Well-controlled with Trulicity and [employer]. Occasional evening blood sugar spikes due to snacking. - Send prescription for [employer] 3 sensors to pharmacy. - Continue Trulicity once weekly. - Monitor blood glucose levels regularly.  Cataract Cataract not ready for removal, no significant vision change. - Follow up with eye doctor in three months for reassessment.  General Health Maintenance Maintains health with regular exercise and controlled blood pressure. Compliant with Coumadin regimen. - Encourage continued daily walking, consider increasing to twice daily. - Perform blood work and review results via MyChart. - Update refills on current medications.      The patient was  seen today as part of a comprehensive visit for diabetes. The importance of keeping her A1c at or below 7 range was discussed.  Discussed diet, activity, and medication compliance Emphasized healthy eating primarily with vegetables fruits and if utilizing meats lean meats such as chicken or fish  grilled baked broiled Avoid sugary drinks Minimize and avoid processed foods Fit in regular physical activity preferably 25 to 30 minutes 4 times per week Standard follow-up visit recommended.  Patient aware lack of control and follow-up increases risk of diabetic complications. Regular follow-up visits Yearly ophthalmology Yearly foot exam  HTN- patient seen for follow-up regarding HTN.   Diet, medication compliance, appropriate labs and refills were completed.   Importance of keeping blood pressure under good control to lessen the risk of complications discussed Regular follow-up visits discussed  Hyperlipidemia-importance of diet, weight control, activity, compliance with medications discussed.   Recent labs reviewed.   Any additional labs or refills ordered.   Importance of keeping under good control discussed. Regular follow-up visits discussed  Patient to do lab work await the results Follow-up 6 months

## 2023-05-17 ENCOUNTER — Other Ambulatory Visit: Payer: Self-pay | Admitting: Family Medicine

## 2023-05-17 DIAGNOSIS — E119 Type 2 diabetes mellitus without complications: Secondary | ICD-10-CM

## 2023-05-21 DIAGNOSIS — Z7901 Long term (current) use of anticoagulants: Secondary | ICD-10-CM | POA: Diagnosis not present

## 2023-05-21 DIAGNOSIS — E1169 Type 2 diabetes mellitus with other specified complication: Secondary | ICD-10-CM | POA: Diagnosis not present

## 2023-05-21 DIAGNOSIS — E119 Type 2 diabetes mellitus without complications: Secondary | ICD-10-CM | POA: Diagnosis not present

## 2023-05-21 DIAGNOSIS — Z79899 Other long term (current) drug therapy: Secondary | ICD-10-CM | POA: Diagnosis not present

## 2023-05-21 DIAGNOSIS — E785 Hyperlipidemia, unspecified: Secondary | ICD-10-CM | POA: Diagnosis not present

## 2023-05-23 ENCOUNTER — Encounter: Payer: Self-pay | Admitting: Family Medicine

## 2023-05-23 LAB — CBC
Hematocrit: 37.8 % (ref 34.0–46.6)
Hemoglobin: 12.9 g/dL (ref 11.1–15.9)
MCH: 30.3 pg (ref 26.6–33.0)
MCHC: 34.1 g/dL (ref 31.5–35.7)
MCV: 89 fL (ref 79–97)
Platelets: 376 10*3/uL (ref 150–450)
RBC: 4.26 x10E6/uL (ref 3.77–5.28)
RDW: 12.8 % (ref 11.7–15.4)
WBC: 11.6 10*3/uL — ABNORMAL HIGH (ref 3.4–10.8)

## 2023-05-23 LAB — LIPID PANEL
Chol/HDL Ratio: 2.3 ratio (ref 0.0–4.4)
Cholesterol, Total: 144 mg/dL (ref 100–199)
HDL: 62 mg/dL (ref >39)
LDL Chol Calc (NIH): 54 mg/dL (ref 0–99)
Triglycerides: 167 mg/dL — ABNORMAL HIGH (ref 0–149)
VLDL Cholesterol Cal: 28 mg/dL (ref 5–40)

## 2023-05-23 LAB — HEMOGLOBIN A1C
Est. average glucose Bld gHb Est-mCnc: 137 mg/dL
Hgb A1c MFr Bld: 6.4 % — ABNORMAL HIGH (ref 4.8–5.6)

## 2023-05-23 LAB — BASIC METABOLIC PANEL
BUN/Creatinine Ratio: 22 (ref 12–28)
BUN: 22 mg/dL (ref 8–27)
CO2: 21 mmol/L (ref 20–29)
Calcium: 9.3 mg/dL (ref 8.7–10.3)
Chloride: 99 mmol/L (ref 96–106)
Creatinine, Ser: 1.02 mg/dL — ABNORMAL HIGH (ref 0.57–1.00)
Glucose: 91 mg/dL (ref 70–99)
Potassium: 4.6 mmol/L (ref 3.5–5.2)
Sodium: 137 mmol/L (ref 134–144)
eGFR: 60 mL/min/{1.73_m2} (ref >59)

## 2023-05-23 LAB — HEPATIC FUNCTION PANEL
ALT: 13 IU/L (ref 0–32)
AST: 18 IU/L (ref 0–40)
Albumin: 4.3 g/dL (ref 3.9–4.9)
Alkaline Phosphatase: 99 IU/L (ref 44–121)
Bilirubin Total: 0.4 mg/dL (ref 0.0–1.2)
Bilirubin, Direct: 0.15 mg/dL (ref 0.00–0.40)
Total Protein: 6.8 g/dL (ref 6.0–8.5)

## 2023-05-23 LAB — MICROALBUMIN / CREATININE URINE RATIO: Creatinine, Urine: 23.3 mg/dL

## 2023-05-24 ENCOUNTER — Other Ambulatory Visit: Payer: Self-pay

## 2023-05-28 ENCOUNTER — Ambulatory Visit: Attending: Cardiology | Admitting: *Deleted

## 2023-05-28 DIAGNOSIS — Z5181 Encounter for therapeutic drug level monitoring: Secondary | ICD-10-CM | POA: Diagnosis not present

## 2023-05-28 DIAGNOSIS — Z952 Presence of prosthetic heart valve: Secondary | ICD-10-CM

## 2023-05-28 LAB — POCT INR: INR: 2 (ref 2.0–3.0)

## 2023-05-28 NOTE — Patient Instructions (Signed)
 Continue warfarin 1/2 tablet daily except 1 tablet on Sundays, Tuesdays and Thursdays. Recheck in 6 weeks. Keep leafy veggies consistent.

## 2023-06-21 ENCOUNTER — Ambulatory Visit: Payer: Medicare PPO

## 2023-07-09 ENCOUNTER — Ambulatory Visit: Attending: Cardiology | Admitting: *Deleted

## 2023-07-09 DIAGNOSIS — Z952 Presence of prosthetic heart valve: Secondary | ICD-10-CM

## 2023-07-09 DIAGNOSIS — Z5181 Encounter for therapeutic drug level monitoring: Secondary | ICD-10-CM | POA: Diagnosis not present

## 2023-07-09 LAB — POCT INR: INR: 1.9 — AB (ref 2.0–3.0)

## 2023-07-09 NOTE — Patient Instructions (Signed)
 Increase warfarin to 1 tablet daily except 1/2 tablet on Mondays, Wednesdays and Fridays. Recheck in 4 weeks. Keep leafy veggies consistent.

## 2023-07-15 ENCOUNTER — Other Ambulatory Visit: Payer: Self-pay | Admitting: Family Medicine

## 2023-07-15 DIAGNOSIS — E119 Type 2 diabetes mellitus without complications: Secondary | ICD-10-CM

## 2023-07-18 ENCOUNTER — Other Ambulatory Visit: Payer: Self-pay | Admitting: Cardiology

## 2023-07-18 NOTE — Telephone Encounter (Signed)
 Prescription refill request received for warfarin Lov: 07/17/2022-  Next INR check: 6/3 Warfarin tablet strength: 5mg    Pt needs appointment with cardiologist for future refills.

## 2023-07-22 ENCOUNTER — Other Ambulatory Visit (HOSPITAL_COMMUNITY): Payer: Self-pay | Admitting: Family Medicine

## 2023-07-22 DIAGNOSIS — Z1231 Encounter for screening mammogram for malignant neoplasm of breast: Secondary | ICD-10-CM

## 2023-08-06 ENCOUNTER — Ambulatory Visit: Attending: Cardiology | Admitting: *Deleted

## 2023-08-06 DIAGNOSIS — Z952 Presence of prosthetic heart valve: Secondary | ICD-10-CM

## 2023-08-06 DIAGNOSIS — Z5181 Encounter for therapeutic drug level monitoring: Secondary | ICD-10-CM

## 2023-08-06 LAB — POCT INR: INR: 3.4 — AB (ref 2.0–3.0)

## 2023-08-06 NOTE — Patient Instructions (Signed)
 Pt has been taking an extra 1/2 tablet per week by mistake Hold warfarin tonight then start 1 tablet daily except 1/2 tablet on Mondays, Wednesdays and Fridays. Recheck in 3 weeks. Keep leafy veggies consistent.

## 2023-08-28 ENCOUNTER — Telehealth: Payer: Self-pay

## 2023-08-28 ENCOUNTER — Ambulatory Visit: Attending: Cardiology | Admitting: *Deleted

## 2023-08-28 DIAGNOSIS — Z5181 Encounter for therapeutic drug level monitoring: Secondary | ICD-10-CM | POA: Diagnosis not present

## 2023-08-28 DIAGNOSIS — Z952 Presence of prosthetic heart valve: Secondary | ICD-10-CM | POA: Diagnosis not present

## 2023-08-28 LAB — POCT INR: INR: 2.4 (ref 2.0–3.0)

## 2023-08-28 NOTE — Telephone Encounter (Signed)
 Patient dropped off document DMV, to be filled out by provider. Patient requested to send it back via Call Patient to pick up within 7-days. Document is located in providers tray at front office.Please advise at Mobile [phone] (mobile)

## 2023-08-28 NOTE — Patient Instructions (Signed)
 Continue warfarin 1 tablet daily except 1/2 tablet on Mondays, Wednesdays and Fridays. Recheck in 4 weeks. Keep leafy veggies consistent.

## 2023-08-28 NOTE — Telephone Encounter (Signed)
 This was completed please forward to patient thank you

## 2023-08-28 NOTE — Progress Notes (Signed)
Please see anticoagulation encounter.

## 2023-09-05 ENCOUNTER — Ambulatory Visit (HOSPITAL_COMMUNITY)
Admission: RE | Admit: 2023-09-05 | Discharge: 2023-09-05 | Disposition: A | Discharge status: Home / Self Care | Source: Ambulatory Visit | Attending: Family Medicine | Admitting: Family Medicine

## 2023-09-05 DIAGNOSIS — Z1231 Encounter for screening mammogram for malignant neoplasm of breast: Secondary | ICD-10-CM | POA: Diagnosis not present

## 2023-09-15 ENCOUNTER — Other Ambulatory Visit: Payer: Self-pay | Admitting: Family Medicine

## 2023-09-15 DIAGNOSIS — E119 Type 2 diabetes mellitus without complications: Secondary | ICD-10-CM

## 2023-09-16 ENCOUNTER — Other Ambulatory Visit: Payer: Self-pay

## 2023-09-16 DIAGNOSIS — E119 Type 2 diabetes mellitus without complications: Secondary | ICD-10-CM

## 2023-09-16 MED ORDER — TRULICITY 3 MG/0.5ML ~~LOC~~ SOAJ: SUBCUTANEOUS | 1 refills | Status: DC

## 2023-09-25 ENCOUNTER — Ambulatory Visit: Attending: Cardiology | Admitting: Cardiology

## 2023-09-25 ENCOUNTER — Ambulatory Visit (INDEPENDENT_AMBULATORY_CARE_PROVIDER_SITE_OTHER): Admitting: *Deleted

## 2023-09-25 VITALS — BP 138/66 | HR 76 | Ht 65.0 in | Wt 239.8 lb

## 2023-09-25 DIAGNOSIS — I10 Essential (primary) hypertension: Secondary | ICD-10-CM | POA: Diagnosis not present

## 2023-09-25 DIAGNOSIS — Z952 Presence of prosthetic heart valve: Secondary | ICD-10-CM | POA: Diagnosis not present

## 2023-09-25 DIAGNOSIS — E782 Mixed hyperlipidemia: Secondary | ICD-10-CM | POA: Diagnosis not present

## 2023-09-25 DIAGNOSIS — Z5181 Encounter for therapeutic drug level monitoring: Secondary | ICD-10-CM | POA: Diagnosis not present

## 2023-09-25 LAB — POCT INR: INR: 2.3 (ref 2.0–3.0)

## 2023-09-25 NOTE — Progress Notes (Signed)
 Please see anticoagulation encounter 2.3

## 2023-09-25 NOTE — Patient Instructions (Signed)
 Continue warfarin 1 tablet daily except 1/2 tablet on Mondays, Wednesdays and Fridays. Recheck in 5 weeks. Keep leafy veggies consistent.

## 2023-09-25 NOTE — Progress Notes (Signed)
 Cardiology Office Note  Date: 09/25/2023   ID: [name], DOB 25-Apr-1955, MRN [id number]  History of Present Illness: [name] is a 68 y.o. female last seen in May 2024 by Ms. Miriam NP, I reviewed her note.  She is here for a routine visit.  She does not report any exertional chest pain or breathlessness beyond NYHA class II with typical activities.  No palpitations.  She continues to follow with Dr. Alphonsa.  She remains on Coumadin  with follow-up in the anticoagulation clinic.  No spontaneous bleeding problems reported.  We went over the remainder of her medications.  Her LDL was 54 in March on Pravachol  20 mg daily.  I reviewed her ECG today which shows sinus rhythm.  Echocardiogram from 2023 is noted below.  Physical Exam: VS:  BP 138/66   Pulse 76   Ht 5' 5 (1.651 m)   Wt 239 lb 12.8 oz (108.8 kg)   SpO2 98%   BMI 39.90 kg/m , BMI Body mass index is 39.9 kg/m.  Wt Readings from Last 3 Encounters:  09/25/23 239 lb 12.8 oz (108.8 kg)  05/13/23 243 lb (110.2 kg)  11/13/22 246 lb 9.6 oz (111.9 kg)    General: Patient appears comfortable at rest. HEENT: Conjunctiva and lids normal. Neck: Supple, no elevated JVP or carotid bruits. Lungs: Clear to auscultation, nonlabored breathing at rest. Cardiac: Regular rate and rhythm, no S3, 1/6 systolic murmur with prosthetic sound in S2. Extremities: No pitting edema.  ECG:  An ECG dated 07/17/2022 was personally reviewed today and demonstrated:  Sinus rhythm.  Labwork: 05/21/2023: ALT 13; AST 18; BUN 22; Creatinine, Ser 1.02; Hemoglobin 12.9; Platelets 376; Potassium 4.6; Sodium 137     Component Value Date/Time   CHOL 144 05/21/2023 0959   TRIG 167 (H) 05/21/2023 0959   HDL 62 05/21/2023 0959   CHOLHDL 2.3 05/21/2023 0959   CHOLHDL 2.5 07/02/2014 0714   VLDL 35 07/02/2014 0714   LDLCALC 54 05/21/2023 0959   Other Studies Reviewed Today:  Echocardiogram 08/03/2021:  1. Left ventricular ejection fraction, by  estimation, is 60 to 65%. The  left ventricle has normal function. Left ventricular endocardial border  not optimally defined to evaluate regional wall motion. Left ventricular  diastolic parameters are  indeterminate. The average left ventricular global longitudinal strain is  -19.1 %. The global longitudinal strain is normal.   2. Right ventricular systolic function is normal. The right ventricular  size is normal. There is normal pulmonary artery systolic pressure. The  estimated right ventricular systolic pressure is 11.2 mmHg.   3. The mitral valve is abnormal. Trivial mitral valve regurgitation.   4. The aortic valve has been repaired/replaced. Aortic valve  regurgitation is not visualized. There is a 21 mm St. Jude mechanical  valve present in the aortic position. Aortic valve mean gradient measures  7.0 mmHg.   5. The inferior vena cava is normal in size with greater than 50%  respiratory variability, suggesting right atrial pressure of 3 mmHg.   Assessment and Plan:  1.  History of aortic stenosis status post 21 mm St. Jude [employer] mechanical AVR in 2017.  She continues on Coumadin  with follow-up in the anticoagulation clinic.  Echocardiogram in June 2023 reveals normal prosthetic function with a mean AV gradient 7 mmHg and no aortic regurgitation.  She remains symptomatically stable, no significant change in murmur.  Continue observation.   2.  Mixed hyperlipidemia, LDL 54 in March.  Continue Pravachol   20 mg daily.  3.  Primary hypertension.  No changes were made today.  Continue Norvasc  10 mg daily, Lopressor  50 mg twice daily, and lisinopril  40 mg daily.  Disposition:  Follow up 1 year.  Signed, Jayson JUDITHANN Sierras, M.D., F.A.C.C. [facility]

## 2023-09-25 NOTE — Patient Instructions (Addendum)

## 2023-09-27 ENCOUNTER — Telehealth: Payer: Self-pay

## 2023-09-27 ENCOUNTER — Ambulatory Visit

## 2023-09-27 VITALS — Ht 65.0 in | Wt 239.0 lb

## 2023-09-27 DIAGNOSIS — Z0000 Encounter for general adult medical examination without abnormal findings: Secondary | ICD-10-CM

## 2023-09-27 NOTE — Telephone Encounter (Signed)
 Please order lipid, liver, metabolic 7, A1c, CBC Diabetes, hyperlipidemia, high risk med, leukocytosis  Please inform patient she can do these labs several days before her follow-up visit in September

## 2023-09-27 NOTE — Patient Instructions (Signed)
 Ms. Jeffrey , Thank you for taking time out of your busy schedule to complete your Annual Wellness Visit with me. I enjoyed our conversation and look forward to speaking with you again next year. I, as well as your care team,  appreciate your ongoing commitment to your health goals. Please review the following plan we discussed and let me know if I can assist you in the future. Your Game plan/ To Do List    Follow up Visits: Next Medicare AWV with our clinical staff: In 1 year    Have you seen your provider in the last 6 months (3 months if uncontrolled diabetes)? Yes Next Office Visit with your provider: 11/13/23  Clinician Recommendations:  Aim for 30 minutes of exercise or brisk walking, 6-8 glasses of water , and 5 servings of fruits and vegetables each day.       This is a list of the screening recommended for you and due dates:  Health Maintenance  Topic Date Due   Flu Shot  10/04/2023   Eye exam for diabetics  10/25/2023   Complete foot exam   11/13/2023   Hemoglobin A1C  11/21/2023   Yearly kidney function blood test for diabetes  05/20/2024   Yearly kidney health urinalysis for diabetes  05/20/2024   Medicare Annual Wellness Visit  09/26/2024   Mammogram  09/04/2025   Colon Cancer Screening  01/10/2028   DTaP/Tdap/Td vaccine (2 - Td or Tdap) 03/15/2030   DEXA scan (bone density measurement)  Completed   Hepatitis C Screening  Completed   Zoster (Shingles) Vaccine  Completed   Hepatitis B Vaccine  Aged Out   HPV Vaccine  Aged Out   Meningitis B Vaccine  Aged Out   Pneumococcal Vaccine for age over 21  Discontinued   COVID-19 Vaccine  Discontinued    Advanced directives: (In Chart) A copy of your advanced directives are scanned into your chart should your provider ever need it.  Advance Care Planning is important because it:  [x]  Makes sure you receive the medical care that is consistent with your values, goals, and preferences  [x]  It provides guidance to your family and  loved ones and reduces their decisional burden about whether or not they are making the right decisions based on your wishes.  Follow the link provided in your after visit summary or read over the paperwork we have mailed to you to help you started getting your Advance Directives in place. If you need assistance in completing these, please reach out to us  so that we can help you!  See attachments for Preventive Care and Fall Prevention Tips.

## 2023-09-27 NOTE — Telephone Encounter (Signed)
 Patient seen for AWV and is asking if labs can be ordered before next appointment. (11/13/23)

## 2023-09-27 NOTE — Progress Notes (Signed)
 Subjective:   [name] is a 68 y.o. who presents for a Medicare Wellness preventive visit.  As a reminder, Annual Wellness Visits don't include a physical exam, and some assessments may be limited, especially if this visit is performed virtually. We may recommend an in-person follow-up visit with your provider if needed.  Visit Complete: Virtual I connected with  [name] on 09/27/23 by a audio enabled telemedicine application and verified that I am speaking with the correct person using two identifiers.  Patient Location: Home  Provider Location: Home Office  I discussed the limitations of evaluation and management by telemedicine. The patient expressed understanding and agreed to proceed.  Vital Signs: Because this visit was a virtual/telehealth visit, some criteria may be missing or patient reported. Any vitals not documented were not able to be obtained and vitals that have been documented are patient reported.  VideoDeclined- This patient declined Librarian, academic. Therefore the visit was completed with audio only.  Persons Participating in Visit: Patient.  AWV Questionnaire: Yes: Patient Medicare AWV questionnaire was completed by the patient on 09/23/23; I have confirmed that all information answered by patient is correct and no changes since this date.  Cardiac Risk Factors include: advanced age (>93men, >66 women);dyslipidemia;diabetes mellitus;hypertension;sedentary lifestyle     Objective:    Today's Vitals   09/27/23 1006  Weight: 239 lb (108.4 kg)  Height: 5' 5 (1.651 m)   Body mass index is 39.77 kg/m.     09/27/2023   10:09 AM 06/15/2022    2:45 PM 05/23/2021    3:50 PM 03/15/2020   11:07 AM 01/09/2018    7:02 AM 01/07/2018    1:56 PM 01/04/2016    2:00 AM  Advanced Directives  Does Patient Have a Medical Advance Directive? Yes Yes Yes No No  No  No   Type of Estate agent of [place];Living will  Healthcare Power of [place];Living will Healthcare Power of [place];Living will      Does patient want to make changes to medical advance directive? No - Patient declined No - Patient declined       Copy of Healthcare Power of Attorney in Chart? Yes - validated most recent copy scanned in chart (See row information) Yes - validated most recent copy scanned in chart (See row information) Yes - validated most recent copy scanned in chart (See row information)      Would patient like information on creating a medical advance directive?    No - Patient declined No - Patient declined  No - Patient declined  No - patient declined information      Data saved with a previous flowsheet row definition    Current Medications (verified) Outpatient Encounter Medications as of 09/27/2023  Medication Sig   acetaminophen  (TYLENOL ) 500 MG tablet Take 1,000 mg by mouth every 6 (six) hours as needed for moderate pain or headache.   amLODipine  (NORVASC ) 10 MG tablet Take 1 tablet (10 mg total) by mouth daily.   aspirin  81 MG tablet Take 81 mg by mouth daily.   Blood Glucose Monitoring Suppl (ACCU-CHEK GUIDE) w/Device KIT Use to check blood glucose three time daily   Continuous Blood Gluc Receiver (FREESTYLE LIBRE READER) DEVI by Does not apply route.   Continuous Glucose Sensor (FREESTYLE LIBRE 3 SENSOR) MISC PLACE 1 SENSOR ON THE SKIN EVERY 14 DAYS TO CHECK GLUCOSE CONTINUOUSLY   Dulaglutide  (TRULICITY ) 3 MG/0.5ML SOAJ INJECT 3MG  INTO THE SKIN ONCE WEEK.  glucose blood test strip Use as instructed to test blood glucose three time daily   HYDROcodone -acetaminophen  (NORCO/VICODIN) 5-325 MG tablet 1 every 6 hours as needed back pain   insulin  aspart (NOVOLOG  FLEXPEN) 100 UNIT/ML FlexPen Inject 8 Units into the skin 3 (three) times daily with meals.   insulin  glargine (LANTUS  SOLOSTAR) 100 UNIT/ML Solostar Pen Inject 40 Units into the skin daily.   Insulin  Pen Needle (SURE COMFORT PEN NEEDLES) 31G X 5 MM MISC USE  AS DIRECTED 4 TIMES DAILY.   Lancets (ONETOUCH DELICA PLUS LANCET33G) MISC USE AS DIRECTED.   lisinopril  (ZESTRIL ) 40 MG tablet Take 1 tablet (40 mg total) by mouth daily.   metFORMIN  (GLUCOPHAGE -XR) 500 MG 24 hr tablet Take 1 tablet (500 mg total) by mouth 2 (two) times daily with a meal.   metoprolol  tartrate (LOPRESSOR ) 50 MG tablet 1 bid   pravastatin  (PRAVACHOL ) 20 MG tablet TAKE 1 TABLET BY MOUTH AT BEDTIME FOR CHOLESTEROL.   triamcinolone  cream (KENALOG ) 0.1 % Apply 1 application. topically 2 (two) times daily.   warfarin (COUMADIN ) 5 MG tablet TAKE (1/2) TABLET TO (1) TABLET BY MOUTH DAILY OR AS DIRECTED   No facility-administered encounter medications on file as of 09/27/2023.    Allergies (verified) Patient has no known allergies.   History: Past Medical History:  Diagnosis Date   Aortic stenosis    Cataract    Essential hypertension    Heart murmur    Insomnia    Mixed hyperlipidemia    Nocturia    Numbness    Both feet   Restless leg syndrome    Ruptured disk    Type 2 diabetes mellitus (HCC)    Past Surgical History:  Procedure Laterality Date   AORTIC VALVE REPLACEMENT N/A 01/03/2016   Procedure: AORTIC VALVE REPLACEMENT (AVR);  Surgeon: Dorise MARLA Fellers, MD;  Location: [facility] OR;  Service: Open Heart Surgery;  Laterality: N/A;   CARDIAC CATHETERIZATION N/A 12/16/2015   Procedure: Right/Left Heart Cath and Coronary Angiography;  Surgeon: Victory LELON Sharps, MD;  Location[facility] INVAS[facility] CV LAB;  Service: Cardiovascular;  Laterality: N/A;   CARDIAC VALVE REPLACEMENT     COLONOSCOPY  2007   Dr. Shaaron: single anal papilla, otherwise unremarkable.    COLONOSCOPY WITH PROPOFOL  N/A 01/09/2018   Procedure: COLONOSCOPY WITH PROPOFOL ;  Surgeon: Shaaron Lamar HERO, MD;  Location[facility];  Service: Endoscopy;  Laterality: N/A;  8:15am   POLYPECTOMY  01/09/2018   Procedure: POLYPECTOMY;  Surgeon: Shaaron Lamar HERO, MD;  Location: [facility];  Service: Endoscopy;;  rectal polyp    TEE WITHOUT CARDIOVERSION N/A 01/03/2016   Procedure: TRANSESOPHAGEAL ECHOCARDIOGRAM (TEE);  Surgeon: Dorise MARLA Fellers, MD;  Location: [facility] OR;  Service: Open Heart Surgery;  Laterality: N/A;   TOE SURGERY     left foot middle    Family History  Problem Relation Age of Onset   Coronary artery disease Father        Diagnosed in his 73s   Hypertension Father    Diabetes type II Mother    Hypertension Mother    Diabetes Mother    Cancer Mother    Diabetes type II Brother    Diabetes Brother    Hypertension Sister    Hyperlipidemia Sister    Colon cancer Neg Hx    Social History   Socioeconomic History   Marital status: Widowed    Spouse name: Not on file   Number of children: 3   Years of  education: Not on file   Highest education level: 12th grade  Occupational History   Occupation: Diplomatic Services operational officer    Comment: Photographer: [employer]  Tobacco Use   Smoking status: Never   Smokeless tobacco: Never  Vaping Use   Vaping status: Never Used  Substance and Sexual Activity   Alcohol use: No   Drug use: No   Sexual activity: Not Currently    Birth control/protection: Abstinence  Other Topics Concern   Not on file  Social History Narrative   2 daughters, 1 son.   4 grandchildren.   Widowed since 2021.   Social Drivers of Corporate investment banker Strain: Low Risk  (09/23/2023)   Overall Financial Resource Strain (CARDIA)    Difficulty of Paying Living Expenses: Not hard at all  Food Insecurity: No Food Insecurity (09/23/2023)   Hunger Vital Sign    Worried About Running Out of Food in the Last Year: Never true    Ran Out of Food in the Last Year: Never true  Transportation Needs: No Transportation Needs (09/23/2023)   PRAPARE - Administrator, Civil Service (Medical): No    Lack of Transportation (Non-Medical): No  Physical Activity: Insufficiently Active (09/23/2023)   Exercise Vital Sign    Days of Exercise per Week: 2 days    Minutes of  Exercise per Session: 10 min  Stress: No Stress Concern Present (09/23/2023)   [employer] of Occupational Health - Occupational Stress Questionnaire    Feeling of Stress: Not at all  Social Connections: Moderately Integrated (09/23/2023)   Social Connection and Isolation Panel    Frequency of Communication with Friends and Family: More than three times a week    Frequency of Social Gatherings with Friends and Family: More than three times a week    Attends Religious Services: More than 4 times per year    Active Member of [employer] or Organizations: Yes    Attends Banker Meetings: More than 4 times per year    Marital Status: Widowed    Tobacco Counseling Counseling given: Not Answered    Clinical Intake:  Pre-visit preparation completed: Yes  Pain : No/denies pain  Diabetes: Yes CBG done?: No Did pt. bring in CBG monitor from home?: No  Lab Results  Component Value Date   HGBA1C 6.4 (H) 05/21/2023   HGBA1C 6.6 (H) 11/01/2022   HGBA1C 6.9 (H) 05/31/2022     How often do you need to have someone help you when you read instructions, pamphlets, or other written materials from your doctor or pharmacy?: 1 - Never  Interpreter Needed?: No  Information entered by :: Charmaine Bloodgood LPN   Activities of Daily Living     09/23/2023    8:58 AM  In your present state of health, do you have any difficulty performing the following activities:  Hearing? 0  Vision? 0  Difficulty concentrating or making decisions? 0  Walking or climbing stairs? 1  Dressing or bathing? 0  Doing errands, shopping? 0  Preparing Food and eating ? N  Using the Toilet? N  In the past six months, have you accidently leaked urine? Y  Do you have problems with loss of bowel control? N  Managing your Medications? N  Managing your Finances? N  Housekeeping or managing your Housekeeping? N    Patient Care Team: Alphonsa Glendia LABOR, MD as PCP - General (Family Medicine) Debera Jayson MATSU,  MD as PCP -  Cardiology (Cardiology) Dr Willma [facility], OD  I have updated your Care Teams any recent Medical Services you may have received from other providers in the past year.     Assessment:   This is a routine wellness examination for Liba.  Hearing/Vision screen Hearing Screening - Comments:: Denies hearing difficulties   Vision Screening - Comments:: Wears rx glasses - up to date with routine eye exams with Dr. Willma Moats    Goals Addressed             This Visit's Progress    Maintain health and independence   On track      Depression Screen     09/27/2023   10:08 AM 05/13/2023    1:54 PM 11/13/2022   10:44 AM 06/15/2022    2:43 PM 06/07/2022   11:35 AM 03/08/2022   10:12 AM 07/25/2021   10:44 AM  PHQ 2/9 Scores  PHQ - 2 Score 0 0 0 0 0 0 0  PHQ- 9 Score  0 4 4 4 4      Fall Risk     09/23/2023    8:58 AM 05/13/2023    1:54 PM 11/13/2022   10:44 AM 06/15/2022    2:44 PM 06/11/2022   10:21 AM  Fall Risk   Falls in the past year? 0 0 0 0 0  Number falls in past yr: 0   0   Injury with Fall? 0   0   Risk for fall due to : No Fall Risks   No Fall Risks   Follow up Education provided;Falls prevention discussed;Falls evaluation completed   Falls prevention discussed;Education provided;Falls evaluation completed     MEDICARE RISK AT HOME:  Medicare Risk at Home Any stairs in or around the home?: (Patient-Rptd) Yes If so, are there any without handrails?: (Patient-Rptd) No Home free of loose throw rugs in walkways, pet beds, electrical cords, etc?: (Patient-Rptd) Yes Adequate lighting in your home to reduce risk of falls?: (Patient-Rptd) Yes Life alert?: (Patient-Rptd) No Use of a cane, walker or w/c?: (Patient-Rptd) No Grab bars in the bathroom?: (Patient-Rptd) Yes Shower chair or bench in shower?: (Patient-Rptd) Yes Elevated toilet seat or a handicapped toilet?: (Patient-Rptd) Yes  TIMED UP AND GO:  Was the test performed?  No  Cognitive  Function: Declined/Normal: No cognitive concerns noted by patient or family. Patient alert, oriented, able to answer questions appropriately and recall recent events. No signs of memory loss or confusion.        06/15/2022    2:45 PM 05/23/2021    3:54 PM  6CIT Screen  What Year? 0 points 0 points  What month? 0 points 0 points  What time? 0 points 0 points  Count back from 20 0 points 0 points  Months in reverse 0 points 0 points  Repeat phrase 0 points 0 points  Total Score 0 points 0 points    Immunizations Immunization History  Administered Date(s) Administered   Fluad Quad(high Dose 65+) 12/08/2019, 12/28/2020, 02/05/2022   Fluad Trivalent(High Dose 65+) 11/13/2022   Influenza Split 12/18/2012   Influenza,inj,Quad PF,6+ Mos 11/30/2013, 12/30/2014, 01/10/2016, 11/26/2016, 01/07/2018, 12/26/2018   Influenza-Unspecified 02/03/2012, 01/07/2018   Moderna Sars-Covid-2 Vaccination 10/08/2019, 11/09/2019   Pneumococcal Conjugate-13 08/27/2019   Pneumococcal Polysaccharide-23 09/03/2007, 01/10/2016   Tdap 03/15/2020   Zoster Recombinant(Shingrix ) 11/23/2019, 05/04/2020    Screening Tests Health Maintenance  Topic Date Due   INFLUENZA VACCINE  10/04/2023   OPHTHALMOLOGY EXAM  10/25/2023  FOOT EXAM  11/13/2023   HEMOGLOBIN A1C  11/21/2023   Diabetic kidney evaluation - eGFR measurement  05/20/2024   Diabetic kidney evaluation - Urine ACR  05/20/2024   Medicare Annual Wellness (AWV)  09/26/2024   MAMMOGRAM  09/04/2025   Colonoscopy  01/10/2028   DTaP/Tdap/Td (2 - Td or Tdap) 03/15/2030   DEXA SCAN  Completed   Hepatitis C Screening  Completed   Zoster Vaccines- Shingrix   Completed   Hepatitis B Vaccines  Aged Out   HPV VACCINES  Aged Out   Meningococcal B Vaccine  Aged Out   Pneumococcal Vaccine: 50+ Years  Discontinued   COVID-19 Vaccine  Discontinued    Health Maintenance  There are no preventive care reminders to display for this patient.  Additional  Screening:  Vision Screening: Recommended annual ophthalmology exams for early detection of glaucoma and other disorders of the eye. Would you like a referral to an eye doctor? No    Dental Screening: Recommended annual dental exams for proper oral hygiene  Community Resource Referral / Chronic Care Management: CRR required this visit?  No   CCM required this visit?  No   Plan:    I have personally reviewed and noted the following in the patient's chart:   Medical and social history Use of alcohol, tobacco or illicit drugs  Current medications and supplements including opioid prescriptions. Patient is not currently taking opioid prescriptions. Functional ability and status Nutritional status Physical activity Advanced directives List of other physicians Hospitalizations, surgeries, and ER visits in previous 12 months Vitals Screenings to include cognitive, depression, and falls Referrals and appointments  In addition, I have reviewed and discussed with patient certain preventive protocols, quality metrics, and best practice recommendations. A written personalized care plan for preventive services as well as general preventive health recommendations were provided to patient.   Lavelle Pfeiffer [place], [place]   [id number]/74/7974   After Visit Summary: (MyChart) Due to this being a telephonic visit, the after visit summary with patients personalized plan was offered to patient via MyChart   Notes: Nothing significant to report at this time.

## 2023-09-30 ENCOUNTER — Other Ambulatory Visit: Payer: Self-pay

## 2023-09-30 DIAGNOSIS — Z79899 Other long term (current) drug therapy: Secondary | ICD-10-CM

## 2023-09-30 DIAGNOSIS — E1169 Type 2 diabetes mellitus with other specified complication: Secondary | ICD-10-CM

## 2023-09-30 DIAGNOSIS — D72829 Elevated white blood cell count, unspecified: Secondary | ICD-10-CM

## 2023-09-30 DIAGNOSIS — E119 Type 2 diabetes mellitus without complications: Secondary | ICD-10-CM

## 2023-10-10 ENCOUNTER — Other Ambulatory Visit: Payer: Self-pay | Admitting: Cardiology

## 2023-10-10 ENCOUNTER — Encounter: Payer: Self-pay | Admitting: Cardiology

## 2023-10-10 ENCOUNTER — Telehealth: Payer: Self-pay | Admitting: *Deleted

## 2023-10-10 MED ORDER — WARFARIN SODIUM 5 MG PO TABS: ORAL_TABLET | ORAL | 1 refills | Status: DC

## 2023-10-10 NOTE — Telephone Encounter (Signed)
 Prescription refill request received for warfarin Lov: Mcdowell 09/25/2023 Next INR check: 8/27 Warfarin tablet strength: 5mg    Refill sent.

## 2023-10-22 DIAGNOSIS — D72829 Elevated white blood cell count, unspecified: Secondary | ICD-10-CM | POA: Diagnosis not present

## 2023-10-22 DIAGNOSIS — E785 Hyperlipidemia, unspecified: Secondary | ICD-10-CM | POA: Diagnosis not present

## 2023-10-22 DIAGNOSIS — E119 Type 2 diabetes mellitus without complications: Secondary | ICD-10-CM | POA: Diagnosis not present

## 2023-10-22 DIAGNOSIS — E1169 Type 2 diabetes mellitus with other specified complication: Secondary | ICD-10-CM | POA: Diagnosis not present

## 2023-10-22 DIAGNOSIS — Z79899 Other long term (current) drug therapy: Secondary | ICD-10-CM | POA: Diagnosis not present

## 2023-10-23 ENCOUNTER — Ambulatory Visit: Payer: Self-pay | Admitting: Family Medicine

## 2023-10-23 LAB — BASIC METABOLIC PANEL WITH GFR
BUN/Creatinine Ratio: 16 (ref 12–28)
BUN: 14 mg/dL (ref 8–27)
CO2: 21 mmol/L (ref 20–29)
Calcium: 9.1 mg/dL (ref 8.7–10.3)
Chloride: 102 mmol/L (ref 96–106)
Creatinine, Ser: 0.89 mg/dL (ref 0.57–1.00)
Glucose: 110 mg/dL — ABNORMAL HIGH (ref 70–99)
Potassium: 4.4 mmol/L (ref 3.5–5.2)
Sodium: 139 mmol/L (ref 134–144)
eGFR: 70 mL/min/1.73 (ref >59)

## 2023-10-23 LAB — CBC WITH DIFFERENTIAL/PLATELET
Basophils Absolute: 0.1 x10E3/uL (ref 0.0–0.2)
Basos: 1 %
EOS (ABSOLUTE): 0.5 x10E3/uL — ABNORMAL HIGH (ref 0.0–0.4)
Eos: 5 %
Hematocrit: 37.2 % (ref 34.0–46.6)
Hemoglobin: 12.6 g/dL (ref 11.1–15.9)
Immature Grans (Abs): 0 x10E3/uL (ref 0.0–0.1)
Immature Granulocytes: 0 %
Lymphocytes Absolute: 1.8 x10E3/uL (ref 0.7–3.1)
Lymphs: 18 %
MCH: 29.8 pg (ref 26.6–33.0)
MCHC: 33.9 g/dL (ref 31.5–35.7)
MCV: 88 fL (ref 79–97)
Monocytes Absolute: 1.1 x10E3/uL — ABNORMAL HIGH (ref 0.1–0.9)
Monocytes: 11 %
Neutrophils Absolute: 6.3 x10E3/uL (ref 1.4–7.0)
Neutrophils: 65 %
Platelets: 355 x10E3/uL (ref 150–450)
RBC: 4.23 x10E6/uL (ref 3.77–5.28)
RDW: 12.4 % (ref 11.7–15.4)
WBC: 9.8 x10E3/uL (ref 3.4–10.8)

## 2023-10-23 LAB — HEMOGLOBIN A1C
Est. average glucose Bld gHb Est-mCnc: 131 mg/dL
Hgb A1c MFr Bld: 6.2 % — ABNORMAL HIGH (ref 4.8–5.6)

## 2023-10-23 LAB — HEPATIC FUNCTION PANEL
ALT: 12 IU/L (ref 0–32)
AST: 17 IU/L (ref 0–40)
Albumin: 4.1 g/dL (ref 3.9–4.9)
Alkaline Phosphatase: 83 IU/L (ref 44–121)
Bilirubin Total: 0.5 mg/dL (ref 0.0–1.2)
Bilirubin, Direct: 0.19 mg/dL (ref 0.00–0.40)
Total Protein: 6.6 g/dL (ref 6.0–8.5)

## 2023-10-23 LAB — LIPID PANEL
Chol/HDL Ratio: 2.7 ratio (ref 0.0–4.4)
Cholesterol, Total: 146 mg/dL (ref 100–199)
HDL: 54 mg/dL (ref >39)
LDL Chol Calc (NIH): 67 mg/dL (ref 0–99)
Triglycerides: 147 mg/dL (ref 0–149)
VLDL Cholesterol Cal: 25 mg/dL (ref 5–40)

## 2023-10-30 ENCOUNTER — Ambulatory Visit: Attending: Cardiology | Admitting: *Deleted

## 2023-10-30 DIAGNOSIS — Z5181 Encounter for therapeutic drug level monitoring: Secondary | ICD-10-CM

## 2023-10-30 DIAGNOSIS — Z952 Presence of prosthetic heart valve: Secondary | ICD-10-CM | POA: Diagnosis not present

## 2023-10-30 LAB — POCT INR: INR: 2.5 (ref 2.0–3.0)

## 2023-10-30 NOTE — Patient Instructions (Signed)
 Continue warfarin 1 tablet daily except 1/2 tablet on Mondays, Wednesdays and Fridays. Recheck in 6 weeks. Keep leafy veggies consistent.

## 2023-10-30 NOTE — Progress Notes (Signed)
 INR 2.5. Please see anticoagulation encounter

## 2023-11-09 ENCOUNTER — Other Ambulatory Visit: Payer: Self-pay | Admitting: Family Medicine

## 2023-11-09 DIAGNOSIS — E119 Type 2 diabetes mellitus without complications: Secondary | ICD-10-CM

## 2023-11-13 ENCOUNTER — Ambulatory Visit: Admitting: Family Medicine

## 2023-11-13 ENCOUNTER — Encounter: Payer: Self-pay | Admitting: Family Medicine

## 2023-11-13 VITALS — BP 124/62 | Temp 97.2°F | Ht 65.0 in | Wt 241.0 lb

## 2023-11-13 DIAGNOSIS — Z794 Long term (current) use of insulin: Secondary | ICD-10-CM | POA: Diagnosis not present

## 2023-11-13 DIAGNOSIS — E1169 Type 2 diabetes mellitus with other specified complication: Secondary | ICD-10-CM | POA: Diagnosis not present

## 2023-11-13 DIAGNOSIS — Z79899 Other long term (current) drug therapy: Secondary | ICD-10-CM

## 2023-11-13 DIAGNOSIS — M545 Low back pain, unspecified: Secondary | ICD-10-CM | POA: Diagnosis not present

## 2023-11-13 DIAGNOSIS — Z23 Encounter for immunization: Secondary | ICD-10-CM | POA: Diagnosis not present

## 2023-11-13 DIAGNOSIS — E119 Type 2 diabetes mellitus without complications: Secondary | ICD-10-CM

## 2023-11-13 DIAGNOSIS — Z7901 Long term (current) use of anticoagulants: Secondary | ICD-10-CM | POA: Diagnosis not present

## 2023-11-13 DIAGNOSIS — E785 Hyperlipidemia, unspecified: Secondary | ICD-10-CM | POA: Diagnosis not present

## 2023-11-13 DIAGNOSIS — I10 Essential (primary) hypertension: Secondary | ICD-10-CM

## 2023-11-13 MED ORDER — AMLODIPINE BESYLATE 10 MG PO TABS: 10.0000 mg | ORAL_TABLET | Freq: Every day | ORAL | 1 refills | Status: AC

## 2023-11-13 MED ORDER — LANTUS SOLOSTAR 100 UNIT/ML ~~LOC~~ SOPN: 40.0000 [IU] | PEN_INJECTOR | Freq: Every day | SUBCUTANEOUS | 3 refills | Status: AC

## 2023-11-13 MED ORDER — PRAVASTATIN SODIUM 20 MG PO TABS: ORAL_TABLET | ORAL | 1 refills | Status: AC

## 2023-11-13 MED ORDER — TRULICITY 3 MG/0.5ML ~~LOC~~ SOAJ: SUBCUTANEOUS | 5 refills | Status: AC

## 2023-11-13 MED ORDER — METOPROLOL TARTRATE 50 MG PO TABS: ORAL_TABLET | ORAL | 1 refills | Status: AC

## 2023-11-13 MED ORDER — METFORMIN HCL ER 500 MG PO TB24: 500.0000 mg | ORAL_TABLET | Freq: Two times a day (BID) | ORAL | 1 refills | Status: AC

## 2023-11-13 MED ORDER — LISINOPRIL 40 MG PO TABS: 40.0000 mg | ORAL_TABLET | Freq: Every day | ORAL | 1 refills | Status: AC

## 2023-11-13 NOTE — Progress Notes (Unsigned)
   Subjective:    Patient ID: [name], female    DOB: 11-Feb-1956, 68 y.o.   MRN: [id number]  HPI 6 month f/u  DM-patient is try to watch her diet to some degree she is has a hard time staying active but stays active with her friends and within her household and outside.  Denies any low sugar spells but does state at times her sugars are on the lower end  Gait instability she has a more difficult time walking around compared to how she used to do she feels that this is partly related to difficulty with balancing and walking with therapy as she gets older  Chronic back pain patient has some back pain to some degree occasionally uses hydrocodone  does not have any radiation down the legs currently  Patient does have underlying heart disease stable recently her cardiologist following things closely will do follow-up echo She Dake takes her medicine on a regular basis denies any shortness of breath or chest pressure  She does relate intermittent pedal edema but not severe    Review of Systems     Objective:   Physical Exam  General-in no acute distress Eyes-no discharge Lungs-respiratory rate normal, CTA CV-no murmurs,RRR Extremities skin warm dry no edema Neuro grossly normal Behavior normal, alert       Assessment & Plan:  1. Immunization due (Primary) Today - Flu vaccine HIGH DOSE PF(Fluzone Trivalent)  2. Type 2 diabetes mellitus not at goal [facility]) Recent A1c looks good no longer using short term insulin  Use of long-term insulin  Not having any true hypoglycemia but glucose is sometimes 80 5 in the morning I told patient if she starts having low spells she needs to reduce the insulin  by at least 4 units and if continued low spells reduce it more and notify us  Continue Trulicity  it is doing her well it is helping her No longer using short acting insulin  She is using long-acting insulin  in the evening 3. Hyperlipidemia associated with type 2 diabetes mellitus  (HCC) Statin use good, healthy diet, and keeping LDL below 70 at times below 55  4. High risk medication use Previous lab work looks good we will do more labs in the spring  5. Essential hypertension Blood pressure good control continue current medicine watch diet avoid excessive salt  6. Lumbar pain Hydrocodone  when necessary uses sparingly caution drowsiness home use only  7. Current use of long term anticoagulation Follows on a regular basis no bleeding issues  8. Morbid obesity (HCC) Portion control regular physical activity  Follow-up 6 months

## 2023-12-11 ENCOUNTER — Ambulatory Visit: Attending: Cardiology | Admitting: *Deleted

## 2023-12-11 DIAGNOSIS — Z5181 Encounter for therapeutic drug level monitoring: Secondary | ICD-10-CM

## 2023-12-11 DIAGNOSIS — Z952 Presence of prosthetic heart valve: Secondary | ICD-10-CM | POA: Diagnosis not present

## 2023-12-11 LAB — POCT INR: INR: 3.3 — AB (ref 2.0–3.0)

## 2023-12-11 NOTE — Progress Notes (Signed)
 INR 3.3; Please see anticoagulation encounter

## 2023-12-11 NOTE — Patient Instructions (Signed)
 Hold warfarin tonight then resume 1 tablet daily except 1/2 tablet on Mondays, Wednesdays and Fridays. Recheck in 6 weeks. Keep leafy veggies consistent.

## 2023-12-18 ENCOUNTER — Other Ambulatory Visit: Payer: Self-pay | Admitting: Cardiology

## 2024-01-01 ENCOUNTER — Encounter: Payer: Self-pay | Admitting: Family Medicine

## 2024-01-01 ENCOUNTER — Ambulatory Visit: Payer: Self-pay

## 2024-01-01 ENCOUNTER — Ambulatory Visit: Admitting: Family Medicine

## 2024-01-01 VITALS — BP 133/74 | HR 78 | Temp 97.7°F | Ht 65.0 in | Wt 238.0 lb

## 2024-01-01 DIAGNOSIS — B353 Tinea pedis: Secondary | ICD-10-CM

## 2024-01-01 DIAGNOSIS — L2084 Intrinsic (allergic) eczema: Secondary | ICD-10-CM | POA: Diagnosis not present

## 2024-01-01 MED ORDER — NYSTATIN-TRIAMCINOLONE 100000-0.1 UNIT/GM-% EX CREA: 1.0000 | TOPICAL_CREAM | Freq: Two times a day (BID) | CUTANEOUS | 0 refills | Status: AC

## 2024-01-01 NOTE — Telephone Encounter (Signed)
 Scheduled same day as pt is diabetic. Last tetanus unknown.  FYI Only or Action Required?: FYI only for provider.  Patient was last seen in primary care on 11/13/2023 by Alphonsa Glendia LABOR, MD.  Called Nurse Triage reporting Burn.  Symptoms began several days ago.  Interventions attempted: Rest, hydration, or home remedies.  Symptoms are: unchanged.  Triage Disposition: See Physician Within 24 Hours  Patient/caregiver understands and will follow disposition?:  Reason for Disposition  [1] Minor thermal burn AND [2] no prior tetanus shots (or is not fully vaccinated)  Answer Assessment - Initial Assessment Questions Pt believes she sustained a burn from a space heater 5 days ago. Had a blister that popped.   1. ONSET: When did it happen? If happened < 3 hours ago, ask: Did you apply cool water ? If not, give First Aid Advice immediately.      5 days  2. LOCATION: Where is the burn located?      Left toes 4 and 5  3. BURN SIZE: How large is the burn?  The palm is roughly 0.5% of the total body surface area (BSA).     *No Answer*  4. SEVERITY OF THE BURN: Are there any blisters? What size are they? (e.g., quarter equals 1 inch or 2.5 cm) Are any of the blisters broken (open or wrinkled)?     Blister broke  5. MECHANISM: Tell me how it happened.     Thermal exposure to space heater  6. PAIN: Are you having any pain? How bad is the pain? (Scale 0-10; or none, mild, moderate, severe)     4-5/10 7. INHALATION INJURY: Were you inside an enclosed space with heat and smoke? If Yes, ask: Do you have any cough or difficulty breathing?     *No Answer* 8. OTHER SYMPTOMS: Do you have any other symptoms? (e.g., headache, nausea)     *No Answer* 9. PREGNANCY: Is there any chance you are pregnant? When was your last menstrual period?     *No Answer*  Protocols used: [employer] Rim Copied from CRM [id number]. Topic: Clinical - Red Word Triage >> Jan 01, 2024   8:17 AM Mia F wrote: Red Word that prompted transfer to Nurse Triage: Possible burn on foot. Thinks she had her foot too close to a space heater. There was a blister that looks like it may have popped. Think she did this on Saturday and treated it with neosporin. Fluid was coming out of it but not anymore. Now it is sore and when she walks on it it is very sore. It gets better the more she walks around but she is unable to tell if it is still leaking fluids.

## 2024-01-01 NOTE — Progress Notes (Signed)
 Subjective:  Patient ID: [name], female    DOB: [birth year]  Age: 68 y.o. MRN: [id number]  CC: Burn (Possible foot on left foot on the bottom. Pt does to have much feeling but reports she does keep a heater close. Started Saturday when she noticed a wet blister had burst. Dry now. /*heart valve and on warfarin)   HPI  Discussed the use of AI scribe software for clinical note transcription with the patient, who gave verbal consent to proceed.  History of Present Illness [name] is a 68 year old female with diabetes who presents with a suspected burn on her foot.  The issue began four days ago with the appearance of a blister on her foot. Initially thought to be a blister, Neosporin was applied and the area was bandaged. The condition worsened, affecting another toe, with some areas appearing dry. Early this morning, she noted stiffness, suspecting a burn, though it only causes slight discomfort in the mornings.  She has diabetes with associated neuropathy, affecting sensation, particularly on the bottom of her foot. She is diligent about wearing shoes even at home and shares her shower only with her granddaughter, who she believes does not have athlete's foot. The affected area does not itch.  She has a history of heart valve issues and is on warfarin. She is concerned about the compatibility of any new medications with her current regimen. Her daughter, a trained manicurist, assists with her foot care, including toenail trimming.          01/01/2024   11:25 AM 11/13/2023    1:16 PM 09/27/2023   10:08 AM  Depression screen PHQ 2/9  Decreased Interest 0 0 0  Down, Depressed, Hopeless 0 0 0  PHQ - 2 Score 0 0 0  Altered sleeping 0 2   Tired, decreased energy 0 0   Change in appetite 0 0   Feeling bad or failure about yourself  0 0   Trouble concentrating 0 0   Moving slowly or fidgety/restless 0 0   Suicidal thoughts 0 0   PHQ-9 Score 0 2   Difficult doing  work/chores Not difficult at all Not difficult at all     History Sunnie has a past medical history of Aortic stenosis, Cataract, Essential hypertension, Heart murmur, Insomnia, Mixed hyperlipidemia, Nocturia, Numbness, Restless leg syndrome, Ruptured disk, and Type 2 diabetes mellitus (HCC).   She has a past surgical history that includes Colonoscopy (2007); Cardiac catheterization (N/A, 12/16/2015); Toe Surgery; Aortic valve replacement (N/A, 01/03/2016); TEE without cardioversion (N/A, 01/03/2016); Colonoscopy with propofol  (N/A, 01/09/2018); polypectomy (01/09/2018); and Cardiac valve replacement.   Her family history includes Cancer in her mother; Coronary artery disease in her father; Diabetes in her brother and mother; Diabetes type II in her brother and mother; Hyperlipidemia in her sister; Hypertension in her father, mother, and sister.She reports that she has never smoked. She has never used smokeless tobacco. She reports that she does not drink alcohol and does not use drugs.    ROS Review of Systems  Constitutional: Negative.   HENT: Negative.    Eyes:  Negative for visual disturbance.  Respiratory:  Negative for shortness of breath.   Cardiovascular:  Negative for chest pain.  Gastrointestinal:  Negative for abdominal pain.  Musculoskeletal:  Negative for arthralgias.    Objective:  BP 133/74   Pulse 78   Temp 97.7 F (36.5 C)   Ht 5' 5 (1.651 m)   Wt 238 lb (  108 kg)   SpO2 97%   BMI 39.61 kg/m   BP Readings from Last 3 Encounters:  01/01/24 133/74  11/13/23 124/62  09/25/23 138/66    Wt Readings from Last 3 Encounters:  01/01/24 238 lb (108 kg)  11/13/23 241 lb (109.3 kg)  09/27/23 239 lb (108.4 kg)     Physical Exam Physical Exam GENERAL: Alert, cooperative, well developed, no acute distress. HEENT: Normocephalic, normal oropharynx, moist mucous membranes. CHEST: Clear to auscultation bilaterally, no wheezes, rhonchi, or crackles. CARDIOVASCULAR:  Normal heart rate and rhythm, S1 and S2 normal without murmurs. ABDOMEN: Soft, non-tender, non-distended, without organomegaly, normal bowel sounds. EXTREMITIES: No cyanosis or edema. NEUROLOGICAL: Cranial nerves grossly intact, moves all extremities without gross motor deficit. Decreased sensation on the plantar surface of the foot. SKIN: Dry skin with scaliness on the foot. Minimal hyperemia at the base of the 2 affected toes   Assessment & Plan:  Intrinsic eczema  Tinea pedis of left foot  Other orders -     Nystatin-Triamcinolone ; Apply 1 Application topically 2 (two) times daily.  Dispense: 30 g; Refill: 0    Assessment and Plan Assessment & Plan Tinea pedis and intrinsic eczema of the foot   Symptoms include redness and dry skin for four days. Differential diagnosis considered athlete's foot and a burn, but lack of itching and presence of diabetic neuropathy led to the conclusion of a fungal infection with eczema. The condition is inconsistent with a burn due to lack of pain and spreading nature of symptoms. Prescribe Mycolog cream (triamcinolone  with nystatin) to be applied twice daily. Reassess in one week if symptoms do not improve, with follow-up by Dr. Willena or Dr. Zollie if necessary.  Type 2 diabetes mellitus with diabetic neuropathy   Type 2 diabetes mellitus with associated diabetic neuropathy contributes to reduced sensation in the feet, complicating assessment of foot conditions as it may mask symptoms such as itching or pain. Recommend routine podiatry care to monitor foot health due to diabetic neuropathy. Discuss with Dr. Shannan about the potential need for podiatry follow-up.       Follow-up: as needed  Butler Zollie, M.D.

## 2024-01-15 ENCOUNTER — Telehealth: Payer: Self-pay

## 2024-01-15 NOTE — Telephone Encounter (Signed)
 Copied from CRM #[id number]. Topic: Clinical - Prescription Issue >> Jan 15, 2024  9:31 AM Wess RAMAN wrote: Reason for CRM: Pharmacy stated they have discontinued the Continuous Glucose Sensor (FREESTYLE LIBRE 3 SENSOR) MISC. They need a prescription for the Continuous Glucose Sensor (FREESTYLE LIBRE 3 PLUS

## 2024-01-16 ENCOUNTER — Other Ambulatory Visit: Payer: Self-pay

## 2024-01-16 MED ORDER — FREESTYLE LIBRE 3 PLUS SENSOR MISC: 12 refills | Status: DC

## 2024-01-16 NOTE — Telephone Encounter (Signed)
 Freestyle libre 3 plus sent to the pharmacy.

## 2024-01-22 ENCOUNTER — Ambulatory Visit: Attending: Cardiology | Admitting: *Deleted

## 2024-01-22 DIAGNOSIS — Z5181 Encounter for therapeutic drug level monitoring: Secondary | ICD-10-CM | POA: Diagnosis not present

## 2024-01-22 DIAGNOSIS — Z952 Presence of prosthetic heart valve: Secondary | ICD-10-CM | POA: Diagnosis not present

## 2024-01-22 LAB — POCT INR: INR: 2.4 (ref 2.0–3.0)

## 2024-01-22 NOTE — Progress Notes (Signed)
 INR 2.4 Please see anticoagulation encounter

## 2024-01-22 NOTE — Patient Instructions (Signed)
 Continue warfarin 1 tablet daily except 1/2 tablet on Mondays, Wednesdays and Fridays. Recheck in 6 weeks. Keep leafy veggies consistent.

## 2024-02-02 ENCOUNTER — Other Ambulatory Visit: Payer: Self-pay | Admitting: Family Medicine

## 2024-02-02 DIAGNOSIS — E119 Type 2 diabetes mellitus without complications: Secondary | ICD-10-CM

## 2024-02-11 ENCOUNTER — Ambulatory Visit: Admitting: Family Medicine

## 2024-02-11 ENCOUNTER — Ambulatory Visit: Payer: Self-pay

## 2024-02-11 ENCOUNTER — Encounter: Payer: Self-pay | Admitting: Cardiology

## 2024-02-11 VITALS — BP 149/82 | HR 91 | Temp 97.2°F | Ht 65.0 in | Wt 239.0 lb

## 2024-02-11 DIAGNOSIS — L089 Local infection of the skin and subcutaneous tissue, unspecified: Secondary | ICD-10-CM | POA: Insufficient documentation

## 2024-02-11 MED ORDER — AMOXICILLIN-POT CLAVULANATE 875-125 MG PO TABS: 1.0000 | ORAL_TABLET | Freq: Two times a day (BID) | ORAL | 0 refills | Status: AC

## 2024-02-11 MED ORDER — MUPIROCIN 2 % EX OINT: 1.0000 | TOPICAL_OINTMENT | Freq: Two times a day (BID) | CUTANEOUS | 0 refills | Status: AC

## 2024-02-11 MED ORDER — DOXYCYCLINE HYCLATE 100 MG PO TABS: 100.0000 mg | ORAL_TABLET | Freq: Two times a day (BID) | ORAL | 0 refills | Status: AC

## 2024-02-11 NOTE — Patient Instructions (Signed)
 Medications as prescribed.  Recommend recheck in 1 week.  If you worsen, go to the hospital.  Call for INR check.

## 2024-02-11 NOTE — Telephone Encounter (Signed)
 FYI Only or Action Required?: FYI only for provider: appointment scheduled on 12/9.  Patient was last seen in primary care on 01/01/2024 by Zollie Lowers, MD.  Called Nurse Triage reporting Open Wound.  Symptoms began a week ago.  Interventions attempted: Prescription medications: Nystatin  Cream, triamcinolone .  Symptoms are: unchanged.  Triage Disposition: See Physician Within 24 Hours  Patient/caregiver understands and will follow disposition?: Yes                         Copied from CRM #[id number]. Topic: Clinical - Red Word Triage >> Feb 11, 2024 10:15 AM Tinnie BROCKS wrote: Red Word that prompted transfer to Nurse Triage: rt foot fungus with open sore, diabetic Reason for Disposition  Diabetes mellitus  Answer Assessment - Initial Assessment Questions 1. APPEARANCE of RASH: What does the rash look like?      4th toe is red, patient is unable to see foot clearly, but stated it looks like an ulcer  2. LOCATION: Which part of the foot is involved? Are both feet involved?      Right foot   3. SIZE: How large is the infected area? (Inches or centimeters)      Inches   4. ONSET: When did the rash start?     X 1 week   5. OTHER SYMPTOMS: Do you have any other symptoms? (e.g., fever) No other symptoms noted.    Patient called in to triage with complaints of possible athletes foot, as well as an open wound on her right foot. This has been ongoing for 3 days.   The patient stated she had athletes foot x 1 month ago, she was treated. Symptoms now have returned on right foot. She has an open wound between 3rd and forth toe.  For home care, the patient is using Nystatin  Cream, triamcinolone   Appointment scheduled for further evaluation; and agrees with the plan of care, and will reach out if symptoms worsen or persist.  Protocols used: Athlete's Foot-A-AH

## 2024-02-11 NOTE — Progress Notes (Signed)
   Subjective:  Patient ID: [name], female    DOB: 1955-07-20  Age: 68 y.o. MRN: [id number]  CC:   Chief Complaint  Patient presents with   foot fungus    Right foot, tx last week for L foot fungus was given cream, which same cream is not helping right foot    HPI:  68 year old female with type 2 diabetes with neuropathy presents for evaluation of the above.  Patient reports that she has an area of infection to the right 3rd and 4th toes.  She thought that this was tinea pedis.  She was treated previously for tinea pedis in October.  However, this was of the left foot.  She states that she has been using topical medication.  No fever.  No relieving factors.   Review of Systems Per HPI  Objective:  BP (!) 149/82   Pulse 91   Temp (!) 97.2 F (36.2 C)   Ht 5' 5 (1.651 m)   Wt 239 lb (108.4 kg)   SpO2 98%   BMI 39.77 kg/m      02/11/2024    1:51 PM 01/01/2024   11:24 AM 11/13/2023    1:07 PM  BP/Weight  Systolic BP 149 133 124  Diastolic BP 82 74 62  Wt. (Lbs) 239 238 241  BMI 39.77 kg/m2 39.61 kg/m2 40.1 kg/m2    Physical Exam Vitals and nursing note reviewed.  Constitutional:      Appearance: Normal appearance. She is obese.  Pulmonary:     Effort: Pulmonary effort is normal. No respiratory distress.  Musculoskeletal:       Feet:  Feet:     Comments: Dorsum of the right 3rd and 4th toes with erythema.  In between the toes there are 2 small superficial wounds. Neurological:     Mental Status: She is alert.     Lab Results  Component Value Date   WBC 9.8 10/22/2023   HGB 12.6 10/22/2023   HCT 37.2 10/22/2023   PLT 355 10/22/2023   GLUCOSE 110 (H) 10/22/2023   CHOL 146 10/22/2023   TRIG 147 10/22/2023   HDL 54 10/22/2023   LDLCALC 67 10/22/2023   ALT 12 10/22/2023   AST 17 10/22/2023   NA 139 10/22/2023   K 4.4 10/22/2023   CL 102 10/22/2023   CREATININE 0.89 10/22/2023   BUN 14 10/22/2023   CO2 21 10/22/2023   TSH 1.958 09/14/2013    INR 2.4 01/22/2024   HGBA1C 6.2 (H) 10/22/2023   MICROALBUR 0.50 09/14/2013     Assessment & Plan:  Diabetic foot infection (HCC) Assessment & Plan: Starting on Augmentin , doxycycline , and mupirocin .  Needs recheck in 1 week.  Advised to schedule an appointment with Coumadin  clinic for a sooner recheck  Orders: -     Amoxicillin -Pot Clavulanate; Take 1 tablet by mouth 2 (two) times daily.  Dispense: 14 tablet; Refill: 0 -     Doxycycline  Hyclate; Take 1 tablet (100 mg total) by mouth 2 (two) times daily.  Dispense: 14 tablet; Refill: 0 -     Mupirocin ; Apply 1 Application topically 2 (two) times daily for 7 days.  Dispense: 30 g; Refill: 0    Follow-up: 1 week  Zissel Biederman Bluford DO [facility]

## 2024-02-11 NOTE — Assessment & Plan Note (Addendum)
 Starting on Augmentin , doxycycline , and mupirocin .  Needs recheck in 1 week.  Advised to schedule an appointment with Coumadin  clinic for a sooner recheck

## 2024-02-18 ENCOUNTER — Encounter: Payer: Self-pay | Admitting: Family Medicine

## 2024-02-18 ENCOUNTER — Ambulatory Visit: Attending: Cardiology

## 2024-02-18 ENCOUNTER — Ambulatory Visit: Admitting: Family Medicine

## 2024-02-18 VITALS — BP 129/74 | HR 88 | Temp 97.2°F | Ht 65.0 in | Wt 238.0 lb

## 2024-02-18 DIAGNOSIS — S91301A Unspecified open wound, right foot, initial encounter: Secondary | ICD-10-CM | POA: Insufficient documentation

## 2024-02-18 DIAGNOSIS — Z5181 Encounter for therapeutic drug level monitoring: Secondary | ICD-10-CM

## 2024-02-18 DIAGNOSIS — Z952 Presence of prosthetic heart valve: Secondary | ICD-10-CM

## 2024-02-18 LAB — POCT INR: INR: 2.5 (ref 2.0–3.0)

## 2024-02-18 NOTE — Patient Instructions (Signed)
 Finished Abx yesterday. Restart warfarin 1 tablet daily except 1/2 tablet on Mondays, Wednesdays and Fridays. Recheck in 6 weeks. Keep leafy veggies consistent.

## 2024-02-18 NOTE — Assessment & Plan Note (Signed)
 Discussed case with PCP.  Referring to wound care.

## 2024-02-18 NOTE — Progress Notes (Signed)
 Subjective:  Patient ID: [name], female    DOB: [birth year]  Age: 68 y.o. MRN: [id number]  CC:   Chief Complaint  Patient presents with   right foot infection follow up    Reports redness on and between her right foot toes has not spread, pain at times w/ touching, no fevers, chills reported    HPI:  68 year old female presents for evaluation of the above.  Patient has completed antibiotic therapy.  She is here for reassessment.  Still has erythema and has an open wound.  Will discuss referral to wound care.  No fever.  Patient Active Problem List   Diagnosis Date Noted   Wound of right foot 02/18/2024   Morbid obesity (HCC) 02/11/2024   Diabetic foot infection (HCC) 02/11/2024   Aortic aneurysm without rupture, unspecified portion of aorta 03/27/2021   Hyperlipidemia associated with type 2 diabetes mellitus (HCC) 12/08/2019   Encounter for therapeutic drug monitoring 01/12/2016   S/P AVR 01/03/2016   Insomnia 06/15/2015   Thyroid  nodule 09/15/2012   Renal insufficiency 09/15/2012   Type 2 diabetes mellitus not at goal [facility])    Hypertension    Restless leg syndrome    Ruptured disk     Social Hx   Social History   Socioeconomic History   Marital status: Widowed    Spouse name: Not on file   Number of children: 3   Years of education: Not on file   Highest education level: Some college, no degree  Occupational History   Occupation: Diplomatic Services Operational Officer    Comment: Photographer: [employer]  Tobacco Use   Smoking status: Never   Smokeless tobacco: Never  Vaping Use   Vaping status: Never Used  Substance and Sexual Activity   Alcohol use: No   Drug use: No   Sexual activity: Not Currently    Birth control/protection: Abstinence  Other Topics Concern   Not on file  Social History Narrative   2 daughters, 1 son.   4 grandchildren.   Widowed since 2021.   Social Drivers of Health   Tobacco Use: Low Risk (02/18/2024)   Patient History     Smoking Tobacco Use: Never    Smokeless Tobacco Use: Never    Passive Exposure: Not on file  Financial Resource Strain: Low Risk (02/11/2024)   Overall Financial Resource Strain (CARDIA)    Difficulty of Paying Living Expenses: Not hard at all  Food Insecurity: No Food Insecurity (02/11/2024)   Epic    Worried About Programme Researcher, Broadcasting/film/video in the Last Year: Never true    Ran Out of Food in the Last Year: Never true  Transportation Needs: No Transportation Needs (02/11/2024)   Epic    Lack of Transportation (Medical): No    Lack of Transportation (Non-Medical): No  Physical Activity: Insufficiently Active (02/11/2024)   Exercise Vital Sign    Days of Exercise per Week: 1 day    Minutes of Exercise per Session: 10 min  Stress: No Stress Concern Present (02/11/2024)   [employer] of Occupational Health - Occupational Stress Questionnaire    Feeling of Stress: Not at all  Social Connections: Moderately Integrated (02/11/2024)   Social Connection and Isolation Panel    Frequency of Communication with Friends and Family: More than three times a week    Frequency of Social Gatherings with Friends and Family: More than three times a week    Attends Religious Services: More than 4 times  per year    Active Member of Clubs or Organizations: Yes    Attends Banker Meetings: More than 4 times per year    Marital Status: Widowed  Depression (PHQ2-9): Low Risk (01/01/2024)   Depression (PHQ2-9)    PHQ-2 Score: 0  Alcohol Screen: Low Risk (09/23/2023)   Alcohol Screen    Last Alcohol Screening Score (AUDIT): 0  Housing: Low Risk (02/11/2024)   Epic    Unable to Pay for Housing in the Last Year: No    Number of Times Moved in the Last Year: 0    Homeless in the Last Year: No  Utilities: Not At Risk (09/27/2023)   Epic    Threatened with loss of utilities: No  Health Literacy: Adequate Health Literacy (09/27/2023)   B1300 Health Literacy    Frequency of need for help with medical  instructions: Never    Review of Systems Per HPI  Objective:  BP 129/74   Pulse 88   Temp (!) 97.2 F (36.2 C)   Ht 5' 5 (1.651 m)   Wt 238 lb (108 kg)   SpO2 97%   BMI 39.61 kg/m      02/18/2024   10:16 AM 02/11/2024    1:51 PM 01/01/2024   11:24 AM  BP/Weight  Systolic BP 129 149 133  Diastolic BP 74 82 74  Wt. (Lbs) 238 239 238  BMI 39.61 kg/m2 39.77 kg/m2 39.61 kg/m2    Physical Exam Constitutional:      General: She is not in acute distress.    Appearance: Normal appearance.  HENT:     Head: Normocephalic and atraumatic.  Pulmonary:     Effort: Pulmonary effort is normal. No respiratory distress.  Musculoskeletal:       Feet:  Feet:     Comments: Open wound noted at the level location.  Mild surrounding erythema.  No warmth.  See picture. Neurological:     Mental Status: She is alert.     Lab Results  Component Value Date   WBC 9.8 10/22/2023   HGB 12.6 10/22/2023   HCT 37.2 10/22/2023   PLT 355 10/22/2023   GLUCOSE 110 (H) 10/22/2023   CHOL 146 10/22/2023   TRIG 147 10/22/2023   HDL 54 10/22/2023   LDLCALC 67 10/22/2023   ALT 12 10/22/2023   AST 17 10/22/2023   NA 139 10/22/2023   K 4.4 10/22/2023   CL 102 10/22/2023   CREATININE 0.89 10/22/2023   BUN 14 10/22/2023   CO2 21 10/22/2023   TSH 1.958 09/14/2013   INR 2.4 01/22/2024   HGBA1C 6.2 (H) 10/22/2023   MICROALBUR 0.50 09/14/2013     Assessment & Plan:  Wound of right foot Assessment & Plan: Discussed case with PCP.  Referring to wound care.  Orders: -     AMB referral to wound care center   Follow-up: Has follow-up scheduled with PCP.  Jacqulyn Ahle DO [facility]

## 2024-02-18 NOTE — Patient Instructions (Signed)
 Keep the area clean.  Referral placed.  Take care  Dr. Bluford

## 2024-02-18 NOTE — Progress Notes (Signed)
 INR 2.5. Please see anticoagulation encounter

## 2024-02-23 ENCOUNTER — Other Ambulatory Visit: Payer: Self-pay | Admitting: Cardiology

## 2024-02-24 NOTE — Telephone Encounter (Signed)
 Refill request for warfarin:  Last INR was 2.5 on 02/18/24 Next INR due 04/01/24 LOV was 09/25/23  Refill approved.

## 2024-03-04 ENCOUNTER — Ambulatory Visit

## 2024-03-13 ENCOUNTER — Encounter (HOSPITAL_BASED_OUTPATIENT_CLINIC_OR_DEPARTMENT_OTHER): Attending: General Surgery | Admitting: General Surgery

## 2024-03-13 DIAGNOSIS — L97511 Non-pressure chronic ulcer of other part of right foot limited to breakdown of skin: Secondary | ICD-10-CM | POA: Diagnosis not present

## 2024-03-13 DIAGNOSIS — L97512 Non-pressure chronic ulcer of other part of right foot with fat layer exposed: Secondary | ICD-10-CM | POA: Insufficient documentation

## 2024-03-13 DIAGNOSIS — L97516 Non-pressure chronic ulcer of other part of right foot with bone involvement without evidence of necrosis: Secondary | ICD-10-CM | POA: Insufficient documentation

## 2024-03-13 DIAGNOSIS — E11621 Type 2 diabetes mellitus with foot ulcer: Secondary | ICD-10-CM | POA: Insufficient documentation

## 2024-03-18 ENCOUNTER — Encounter (HOSPITAL_BASED_OUTPATIENT_CLINIC_OR_DEPARTMENT_OTHER): Admitting: General Surgery

## 2024-03-18 DIAGNOSIS — E11621 Type 2 diabetes mellitus with foot ulcer: Secondary | ICD-10-CM | POA: Diagnosis not present

## 2024-03-24 ENCOUNTER — Other Ambulatory Visit (HOSPITAL_COMMUNITY): Payer: Self-pay

## 2024-03-24 ENCOUNTER — Telehealth: Payer: Self-pay | Admitting: Pharmacy Technician

## 2024-03-24 NOTE — Telephone Encounter (Signed)
 Pharmacy Patient Advocate Encounter   Received notification from [facility] KEY that prior authorization for [facility] 3 plus is required/requested.   Insurance verification completed.   The patient is insured through [employer].   Per test claim: PA required; PA started via CoverMyMeds. KEY [id number] . Waiting for clinical questions to populate.

## 2024-03-26 ENCOUNTER — Encounter (HOSPITAL_BASED_OUTPATIENT_CLINIC_OR_DEPARTMENT_OTHER): Admitting: General Surgery

## 2024-03-26 ENCOUNTER — Other Ambulatory Visit (HOSPITAL_COMMUNITY): Payer: Self-pay

## 2024-03-26 ENCOUNTER — Encounter: Payer: Self-pay | Admitting: Cardiology

## 2024-03-26 DIAGNOSIS — E11621 Type 2 diabetes mellitus with foot ulcer: Secondary | ICD-10-CM | POA: Diagnosis not present

## 2024-03-26 NOTE — Telephone Encounter (Addendum)
 Pharmacy Patient Advocate Encounter  Received notification from [employer] that Prior Authorization for [facility] 3 Plus has been APPROVED from 03/05/24 to 03/04/25 filled 03/25/24  PA #/Case ID/Reference #: [id number]

## 2024-03-27 ENCOUNTER — Other Ambulatory Visit (HOSPITAL_COMMUNITY): Payer: Self-pay

## 2024-03-30 ENCOUNTER — Other Ambulatory Visit (HOSPITAL_COMMUNITY): Payer: Self-pay

## 2024-04-01 ENCOUNTER — Ambulatory Visit: Attending: Cardiology | Admitting: *Deleted

## 2024-04-01 DIAGNOSIS — Z952 Presence of prosthetic heart valve: Secondary | ICD-10-CM

## 2024-04-01 DIAGNOSIS — Z5181 Encounter for therapeutic drug level monitoring: Secondary | ICD-10-CM

## 2024-04-01 LAB — POCT INR: INR: 1.9 — AB (ref 2.0–3.0)

## 2024-04-01 NOTE — Progress Notes (Signed)
 INR 1.9

## 2024-04-01 NOTE — Patient Instructions (Signed)
 Started levaquin 750mg  daily on 1/27 x 10 days Decrease warfarin to 1/2 tablet daily except 1 tablet on Sundays, Tuesdays and Thursdays Recheck in 1 week

## 2024-04-02 ENCOUNTER — Encounter (HOSPITAL_BASED_OUTPATIENT_CLINIC_OR_DEPARTMENT_OTHER): Admitting: General Surgery

## 2024-04-02 ENCOUNTER — Other Ambulatory Visit: Payer: Self-pay

## 2024-04-02 ENCOUNTER — Other Ambulatory Visit (HOSPITAL_COMMUNITY): Payer: Self-pay | Admitting: General Surgery

## 2024-04-02 ENCOUNTER — Ambulatory Visit (HOSPITAL_COMMUNITY)
Admission: RE | Admit: 2024-04-02 | Discharge: 2024-04-02 | Disposition: A | Discharge status: Home / Self Care | Source: Ambulatory Visit | Attending: General Surgery | Admitting: General Surgery

## 2024-04-02 ENCOUNTER — Telehealth: Payer: Self-pay | Admitting: Family Medicine

## 2024-04-02 DIAGNOSIS — M869 Osteomyelitis, unspecified: Secondary | ICD-10-CM | POA: Diagnosis present

## 2024-04-02 DIAGNOSIS — E11621 Type 2 diabetes mellitus with foot ulcer: Secondary | ICD-10-CM | POA: Diagnosis not present

## 2024-04-02 DIAGNOSIS — E119 Type 2 diabetes mellitus without complications: Secondary | ICD-10-CM

## 2024-04-02 MED ORDER — SURE COMFORT PEN NEEDLES 31G X 5 MM MISC: 5 refills | Status: AC

## 2024-04-02 NOTE — Telephone Encounter (Signed)
 Refill Ultra-fine Pen needles 31G  [facility]

## 2024-04-08 ENCOUNTER — Ambulatory Visit: Admitting: *Deleted

## 2024-04-08 DIAGNOSIS — Z5181 Encounter for therapeutic drug level monitoring: Secondary | ICD-10-CM

## 2024-04-08 DIAGNOSIS — Z952 Presence of prosthetic heart valve: Secondary | ICD-10-CM | POA: Diagnosis not present

## 2024-04-08 LAB — POCT INR: INR: 2.5 (ref 2.0–3.0)

## 2024-04-08 NOTE — Patient Instructions (Signed)
 Started levaquin 750mg  daily on 1/27 x 10 days.  States MD is going to continue for a while longer for foot infection. Continue warfarin 1/2 tablet daily except 1 tablet on Sundays, Tuesdays and Thursdays Recheck in 1 week

## 2024-04-08 NOTE — Progress Notes (Signed)
 INR 2.5

## 2024-04-09 ENCOUNTER — Encounter (HOSPITAL_BASED_OUTPATIENT_CLINIC_OR_DEPARTMENT_OTHER): Admitting: General Surgery

## 2024-04-10 ENCOUNTER — Other Ambulatory Visit (HOSPITAL_COMMUNITY): Payer: Self-pay | Admitting: General Surgery

## 2024-04-10 DIAGNOSIS — L97516 Non-pressure chronic ulcer of other part of right foot with bone involvement without evidence of necrosis: Secondary | ICD-10-CM

## 2024-04-12 ENCOUNTER — Ambulatory Visit (HOSPITAL_COMMUNITY)

## 2024-04-14 ENCOUNTER — Ambulatory Visit

## 2024-04-15 ENCOUNTER — Ambulatory Visit

## 2024-04-16 ENCOUNTER — Encounter (HOSPITAL_BASED_OUTPATIENT_CLINIC_OR_DEPARTMENT_OTHER): Admitting: General Surgery

## 2024-04-22 ENCOUNTER — Ambulatory Visit: Payer: Self-pay | Admitting: Family Medicine

## 2024-04-23 ENCOUNTER — Encounter (HOSPITAL_BASED_OUTPATIENT_CLINIC_OR_DEPARTMENT_OTHER): Admitting: General Surgery

## 2024-04-30 ENCOUNTER — Encounter (HOSPITAL_BASED_OUTPATIENT_CLINIC_OR_DEPARTMENT_OTHER): Admitting: General Surgery

## 2024-05-21 ENCOUNTER — Ambulatory Visit: Admitting: Family Medicine

## 2024-10-02 ENCOUNTER — Ambulatory Visit
